# Patient Record
Sex: Male | Born: 1965 | Race: White | Hispanic: No | Smoking: Current every day smoker
Health system: Southern US, Community
[De-identification: ages and names within clinical notes are randomized; demographics above are authoritative.]

## PROBLEM LIST (undated history)

## (undated) DIAGNOSIS — F410 Panic disorder [episodic paroxysmal anxiety] without agoraphobia: Secondary | ICD-10-CM

## (undated) DIAGNOSIS — D751 Secondary polycythemia: Secondary | ICD-10-CM

## (undated) DIAGNOSIS — K219 Gastro-esophageal reflux disease without esophagitis: Secondary | ICD-10-CM

## (undated) DIAGNOSIS — I38 Endocarditis, valve unspecified: Secondary | ICD-10-CM

## (undated) DIAGNOSIS — J189 Pneumonia, unspecified organism: Secondary | ICD-10-CM

## (undated) HISTORY — PX: SKIN SURGERY: SHX2413

## (undated) HISTORY — PX: HERNIA REPAIR: SHX51

## (undated) HISTORY — PX: TONSILLECTOMY AND ADENOIDECTOMY: SUR1326

## (undated) HISTORY — DX: Endocarditis, valve unspecified: I38

## (undated) HISTORY — PX: TUMOR REMOVAL: SHX12

---
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
## 2007-07-15 ENCOUNTER — Emergency Department: Payer: Self-pay | Admitting: Emergency Medicine

## 2008-04-27 ENCOUNTER — Emergency Department: Payer: Self-pay | Admitting: Emergency Medicine

## 2009-09-23 ENCOUNTER — Ambulatory Visit: Payer: Self-pay | Admitting: Internal Medicine

## 2009-09-25 ENCOUNTER — Ambulatory Visit: Payer: Self-pay | Admitting: Internal Medicine

## 2010-11-04 ENCOUNTER — Ambulatory Visit: Payer: Self-pay | Admitting: Family Medicine

## 2014-10-03 ENCOUNTER — Ambulatory Visit: Payer: Self-pay | Admitting: Family Medicine

## 2015-04-12 ENCOUNTER — Encounter: Payer: Self-pay | Admitting: Gynecology

## 2015-04-12 ENCOUNTER — Ambulatory Visit
Admission: EM | Admit: 2015-04-12 | Discharge: 2015-04-12 | Disposition: A | Discharge status: Home / Self Care | Payer: BC Managed Care – PPO | Attending: Family Medicine | Admitting: Family Medicine

## 2015-04-12 DIAGNOSIS — I776 Arteritis, unspecified: Secondary | ICD-10-CM

## 2015-04-12 DIAGNOSIS — M2550 Pain in unspecified joint: Secondary | ICD-10-CM | POA: Diagnosis not present

## 2015-04-12 DIAGNOSIS — R21 Rash and other nonspecific skin eruption: Secondary | ICD-10-CM | POA: Diagnosis not present

## 2015-04-12 HISTORY — DX: Gastro-esophageal reflux disease without esophagitis: K21.9

## 2015-04-12 LAB — CBC WITH DIFFERENTIAL/PLATELET
Basophils Absolute: 0 10*3/uL (ref 0–0.1)
Basophils Relative: 1 %
EOS ABS: 0.5 10*3/uL (ref 0–0.7)
EOS PCT: 6 %
HCT: 51.5 % (ref 40.0–52.0)
Hemoglobin: 17.8 g/dL (ref 13.0–18.0)
LYMPHS ABS: 1.3 10*3/uL (ref 1.0–3.6)
LYMPHS PCT: 17 %
MCH: 30.9 pg (ref 26.0–34.0)
MCHC: 34.6 g/dL (ref 32.0–36.0)
MCV: 89.3 fL (ref 80.0–100.0)
MONO ABS: 0.5 10*3/uL (ref 0.2–1.0)
MONOS PCT: 7 %
Neutro Abs: 5.2 10*3/uL (ref 1.4–6.5)
Neutrophils Relative %: 69 %
PLATELETS: 349 10*3/uL (ref 150–440)
RBC: 5.76 MIL/uL (ref 4.40–5.90)
RDW: 13 % (ref 11.5–14.5)
WBC: 7.5 10*3/uL (ref 3.8–10.6)

## 2015-04-12 LAB — SEDIMENTATION RATE: SED RATE: 1 mm/h (ref 0–15)

## 2015-04-12 MED ORDER — PREDNISONE 10 MG (21) PO TBPK
ORAL_TABLET | ORAL | Status: DC
Start: 2015-04-12 — End: 2015-09-24

## 2015-04-12 MED ORDER — LORATADINE 10 MG PO TABS: 10.0000 mg | ORAL_TABLET | Freq: Every day | ORAL | Status: DC

## 2015-04-12 NOTE — ED Notes (Signed)
Patient c/o bilateral palm of hands itching x yesterday. Pt. Also stated joints pain in thumbs. Pt. C/o x 2 weeks ago had rash on arms which has disappear.

## 2015-04-12 NOTE — Discharge Instructions (Signed)
Hives Hives are itchy, red, puffy (swollen) areas of the skin. Hives can change in size and location on your body. Hives can come and go for hours, days, or weeks. Hives do not spread from person to person (noncontagious). Scratching, exercise, and stress can make your hives worse. HOME CARE  Avoid things that cause your hives (triggers).  Take antihistamine medicines as told by your doctor. Do not drive while taking an antihistamine.  Take any other medicines for itching as told by your doctor.  Wear loose-fitting clothing.  Keep all doctor visits as told. GET HELP RIGHT AWAY IF:   You have a fever.  Your tongue or lips are puffy.  You have trouble breathing or swallowing.  You feel tightness in the throat or chest.  You have belly (abdominal) pain.  You have lasting or severe itching that is not helped by medicine.  You have painful or puffy joints. These problems may be the first sign of a life-threatening allergic reaction. Call your local emergency services (911 in [place] MAKE SURE YOU:   Understand these instructions.  Will watch your condition.  Will get help right away if you are not doing well or get worse. Document Released: 04/27/2008 Document Revised: 01/18/2012 Document Reviewed: 10/12/2011 [facility] Patient Information 2015 [place], [place]. This information is not intended to replace advice given to you by your health care provider. Make sure you discuss any questions you have with your health care provider.  Arthralgia Arthralgia is joint pain. A joint is a place where two bones meet. Joint pain can happen for many reasons. The joint can be bruised, stiff, infected, or weak from aging. Pain usually goes away after resting and taking medicine for soreness.  HOME CARE  Rest the joint as told by your doctor.  Keep the sore joint raised (elevated) for the first 24 hours.  Put ice on the joint area.  Put ice in a plastic bag.  Place a towel between your  skin and the bag.  Leave the ice on for 15-20 minutes, 03-04 times a day.  Wear your splint, casting, elastic bandage, or sling as told by your doctor.  Only take medicine as told by your doctor. Do not take aspirin.  Use crutches as told by your doctor. Do not put weight on the joint until told to by your doctor. GET HELP RIGHT AWAY IF:   You have bruising, puffiness (swelling), or more pain.  Your fingers or toes turn blue or start to lose feeling (numb).  Your medicine does not lessen the pain.  Your pain becomes severe.  You have a temperature by mouth above 102 F (38.9 C), not controlled by medicine.  You cannot move or use the joint. MAKE SURE YOU:   Understand these instructions.  Will watch your condition.  Will get help right away if you are not doing well or get worse. Document Released: 07/07/2009 Document Revised: 10/11/2011 Document Reviewed: 07/07/2009 [facility] Patient Information 2015 [place], [place]. This information is not intended to replace advice given to you by your health care provider. Make sure you discuss any questions you have with your health care provider.  Vasculitis Vasculitis is when your blood vessels are inflamed. There are many different blood vessels in the body, and vasculitis can affect any of them. This includes large (veins and arteries) and small (capillaries) vessels. With vasculitis,   Blood vessel walls can become thick.  Blood vessels can become narrow.  Blood vessels can become weak. Sometimes, it  becomes so weak that the blood vessel bulges out like a balloon. This is called an aneurysm. Aneurysms are rare but can be life-threatening.  Scarring can occur.  Not enough blood can flow through the blood vessels. All of these things can damage many parts of the body, including the muscles, kidneys, lungs and brain. There are many types of vasculitis. Some types are short-term (acute), while others are long-term (chronic). Some  types may go away without treatment, and others may need to be treated for a long time.  CAUSES  Vasculitis occurs when the body's immune system (which fights germs and disease) makes a mistake. It attacks its own blood vessels. This causes inflammation (the body's way of reacting to injury or infection).   Why this happens is usually not known. The condition is then called primary vasculitis.  Sometimes, something triggers the inflammation. This is called secondary vasculitis. Possible causes include:  Infections.  An immune system disease. Examples include lupus, rheumatoid arthritis and scleroderma.  An allergic reaction to a medicine.  Cancer that affects blood cells. This includes leukemia and lymphoma.  Males and females of all ages and races can develop vasculitis. Some risk factors make vasculitis more likely. These include:  Smoking.  Stress.  Physical injury. SYMPTOMS  There are more than 20 types of vasculitis. Symptoms of each type vary, but some symptoms are common.  Many people with vasculitis:  Have a fever.  Do not feel like eating.  Lose weight.  Feel very tired.  Have aches and pains.  Feel weak.  Start to not have feeling (numbness) in an area.  Symptoms for some types of vasculitis also could be:  Sores in the mouth or eyes.  Skin problems. This could be sores, spots or rashes.  Trouble seeing.  Trouble breathing.  Blood in the urine.  Headaches.  Pain in the abdomen.  Stuffy or bloody nose. DIAGNOSIS  Vasculitis symptoms are similar to symptoms of many other conditions. That can make it hard to tell if you have vasculitis. To be sure, your caregiver will ask about your symptoms and do a physical exam. Certain tests may be necessary, such as:   A complete blood count (CBC). This test shows how many red blood cells are in your blood. Not having enough red blood cells (anemic) can result from vasculitis.  Erythrocyte sedimentation  (also called sed rate test). It measures inflammation in the body.  C reactive protein (CRP). This also shows if there is inflammation.  Anti-neutrophil cytoplasmic antibodies (ANCA). This can tell if the immune system is reacting to certain cells in the blood.  A urine test. This checks for blood or protein in the urine. That could be a sign of kidney damage from vasculitis.  Imaging tests. These tests create pictures from inside the body. Options include:  X-rays.  Computed tomography (CT) scan. This uses X-rays guided by a computer.  Ultrasound. It creates an image using sound waves.  Magnetic resonance imaging (MRI). It uses radio waves, magnets and a computer.  Angiography. A dye is put into your blood vessels. Then, an X-ray is taken of them.  A biopsy of a blood vessel. This means your caregiver will take out a small piece of a blood vessel. Then, it is checked under a microscope. This is an important test. It often is the best way to know for sure if you have vasculitis. TREATMENT   Treatment will depend on the type of vasculitis and how severe it  is. Often, you will need to see a specialist in immunologic diseases (rheumatologist).  Some types of vasculitis may go away without treatment.  Some types need only over-the-counter drugs.  Prescription medicines are used to treat many types of vasculitis. For example:  Corticosteroids. These are the drugs used most often. They are very powerful. Usually, a high dose is taken until symptoms improve. Then, the dose is gradually decreased. Using corticosteroids for a long time can cause problems. They can make muscles and bones weak. They can cause blood pressure to go up, and cause diabetes. Also, people often gain weight when they take corticosteroids.  Cytotoxic drugs. These kill cells that cause inflammation. Sometimes, they are used if corticosteroids do not help. Other times, both medications are taken.  Surgery. This may be  needed to repair a blood vessel that has bulged out (aneurysm).  Treatment can sometimes cure your disease. Other times, it can put the disease in remission (no symptoms). Increased treatment and reevaluation might be necessary if your disease comes back or flares. HOME CARE INSTRUCTIONS   Take any medications that your caregiver prescribes. Follow the directions carefully.  Watch for any problems that can be caused by a drug (side effects). Tell your caregiver right away if you notice any changes or problems.  Keep all appointments for checkups. This is important to help your caregiver watch for side effects. Checkups may include:  Periodic blood tests.  Bone density testing. This checks how strong or weak your bones are.  Blood pressure checks. If your blood pressure rises, you may need to take a drug to control it while you are taking corticosteroids.  Blood sugar checks. This is to be sure you are not developing diabetes. If you have diabetes, corticosteroid medications may make it worse and require increased treatment.  Exercise. First, talk with your caregiver about what would be OK for you to do. Aerobic exercise (which increases your heart rate) is often suggested. It includes walking. This type of exercise is good because it helps prevent bone loss. It also helps control your blood pressure.  Follow a healthy diet. Include good sources of protein in your diet. Also include fruits, vegetables and whole grains. Your caregiver can refer you to an expert on healthy eating (dietitian) for more detailed advice.  Learn as much as you can about vasculitis. Understanding your condition can help you cope with it. Coping can be hard because this may be something you will have to live with for years.  Consider joining a support group. It often helps to talk about your worries with others who have the same problems.  Tell your caregiver if you feel stressed, anxious or depressed. Your  caregiver may refer you to a specialist, or recommend medication to relieve your symptoms. SEEK MEDICAL CARE IF:   The symptoms that led to your diagnosis return.  You develop worsening fever, fatigue, headache, weight loss or pain in your jaw.  You develop signs of infection. Infections can be worse if you are on corticosteroid medication.  You develop any new or unexplained symptoms of disease. SEEK IMMEDIATE MEDICAL CARE IF:   Your eyesight changes.  Pain does not go away, even after taking medication.  You feel pain in your chest or abdomen.  You have trouble breathing.  One side of your face or body becomes suddenly weak or numb.  Your nose bleeds.  There is blood in your urine.  You develop a fever of more than 102 F (  38.9 C). Document Released: 05/15/2009 Document Revised: 10/11/2011 Document Reviewed: 09/12/2013 [facility] Patient Information 2015 [place], [place]. This information is not intended to replace advice given to you by your health care provider. Make sure you discuss any questions you have with your health care provider.

## 2015-04-12 NOTE — ED Provider Notes (Signed)
CSN: [id number]     Arrival date & time 04/12/15  0840 History   First MD Initiated Contact with Patient 04/12/15 [phone]     Chief Complaint  Patient presents with  . Pruritis    Patient reports having a rash on the upper arm lower arms upper and lower legs as well which started about Tuesday. He states was a very fine rash nothing was draining but the rash was pruritic. States he was concerned was his possible cyst soap powder detergents or got new detergent washed all his clothes again. He also states the weekend before he helped his nephew moved and was exposed to some of his dogs and other animals in his house but he's never had trouble with being allergic to animals or animal hair. The lesion on his arms and legs and finally cleared but then yesterday he broke out in a rash on the palm of his hand was very pruritic and red. For some swelling of his hand as well and some discomfort now (Consider location/radiation/quality/duration/timing/severity/associated sxs/prior Treatment) Patient is a 49 y.o. male presenting with rash. The history is provided by the patient. No language interpreter was used.  Rash Location:  Shoulder/arm, leg and hand Shoulder/arm rash location:  L shoulder, R shoulder, L arm, R arm, L upper arm, R upper arm, L forearm, R forearm, L hand and R hand Hand rash location:  L palm and R palm Leg rash location:  L upper leg, L knee, L lower leg and R lower leg Quality: itchiness and redness   Severity:  Moderate Progression:  Partially resolved Chronicity:  New Context: animal contact and new detergent/soap   Context: not chemical exposure, not exposure to similar rash, not food, not insect bite/sting, not medications and not plant contact   Ineffective treatments:  Antihistamines Associated symptoms: no abdominal pain, no diarrhea, no headaches, no sore throat, no throat swelling, no tongue swelling, no URI and not wheezing     Past Medical History  Diagnosis Date  .  GERD (gastroesophageal reflux disease)    History reviewed. No pertinent past surgical history. History reviewed. No pertinent family history. Social History  Substance Use Topics  . Smoking status: Current Every Day Smoker -- 1.00 packs/day    Types: Cigarettes  . Smokeless tobacco: None  . Alcohol Use: Yes    Review of Systems  Constitutional: Negative for activity change.  HENT: Negative for sore throat.   Respiratory: Negative for wheezing.   Gastrointestinal: Negative for abdominal pain and diarrhea.  Skin: Positive for rash.  Allergic/Immunologic: Positive for environmental allergies.  Neurological: Negative for headaches.  Psychiatric/Behavioral: The patient is nervous/anxious.     Allergies  Review of patient's allergies indicates no known allergies.  Home Medications   Prior to Admission medications   Medication Sig Start Date End Date Taking? Authorizing Provider  RaNITidine HCl (ZANTAC PO) Take by mouth.   Yes Historical Provider, MD  loratadine (CLARITIN) 10 MG tablet Take 1 tablet (10 mg total) by mouth daily. Take 1 tablet in the morning. As needed for itching. 04/12/15   Frederich Cha, MD  predniSONE (STERAPRED UNI-PAK 21 TAB) 10 MG (21) TBPK tablet Sig 6 tablet day 1, 5 tablets day 2, 4 tablets day 3,,3tablets day 4, 2 tablets day 5, 1 tablet day 6 take all tablets orally 04/12/15   Frederich Cha, MD   Meds Ordered and Administered this Visit  Medications - No data to display  BP 134/91 mmHg  Pulse 83  Temp(Src) 98 F (36.7 C) (Oral)  Resp 16  Ht 6' (1.829 m)  Wt 210 lb (95.255 kg)  BMI 28.47 kg/m2  SpO2 97% No data found.   Physical Exam  Constitutional: He is oriented to person, place, and time. He appears well-developed and well-nourished.  HENT:  Head: Normocephalic.  Eyes: Pupils are equal, round, and reactive to light.  Musculoskeletal: Normal range of motion. He exhibits no edema.  Neurological: He is alert and oriented to person, place, and  time. No cranial nerve deficit.  Skin: Skin is warm. Rash noted. There is erythema.     He has erythematous rash on the hand appears to be of vasculitis in nature. There is a few lesions were hand complete dyshidrosis. The rest of his arm legs no appreciable rash was noted at this time. Which corresponds with his history that the rash on arms and legs have gone. She be at this swelling of the hand as well which may be the cause of the of the discomfort he is feeling.  Psychiatric: His speech is normal and behavior is normal. Thought content normal. His mood appears anxious.  Vitals reviewed.   ED Course  Procedures (including critical care time)  Labs Review Labs Reviewed  CBC WITH DIFFERENTIAL/PLATELET  SEDIMENTATION RATE  ANTINUCLEAR ANTIBODIES, IFA  RPR    Imaging Review No results found.   Visual Acuity Review  Right Eye Distance:   Left Eye Distance:   Bilateral Distance:    Right Eye Near:   Left Eye Near:    Bilateral Near:         MDM   1. Rash   2. Vasculitis   3. Arthralgia     I tried to explain to patient what appears to have a vasculitis of his hands. The question is whether this is some type of allergic reaction is resolving and this last stages is causing the swelling of his hand which is probably causing the joint pain as well or whether there is some type of arthritic or infectious nature going on. Will obtain sedimentation rate RPR and a. We'll place him on 6 day course of prednisone. He reports planning to use C1 the doctors in the building to establish his PCP and yearly physical examination Jomarie Longs they may need a dermatology or rheumatology referral depending on the lab work. Also recommend start taking Benadryl to take Claritin 10 mg for itching.   Frederich Cha, MD 04/12/15 [phone]

## 2015-04-14 LAB — RPR: RPR: NONREACTIVE

## 2015-04-14 LAB — ANTINUCLEAR ANTIBODIES, IFA: ANTINUCLEAR ANTIBODIES, IFA: NEGATIVE

## 2015-09-24 ENCOUNTER — Encounter: Payer: Self-pay | Admitting: Family Medicine

## 2015-09-24 ENCOUNTER — Emergency Department
Admission: EM | Admit: 2015-09-24 | Discharge: 2015-09-24 | Disposition: A | Discharge status: Home / Self Care | Payer: BC Managed Care – PPO | Attending: Emergency Medicine | Admitting: Emergency Medicine

## 2015-09-24 ENCOUNTER — Encounter: Payer: Self-pay | Admitting: Emergency Medicine

## 2015-09-24 ENCOUNTER — Ambulatory Visit (INDEPENDENT_AMBULATORY_CARE_PROVIDER_SITE_OTHER): Payer: BC Managed Care – PPO | Admitting: Family Medicine

## 2015-09-24 ENCOUNTER — Emergency Department: Payer: BC Managed Care – PPO

## 2015-09-24 VITALS — BP 122/86 | HR 62 | Temp 98.3°F | Resp 16 | Ht 72.0 in | Wt 210.0 lb

## 2015-09-24 DIAGNOSIS — Z7952 Long term (current) use of systemic steroids: Secondary | ICD-10-CM | POA: Diagnosis not present

## 2015-09-24 DIAGNOSIS — E663 Overweight: Secondary | ICD-10-CM | POA: Diagnosis not present

## 2015-09-24 DIAGNOSIS — Z720 Tobacco use: Secondary | ICD-10-CM

## 2015-09-24 DIAGNOSIS — Z23 Encounter for immunization: Secondary | ICD-10-CM | POA: Diagnosis not present

## 2015-09-24 DIAGNOSIS — J302 Other seasonal allergic rhinitis: Secondary | ICD-10-CM | POA: Diagnosis not present

## 2015-09-24 DIAGNOSIS — K219 Gastro-esophageal reflux disease without esophagitis: Secondary | ICD-10-CM | POA: Insufficient documentation

## 2015-09-24 DIAGNOSIS — F17210 Nicotine dependence, cigarettes, uncomplicated: Secondary | ICD-10-CM | POA: Insufficient documentation

## 2015-09-24 DIAGNOSIS — R0789 Other chest pain: Secondary | ICD-10-CM

## 2015-09-24 DIAGNOSIS — F410 Panic disorder [episodic paroxysmal anxiety] without agoraphobia: Secondary | ICD-10-CM | POA: Diagnosis not present

## 2015-09-24 DIAGNOSIS — K4090 Unilateral inguinal hernia, without obstruction or gangrene, not specified as recurrent: Secondary | ICD-10-CM | POA: Diagnosis not present

## 2015-09-24 DIAGNOSIS — R079 Chest pain, unspecified: Secondary | ICD-10-CM | POA: Insufficient documentation

## 2015-09-24 DIAGNOSIS — F17200 Nicotine dependence, unspecified, uncomplicated: Secondary | ICD-10-CM | POA: Insufficient documentation

## 2015-09-24 DIAGNOSIS — Z833 Family history of diabetes mellitus: Secondary | ICD-10-CM | POA: Diagnosis not present

## 2015-09-24 DIAGNOSIS — R05 Cough: Secondary | ICD-10-CM | POA: Diagnosis present

## 2015-09-24 DIAGNOSIS — Z8249 Family history of ischemic heart disease and other diseases of the circulatory system: Secondary | ICD-10-CM | POA: Diagnosis not present

## 2015-09-24 DIAGNOSIS — R0600 Dyspnea, unspecified: Secondary | ICD-10-CM

## 2015-09-24 HISTORY — DX: Panic disorder (episodic paroxysmal anxiety): F41.0

## 2015-09-24 LAB — BASIC METABOLIC PANEL
ANION GAP: 7 (ref 5–15)
BUN: 11 mg/dL (ref 6–20)
CALCIUM: 8.7 mg/dL — AB (ref 8.9–10.3)
CO2: 23 mmol/L (ref 22–32)
Chloride: 108 mmol/L (ref 101–111)
Creatinine, Ser: 0.87 mg/dL (ref 0.61–1.24)
Glucose, Bld: 120 mg/dL — ABNORMAL HIGH (ref 65–99)
Potassium: 3.8 mmol/L (ref 3.5–5.1)
Sodium: 138 mmol/L (ref 135–145)

## 2015-09-24 LAB — TROPONIN I: TROPONIN I: 0.05 ng/mL — AB (ref <0.031)

## 2015-09-24 LAB — CBC
HCT: 55.4 % — ABNORMAL HIGH (ref 40.0–52.0)
HEMOGLOBIN: 18.8 g/dL — AB (ref 13.0–18.0)
MCH: 30.2 pg (ref 26.0–34.0)
MCHC: 34 g/dL (ref 32.0–36.0)
MCV: 88.8 fL (ref 80.0–100.0)
Platelets: 306 10*3/uL (ref 150–440)
RBC: 6.23 MIL/uL — AB (ref 4.40–5.90)
RDW: 14.1 % (ref 11.5–14.5)
WBC: 8.9 10*3/uL (ref 3.8–10.6)

## 2015-09-24 LAB — FIBRIN DERIVATIVES D-DIMER (ARMC ONLY): Fibrin derivatives D-dimer (ARMC): 328 (ref 0–499)

## 2015-09-24 MED ORDER — LORATADINE 10 MG PO TABS: 10.0000 mg | ORAL_TABLET | Freq: Every day | ORAL | Status: DC | PRN

## 2015-09-24 MED ORDER — LORAZEPAM 1 MG PO TABS: 1.0000 mg | ORAL_TABLET | Freq: Three times a day (TID) | ORAL | Status: DC | PRN

## 2015-09-24 NOTE — ED Provider Notes (Signed)
[facility] Emergency Department Provider Note  ____________________________________________  Time seen: 7:20 AM  I have reviewed the triage vital signs and the nursing notes.   HISTORY  Chief Complaint Shortness of Breath; Anxiety; and Cough      HPI Harold Carroll is a 50 y.o. male with no past medical history smokes a pack and a half cigars per day presents with awakening at 3:00 AM with difficulty breathing stating that he had a coughing spell followed by "difficulty catching my breath". Patient states that the episode lasted approximately 5-10 minutes. Patient describes it as feeling like a "panic attack". Patient admits to one previous event that was similar to this years ago. Patient denied any chest pain during this time. At present patient has no complaints. In addition patient denies any leg pain or swelling no history of coronary artery disease. Of note the patient's father had a myocardial infarction at the age of 3 and then subsequently at the age of 61.     Past Medical History  Diagnosis Date  . GERD (gastroesophageal reflux disease)   . Panic attacks     There are no active problems to display for this patient.   Past Surgical History  Procedure Laterality Date  . Tumor removal      Current Outpatient Rx  Name  Route  Sig  Dispense  Refill  . loratadine (CLARITIN) 10 MG tablet   Oral   Take 1 tablet (10 mg total) by mouth daily. Take 1 tablet in the morning. As needed for itching.   30 tablet   0   . predniSONE (STERAPRED UNI-PAK 21 TAB) 10 MG (21) TBPK tablet      Sig 6 tablet day 1, 5 tablets day 2, 4 tablets day 3,,3tablets day 4, 2 tablets day 5, 1 tablet day 6 take all tablets orally   21 tablet   0   . RaNITidine HCl (ZANTAC PO)   Oral   Take by mouth.           Allergies No known drug allergies No family history on file.  Social History Social History  Substance Use Topics  . Smoking status:  Current Every Day Smoker -- 1.00 packs/day    Types: Cigarettes  . Smokeless tobacco: None  . Alcohol Use: 4.8 oz/week    8 Standard drinks or equivalent per week    Review of Systems  Constitutional: Negative for fever. Eyes: Negative for visual changes. ENT: Negative for sore throat. Cardiovascular: Negative for chest pain. Respiratory: Negative for shortness of breath. Gastrointestinal: Negative for abdominal pain, vomiting and diarrhea. Genitourinary: Negative for dysuria. Musculoskeletal: Negative for back pain. Skin: Negative for rash. Neurological: Negative for headaches, focal weakness or numbness. Psychiatric: Positive for anxiety  10-point ROS otherwise negative.  ____________________________________________   PHYSICAL EXAM:  VITAL SIGNS: ED Triage Vitals  Enc Vitals Group     BP 09/24/15 0501 150/94 mmHg     Pulse Rate 09/24/15 0501 80     Resp 09/24/15 0501 20     Temp 09/24/15 0501 97.8 F (36.6 C)     Temp Source 09/24/15 0501 Oral     SpO2 09/24/15 0501 98 %     Weight 09/24/15 0501 205 lb (92.987 kg)     Height 09/24/15 0501 6' (1.829 m)     Head Cir --      Peak Flow --      Pain Score --      Pain  Loc --      Pain Edu? --      Excl. in [place]? --      Constitutional: Alert and oriented. Well appearing and in no distress. Eyes: Conjunctivae are normal. PERRL. Normal extraocular movements. ENT   Head: Normocephalic and atraumatic.   Nose: No congestion/rhinnorhea.   Mouth/Throat: Mucous membranes are moist.   Neck: No stridor. Hematological/Lymphatic/Immunilogical: No cervical lymphadenopathy. Cardiovascular: Normal rate, regular rhythm. Normal and symmetric distal pulses are present in all extremities. No murmurs, rubs, or gallops. Respiratory: Normal respiratory effort without tachypnea nor retractions. Breath sounds are clear and equal bilaterally. No wheezes/rales/rhonchi. Gastrointestinal: Soft and nontender. No distention. There  is no CVA tenderness. Genitourinary: deferred Musculoskeletal: Nontender with normal range of motion in all extremities. No joint effusions.  No lower extremity tenderness nor edema. Neurologic:  Normal speech and language. No gross focal neurologic deficits are appreciated. Speech is normal.  Skin:  Skin is warm, dry and intact. No rash noted. Psychiatric: Mood and affect are normal. Speech and behavior are normal. Patient exhibits appropriate insight and judgment.  ____________________________________________    LABS (pertinent positives/negatives)  Labs Reviewed  BASIC METABOLIC PANEL - Abnormal; Notable for the following:    Glucose, Bld 120 (*)    Calcium 8.7 (*)    All other components within normal limits  CBC - Abnormal; Notable for the following:    RBC 6.23 (*)    Hemoglobin 18.8 (*)    HCT 55.4 (*)    All other components within normal limits  TROPONIN I - Abnormal; Notable for the following:    Troponin I 0.05 (*)    All other components within normal limits  TROPONIN I  FIBRIN DERIVATIVES D-DIMER ([facility] ONLY)     ____________________________________________   EKG  ED ECG REPORT I, BROWN, Blum N, the attending physician, personally viewed and interpreted this ECG.   Date: 09/24/2015  EKG Time: 5:04 AM  Rate: 74  Rhythm: Normal sinus rhythm  Axis: Normal  Intervals: Normal  ST&T Change:    ____________________________________________    RADIOLOGY     DG Chest 2 View (Final result) Result time: 09/24/15 05:40:43   Final result by Rad Results In Interface (09/24/15 05:40:43)   Narrative:   CLINICAL DATA: Acute onset of cough and shortness of breath. Initial encounter.  EXAM: CHEST 2 VIEW  COMPARISON: None.  FINDINGS: The lungs are well-aerated. Mild peribronchial thickening is noted. There is no evidence of focal opacification, pleural effusion or pneumothorax.  The heart is normal in size; the mediastinal contour is  within normal limits. No acute osseous abnormalities are seen.  IMPRESSION: Mild peribronchial thickening noted. Lungs otherwise clear.   Electronically Signed By: Garald Balding M.D. On: 09/24/2015 05:40       INITIAL IMPRESSION / ASSESSMENT AND PLAN / ED COURSE  Pertinent labs & imaging results that were available during my care of the patient were reviewed by me and considered in my medical decision making (see chart for details).  History of physical exam consistent with possible panic attack patient stating that he is currently under fair amount of stress secondary to "issues with this child's mother". Initial cardiac enzymes 0.05 however subsequent cardiac enzyme less than 0.03 in a patient with no chest pain or shortness of breath or any other symptoms at this time. However given patient's paternal family history we'll have the patient follow-up with cardiology for possible outpatient stress test.  ____________________________________________   FINAL CLINICAL IMPRESSION(S) / ED DIAGNOSES  Final diagnoses:  Panic attack  Dyspnea      Gregor Hams, MD 09/24/15 [phone]

## 2015-09-24 NOTE — Progress Notes (Signed)
Date:  09/24/2015   Name:  Harold Carroll   DOB:  23-Mar-1966   MRN:  [id number]  PCP:  No PCP Per Patient    Chief Complaint: Establish Care and Hernia   History of Present Illness:  This is a 50 y.o. male seen in ED this AM for CP/SOB, felt to be panic attack (had similar episode in 2003) but EKF showed LAD and cards referral for stress testing recommended. Reports L inguinal hernia since September, getting bigger, hurts to strain. Takes Zantac daily for GERD and Claritin prn for allergies. Father died 8 MI, had first MI at 21, also had DM. Mother with hypothyroidism. Tetanus status unknown, declines flu imm. Has quit smoking several times in past.  Review of Systems:  Review of Systems  Constitutional: Negative for fever.  HENT: Negative for ear pain and sore throat.   Eyes: Negative for pain.  Respiratory: Negative for cough.   Cardiovascular: Negative for leg swelling.  Gastrointestinal: Negative for abdominal pain.  Endocrine: Negative for polyuria.  Genitourinary: Negative for difficulty urinating.  Neurological: Negative for syncope and light-headedness.    Patient Active Problem List   Diagnosis Date Noted  . Overweight (BMI 25.0-29.9) 09/24/2015  . Smoker 09/24/2015  . Left inguinal hernia 09/24/2015  . Chest pain of uncertain etiology [id number]  . Panic disorder 09/24/2015  . FH: heart disease 09/24/2015  . FH: diabetes mellitus 09/24/2015    Prior to Admission medications   Medication Sig Start Date End Date Taking? Authorizing Provider  loratadine (CLARITIN) 10 MG tablet Take 1 tablet (10 mg total) by mouth daily. Take 1 tablet in the morning. As needed for itching. 04/12/15  Yes Frederich Cha, MD  LORazepam (ATIVAN) 1 MG tablet Take 1 tablet (1 mg total) by mouth every 8 (eight) hours as needed for anxiety. 09/24/15 09/23/16 Yes Gregor Hams, MD  RaNITidine HCl (ZANTAC PO) Take by mouth.   Yes Historical Provider, MD    No Known Allergies  Past  Surgical History  Procedure Laterality Date  . Tumor removal      Social History  Substance Use Topics  . Smoking status: Current Every Day Smoker -- 1.00 packs/day    Types: Cigarettes  . Smokeless tobacco: None  . Alcohol Use: 4.8 oz/week    8 Standard drinks or equivalent per week    History reviewed. No pertinent family history.  Medication list has been reviewed and updated.  Physical Examination: BP 122/86 mmHg  Pulse 62  Temp(Src) 98.3 F (36.8 C)  Resp 16  Ht 6' (1.829 m)  Wt 210 lb (95.255 kg)  BMI 28.47 kg/m2  SpO2 97%  Physical Exam  Constitutional: He is oriented to person, place, and time. He appears well-developed and well-nourished.  HENT:  Head: Normocephalic and atraumatic.  Right Ear: External ear normal.  Left Ear: External ear normal.  Nose: Nose normal.  Mouth/Throat: Oropharynx is clear and moist.  TM's clear  Eyes: Conjunctivae and EOM are normal. Pupils are equal, round, and reactive to light. No scleral icterus.  Neck: Neck supple. No thyromegaly present.  Cardiovascular: Normal rate, regular rhythm and normal heart sounds.   Pulmonary/Chest: Effort normal and breath sounds normal.  Abdominal: Soft. He exhibits no distension and no mass. There is no tenderness.  Genitourinary: Penis normal.  Testes normal Reducible direct left inguinal hernia noted  Musculoskeletal: He exhibits no edema.  Lymphadenopathy:    He has no cervical adenopathy.  Neurological: He is alert and oriented  to person, place, and time. Coordination normal.  Skin: Skin is warm and dry.  Psychiatric: He has a normal mood and affect. His behavior is normal.  Nursing note and vitals reviewed.   Assessment and Plan:  1. Left inguinal hernia Progressive sxs x 5 months - Ambulatory referral to General Surgery  2. Chest pain of uncertain etiology With LAD on EKG - Ambulatory referral to Cardiology  3. Panic disorder Rare, intermittent  4. Overweight (BMI  25.0-29.9) Discussed exercise/weight loss - TSH - Vitamin D (25 hydroxy)  5. Smoker Encouraged cessation  6. Gastroesophageal reflux disease, esophagitis presence not specified Cont Zantac for now, consider taper  7. Seasonal allergic rhinitis Cont prn Claritin  8. FH: heart disease - Lipid Profile  9. FH: diabetes mellitus - HgB A1c  10. Need for Tdap vaccination - Tdap vaccine greater than or equal to 7yo IM   Return in about 4 weeks (around 10/22/2015).  Satira Anis. [facility]  09/24/2015

## 2015-09-24 NOTE — Discharge Instructions (Signed)
Shortness of Breath Shortness of breath means you have trouble breathing. It could also mean that you have a medical problem. You should get immediate medical care for shortness of breath. CAUSES   Not enough oxygen in the air such as with high altitudes or a smoke-filled room.  Certain lung diseases, infections, or problems.  Heart disease or conditions, such as angina or heart failure.  Low red blood cells (anemia).  Poor physical fitness, which can cause shortness of breath when you exercise.  Chest or back injuries or stiffness.  Being overweight.  Smoking.  Anxiety, which can make you feel like you are not getting enough air. DIAGNOSIS  Serious medical problems can often be found during your physical exam. Tests may also be done to determine why you are having shortness of breath. Tests may include:  Chest X-rays.  Lung function tests.  Blood tests.  An electrocardiogram (ECG).  An ambulatory electrocardiogram. An ambulatory ECG records your heartbeat patterns over a 24-hour period.  Exercise testing.  A transthoracic echocardiogram (TTE). During echocardiography, sound waves are used to evaluate how blood flows through your heart.  A transesophageal echocardiogram (TEE).  Imaging scans. Your health care provider may not be able to find a cause for your shortness of breath after your exam. In this case, it is important to have a follow-up exam with your health care provider as directed.  TREATMENT  Treatment for shortness of breath depends on the cause of your symptoms and can vary greatly. HOME CARE INSTRUCTIONS   Do not smoke. Smoking is a common cause of shortness of breath. If you smoke, ask for help to quit.  Avoid being around chemicals or things that may bother your breathing, such as paint fumes and dust.  Rest as needed. Slowly resume your usual activities.  If medicines were prescribed, take them as directed for the full length of time directed. This  includes oxygen and any inhaled medicines.  Keep all follow-up appointments as directed by your health care provider. SEEK MEDICAL CARE IF:   Your condition does not improve in the time expected.  You have a hard time doing your normal activities even with rest.  You have any new symptoms. SEEK IMMEDIATE MEDICAL CARE IF:   Your shortness of breath gets worse.  You feel light-headed, faint, or develop a cough not controlled with medicines.  You start coughing up blood.  You have pain with breathing.  You have chest pain or pain in your arms, shoulders, or abdomen.  You have a fever.  You are unable to walk up stairs or exercise the way you normally do. MAKE SURE YOU:  Understand these instructions.  Will watch your condition.  Will get help right away if you are not doing well or get worse.   This information is not intended to replace advice given to you by your health care provider. Make sure you discuss any questions you have with your health care provider.   Document Released: 04/13/2001 Document Revised: 07/24/2013 Document Reviewed: 10/04/2011 Elsevier Interactive Patient Education 2016 [employer].  Panic Attacks Panic attacks are sudden, short-livedsurges of severe anxiety, fear, or discomfort. They may occur for no reason when you are relaxed, when you are anxious, or when you are sleeping. Panic attacks may occur for a number of reasons:   Healthy people occasionally have panic attacks in extreme, life-threatening situations, such as war or natural disasters. Normal anxiety is a protective mechanism of the body that helps [place] react  to danger (fight or flight response).  Panic attacks are often seen with anxiety disorders, such as panic disorder, social anxiety disorder, generalized anxiety disorder, and phobias. Anxiety disorders cause excessive or uncontrollable anxiety. They may interfere with your relationships or other life activities.  Panic attacks are  sometimes seen with other mental illnesses, such as depression and posttraumatic stress disorder.  Certain medical conditions, prescription medicines, and drugs of abuse can cause panic attacks. SYMPTOMS  Panic attacks start suddenly, peak within 20 minutes, and are accompanied by four or more of the following symptoms:  Pounding heart or fast heart rate (palpitations).  Sweating.  Trembling or shaking.  Shortness of breath or feeling smothered.  Feeling choked.  Chest pain or discomfort.  Nausea or strange feeling in your stomach.  Dizziness, light-headedness, or feeling like you will faint.  Chills or hot flushes.  Numbness or tingling in your lips or hands and feet.  Feeling that things are not real or feeling that you are not yourself.  Fear of losing control or going crazy.  Fear of dying. Some of these symptoms can mimic serious medical conditions. For example, you may think you are having a heart attack. Although panic attacks can be very scary, they are not life threatening. DIAGNOSIS  Panic attacks are diagnosed through an assessment by your health care provider. Your health care provider will ask questions about your symptoms, such as where and when they occurred. Your health care provider will also ask about your medical history and use of alcohol and drugs, including prescription medicines. Your health care provider may order blood tests or other studies to rule out a serious medical condition. Your health care provider may refer you to a mental health professional for further evaluation. TREATMENT   Most healthy people who have one or two panic attacks in an extreme, life-threatening situation will not require treatment.  The treatment for panic attacks associated with anxiety disorders or other mental illness typically involves counseling with a mental health professional, medicine, or a combination of both. Your health care provider will help determine what  treatment is best for you.  Panic attacks due to physical illness usually go away with treatment of the illness. If prescription medicine is causing panic attacks, talk with your health care provider about stopping the medicine, decreasing the dose, or substituting another medicine.  Panic attacks due to alcohol or drug abuse go away with abstinence. Some adults need professional help in order to stop drinking or using drugs. HOME CARE INSTRUCTIONS   Take all medicines as directed by your health care provider.   Schedule and attend follow-up visits as directed by your health care provider. It is important to keep all your appointments. SEEK MEDICAL CARE IF:  You are not able to take your medicines as prescribed.  Your symptoms do not improve or get worse. SEEK IMMEDIATE MEDICAL CARE IF:   You experience panic attack symptoms that are different than your usual symptoms.  You have serious thoughts about hurting yourself or others.  You are taking medicine for panic attacks and have a serious side effect. MAKE SURE YOU:  Understand these instructions.  Will watch your condition.  Will get help right away if you are not doing well or get worse.   This information is not intended to replace advice given to you by your health care provider. Make sure you discuss any questions you have with your health care provider.   Document Released: 07/19/2005 Document Revised: 07/24/2013  Document Reviewed: 03/02/2013 Elsevier Interactive Patient Education [employer].

## 2015-09-24 NOTE — ED Notes (Addendum)
Patient states that around 03:00 he started coughing and became short of breath. Patient states that it last for about 5-10 minutes. Patient denies any chest pain.  Patient reports that it felt similar to previous panic attacks but that he has not had one in years.

## 2015-09-26 MED ORDER — ONDANSETRON HCL 4 MG/2ML IJ SOLN
INTRAMUSCULAR | Status: AC
  Filled 2015-09-26: qty 2

## 2015-09-26 MED ORDER — FENTANYL CITRATE (PF) 100 MCG/2ML IJ SOLN
INTRAMUSCULAR | Status: AC
  Filled 2015-09-26: qty 2

## 2015-09-29 ENCOUNTER — Ambulatory Visit: Payer: BC Managed Care – PPO | Admitting: General Surgery

## 2015-09-30 ENCOUNTER — Telehealth: Payer: Self-pay

## 2015-09-30 LAB — LIPID PANEL
Chol/HDL Ratio: 4.8 ratio units (ref 0.0–5.0)
Cholesterol, Total: 192 mg/dL (ref 100–199)
HDL: 40 mg/dL (ref >39)
LDL Calculated: 124 mg/dL — ABNORMAL HIGH (ref 0–99)
Triglycerides: 139 mg/dL (ref 0–149)
VLDL Cholesterol Cal: 28 mg/dL (ref 5–40)

## 2015-09-30 LAB — HEMOGLOBIN A1C
ESTIMATED AVERAGE GLUCOSE: 114 mg/dL
HEMOGLOBIN A1C: 5.6 % (ref 4.8–5.6)

## 2015-09-30 LAB — VITAMIN D 25 HYDROXY (VIT D DEFICIENCY, FRACTURES): VIT D 25 HYDROXY: 30 ng/mL (ref 30.0–100.0)

## 2015-09-30 LAB — TSH: TSH: 1.24 u[IU]/mL (ref 0.450–4.500)

## 2015-09-30 NOTE — Telephone Encounter (Signed)
Spoke with patient. Patient advised of all results and verbalized understanding. Will call back with any future questions or concerns. MAH  

## 2015-09-30 NOTE — Telephone Encounter (Signed)
-----   Message from Adline Potter, MD sent at 09/30/2015  8:42 AM EST ----- Inform all results normal.

## 2015-10-01 ENCOUNTER — Ambulatory Visit (INDEPENDENT_AMBULATORY_CARE_PROVIDER_SITE_OTHER): Payer: BC Managed Care – PPO | Admitting: General Surgery

## 2015-10-01 ENCOUNTER — Encounter: Payer: Self-pay | Admitting: General Surgery

## 2015-10-01 VITALS — BP 133/93 | HR 78 | Temp 98.3°F | Ht 72.0 in | Wt 209.0 lb

## 2015-10-01 DIAGNOSIS — K4090 Unilateral inguinal hernia, without obstruction or gangrene, not specified as recurrent: Secondary | ICD-10-CM

## 2015-10-01 NOTE — Patient Instructions (Addendum)

## 2015-10-02 DIAGNOSIS — E785 Hyperlipidemia, unspecified: Secondary | ICD-10-CM

## 2015-10-02 DIAGNOSIS — R079 Chest pain, unspecified: Secondary | ICD-10-CM | POA: Insufficient documentation

## 2015-10-02 DIAGNOSIS — E782 Mixed hyperlipidemia: Secondary | ICD-10-CM | POA: Insufficient documentation

## 2015-10-02 NOTE — Progress Notes (Signed)
Patient ID: Harold Carroll, male   DOB: Dec 11, 1965, 50 y.o.   MRN: [id number]  CC: Left groin bulge  HPI Harold Carroll is a 50 y.o. male who presents to clinic for evaluation of a left inguinal hernia. Patient states that approximately 9 months ago he started noticeable bulge the area that was easily reducible and does not bother him very much. Over the last month the area started to become larger and more symptomatic. It is a specialist back when he coughs or attempts to lift anything heavy. It is remained soft and he said normal bowel function without any nausea or vomiting. He denies any fevers, chills, chest pain, shortness of breath, chronic abdominal pains. He is here to discuss operative repair.  HPI  Past Medical History  Diagnosis Date  . GERD (gastroesophageal reflux disease)   . Panic attacks     Past Surgical History  Procedure Laterality Date  . Tumor removal    . Tonsillectomy and adenoidectomy    . Skin surgery      Tumor-benign  . Ventral hernia repair  1968    Dr. Mare Ferrari    Family History  Problem Relation Age of Onset  . Thyroid disease Mother   . Heart disease Father     heart attack  . Diabetes Father   . Lung cancer Maternal Aunt   . Cancer Maternal Aunt     lung cancer  . Heart disease Paternal Uncle   . Cancer Maternal Grandfather     lung cancer    Social History Social History  Substance Use Topics  . Smoking status: Current Every Day Smoker -- 1.00 packs/day    Types: Cigarettes  . Smokeless tobacco: None  . Alcohol Use: 4.8 oz/week    8 Standard drinks or equivalent per week    No Known Allergies  Current Outpatient Prescriptions  Medication Sig Dispense Refill  . loratadine (CLARITIN) 10 MG tablet Take 1 tablet (10 mg total) by mouth daily as needed for allergies. Take 1 tablet in the morning. As needed for itching. 30 tablet 0  . LORazepam (ATIVAN) 1 MG tablet Take 1 tablet (1 mg total) by mouth every 8 (eight)  hours as needed for anxiety. 4 tablet 0  . Multiple Vitamin (MULTIVITAMIN) capsule Take 1 capsule by mouth daily.    . ranitidine (ZANTAC) 75 MG tablet Take 75 mg by mouth 2 (two) times daily.     No current facility-administered medications for this visit.     Review of Systems A multi-point review of systems was asked and was negative except for the findings documented in the history of present illness  Physical Exam Blood pressure 133/93, pulse 78, temperature 98.3 F (36.8 C), temperature source Oral, height 6' (1.829 m), weight 94.802 kg (209 lb). CONSTITUTIONAL: No acute distress. EYES: Pupils are equal, round, and reactive to light, Sclera are non-icteric. EARS, NOSE, MOUTH AND THROAT: The oropharynx is clear. The oral mucosa is pink and moist. Hearing is intact to voice. LYMPH NODES:  Lymph nodes in the neck are normal. RESPIRATORY:  Lungs are clear. There is normal respiratory effort, with equal breath sounds bilaterally, and without pathologic use of accessory muscles. CARDIOVASCULAR: Heart is regular without murmurs, gallops, or rubs. GI: The abdomen is soft, nontender, and nondistended. There are is an obvious left inguinal hernia that is easily reducible. No evidence of right inguinal hernia on exam.. There is no hepatosplenomegaly. There are normal bowel sounds in all quadrants. GU: Rectal  deferred.   MUSCULOSKELETAL: Normal muscle strength and tone. No cyanosis or edema.   SKIN: Turgor is good and there are no pathologic skin lesions or ulcers. NEUROLOGIC: Motor and sensation is grossly normal. Cranial nerves are grossly intact. PSYCH:  Oriented to person, place and time. Affect is normal.  Data Reviewed Patient had recent labs that are all within normal limits I have personally reviewed the patient's imaging, laboratory findings and medical records.    Assessment    50 year old male with a symptomatic left inguinal hernia    Plan    Left inguinal hernia:  Discussed with the patient treatment options of open versus laparoscopic anal hernia repair the risks, benefits, alternatives of each were discussed in detail. After this long discussion the patient discussed that he thinks he prefer an open inguinal hernia repair as soon as possible. He then recanted instead actually I need to work for the next 2 weeks but then this was possible after the next 2 weeks. After referring to the schedule noted that the next operative possibility would be March 28 with my partner Dr. Azalee Course. Patient stated that would be acceptable and we will schedule him for surgery on March 28. Discussed with him that should he have any previous occurrences of chest pain or any new medical findings this no immediately. Otherwise he'll follow up with his primary care and is scheduled now for surgery on the 20th.     Time spent with the patient was 60 minutes, with more than 50% of the time spent in face-to-face education, counseling and care coordination.     Clayburn Pert, MD FACS General Surgeon 10/02/2015, 9:45 AM

## 2015-10-03 ENCOUNTER — Telehealth: Payer: Self-pay | Admitting: Surgery

## 2015-10-03 NOTE — Telephone Encounter (Signed)
Pt advised of pre op date/time and sx date. Sx: 10/28/15 With Dr Golden Circle left inguinal hernia repair.  Pre op: 10/14/15 between 9-1--Phone interview.   Patient made aware to call [phone], between 1-3:00pm the day before surgery, to find out what time to arrive.

## 2015-10-06 ENCOUNTER — Telehealth: Payer: Self-pay

## 2015-10-06 ENCOUNTER — Other Ambulatory Visit: Payer: Self-pay

## 2015-10-06 MED ORDER — NICOTINE 10 MG IN INHA: 1.0000 | RESPIRATORY_TRACT | Status: DC | PRN

## 2015-10-06 NOTE — Telephone Encounter (Signed)
Sent to [facility] 

## 2015-10-06 NOTE — Addendum Note (Signed)
Addended by: Adline Potter on: 10/06/2015 11:44 AM   Modules accepted: Orders

## 2015-10-14 ENCOUNTER — Inpatient Hospital Stay
Admission: RE | Admit: 2015-10-14 | Discharge: 2015-10-14 | Disposition: A | Discharge status: Home / Self Care | Payer: BC Managed Care – PPO | Source: Ambulatory Visit

## 2015-10-14 NOTE — Pre-Procedure Instructions (Signed)
REQUESTED STRESS/NOTE FROM [facility]

## 2015-10-14 NOTE — Patient Instructions (Signed)
  Your procedure is scheduled on: 10/28/15 Report to Day Surgery.  [facility] SECOND FLOOR To find out your arrival time please call [phone] between 1PM - 3PM on 10/27/15  Remember: Instructions that are not followed completely may result in serious medical risk, up to and including death, or upon the discretion of your surgeon and anesthesiologist your surgery may need to be rescheduled.    __X__ 1. Do not eat food or drink liquids after midnight. No gum chewing or hard candies.     __X__ 2. No Alcohol for 24 hours before or after surgery.   ____ 3. Bring all medications with you on the day of surgery if instructed.    __X__ 4. Notify your doctor if there is any change in your medical condition     (cold, fever, infections).     Do not wear jewelry, make-up, hairpins, clips or nail polish.  Do not wear lotions, powders, or perfumes. You may wear deodorant.  Do not shave 48 hours prior to surgery. Men may shave face and neck.  Do not bring valuables to the hospital.    [facility] is not responsible for any belongings or valuables.               Contacts, dentures or bridgework may not be worn into surgery.  Leave your suitcase in the car. After surgery it may be brought to your room.  For patients admitted to the hospital, discharge time is determined by your                treatment team.   Patients discharged the day of surgery will not be allowed to drive home.   Please read over the following fact sheets that you were given:   Surgical Site Infection Prevention   ____ Take these medicines the morning of surgery with A SIP OF WATER:    1. RANITIDINE  2.   3.   4.  5.  6.  ____ Fleet Enema (as directed)   __X__ Use CHG Soap as directed  ____ Use inhalers on the day of surgery  ____ Stop metformin 2 days prior to surgery    ____ Take 1/2 of usual insulin dose the night before surgery and none on the morning of surgery.   ____ Stop Coumadin/Plavix/aspirin on    ____ Stop Anti-inflammatories on    ____ Stop supplements until after surgery.    ____ Bring C-Pap to the hospital.

## 2015-10-22 ENCOUNTER — Ambulatory Visit (INDEPENDENT_AMBULATORY_CARE_PROVIDER_SITE_OTHER): Payer: BC Managed Care – PPO | Admitting: Family Medicine

## 2015-10-22 ENCOUNTER — Encounter: Payer: Self-pay | Admitting: Family Medicine

## 2015-10-22 VITALS — BP 120/82 | HR 72 | Temp 97.8°F | Resp 16 | Ht 70.0 in | Wt 215.0 lb

## 2015-10-22 DIAGNOSIS — J302 Other seasonal allergic rhinitis: Secondary | ICD-10-CM | POA: Diagnosis not present

## 2015-10-22 DIAGNOSIS — R0789 Other chest pain: Secondary | ICD-10-CM | POA: Diagnosis not present

## 2015-10-22 DIAGNOSIS — Z720 Tobacco use: Secondary | ICD-10-CM | POA: Diagnosis not present

## 2015-10-22 DIAGNOSIS — F17200 Nicotine dependence, unspecified, uncomplicated: Secondary | ICD-10-CM

## 2015-10-22 DIAGNOSIS — E663 Overweight: Secondary | ICD-10-CM | POA: Diagnosis not present

## 2015-10-22 DIAGNOSIS — F410 Panic disorder [episodic paroxysmal anxiety] without agoraphobia: Secondary | ICD-10-CM

## 2015-10-22 DIAGNOSIS — K219 Gastro-esophageal reflux disease without esophagitis: Secondary | ICD-10-CM

## 2015-10-22 DIAGNOSIS — K4090 Unilateral inguinal hernia, without obstruction or gangrene, not specified as recurrent: Secondary | ICD-10-CM | POA: Diagnosis not present

## 2015-10-22 DIAGNOSIS — R079 Chest pain, unspecified: Secondary | ICD-10-CM

## 2015-10-24 NOTE — Progress Notes (Signed)
Date:  10/22/2015   Name:  Harold Carroll   DOB:  1966-03-02   MRN:  [id number]  PCP:  Adline Potter, MD    Chief Complaint: Panic Attack   History of Present Illness:  This is a 50 y.o. male for one month f/u. Scheduled for [facility] repair next week. Saw cards and had negative stress test. No panic attack since last visit. Trying to taper Zantac use. Has quit smoking now for 3 weeks on Nicotrol inhaler. Weight up 3#. Blood work all ok.  Review of Systems:  Review of Systems  Constitutional: Negative for fever and fatigue.  Respiratory: Negative for cough and shortness of breath.   Cardiovascular: Negative for chest pain and leg swelling.  Endocrine: Negative for polyuria.  Genitourinary: Negative for difficulty urinating.  Neurological: Negative for syncope and light-headedness.    Patient Active Problem List   Diagnosis Date Noted  . Benign essential HTN 10/02/2015  . Chest pain 10/02/2015  . Combined fat and carbohydrate induced hyperlipemia 10/02/2015  . Overweight (BMI 25.0-29.9) 09/24/2015  . Smoker 09/24/2015  . Left inguinal hernia 09/24/2015  . Chest pain of uncertain etiology [id number]  . Panic disorder 09/24/2015  . FH: heart disease 09/24/2015  . FH: diabetes mellitus 09/24/2015  . GERD (gastroesophageal reflux disease) 09/24/2015  . Seasonal allergic rhinitis 09/24/2015    Prior to Admission medications   Medication Sig Start Date End Date Taking? Authorizing Provider  loratadine (CLARITIN) 10 MG tablet Take 1 tablet (10 mg total) by mouth daily as needed for allergies. Take 1 tablet in the morning. As needed for itching. 09/24/15  Yes Adline Potter, MD  LORazepam (ATIVAN) 1 MG tablet Take 1 tablet (1 mg total) by mouth every 8 (eight) hours as needed for anxiety. 09/24/15 09/23/16 Yes Gregor Hams, MD  Multiple Vitamin (MULTIVITAMIN) capsule Take 1 capsule by mouth daily.   Yes Historical Provider, MD  nicotine (NICOTROL) 10 MG inhaler Inhale 1 cartridge (1  continuous puffing total) into the lungs as needed for smoking cessation. 10/06/15  Yes Adline Potter, MD  ranitidine (ZANTAC) 75 MG tablet Take 150 mg by mouth 2 (two) times daily as needed.   Yes Historical Provider, MD    No Known Allergies  Past Surgical History  Procedure Laterality Date  . Tumor removal    . Tonsillectomy and adenoidectomy    . Skin surgery      Tumor-benign  . Ventral hernia repair  1968    Dr. Mare Ferrari    Social History  Substance Use Topics  . Smoking status: Former Smoker -- 1.00 packs/day    Types: Cigarettes    Start date: 10/22/1983    Quit date: 10/08/2015  . Smokeless tobacco: Never Used  . Alcohol Use: 4.8 oz/week    8 Standard drinks or equivalent per week    Family History  Problem Relation Age of Onset  . Thyroid disease Mother   . Heart disease Father     heart attack  . Diabetes Father   . Lung cancer Maternal Aunt   . Cancer Maternal Aunt     lung cancer  . Heart disease Paternal Uncle   . Cancer Maternal Grandfather     lung cancer    Medication list has been reviewed and updated.  Physical Examination: BP 120/82 mmHg  Pulse 72  Temp(Src) 97.8 F (36.6 C) (Oral)  Resp 16  Ht 5\' 10"  (1.778 m)  Wt 215 lb (97.523 kg)  BMI 30.85 kg/m2  SpO2 97%  Physical Exam  Constitutional: He appears well-developed and well-nourished.  Cardiovascular: Normal rate, regular rhythm and normal heart sounds.   Pulmonary/Chest: Effort normal and breath sounds normal.  Musculoskeletal: He exhibits no edema.  Neurological: He is alert.  Skin: Skin is warm and dry.  Psychiatric: He has a normal mood and affect. His behavior is normal.  Nursing note and vitals reviewed.   Assessment and Plan:  1. Left inguinal hernia For repair next week  2. Seasonal allergic rhinitis Well controlled on Claritin  3. Gastroesophageal reflux disease, esophagitis presence not specified Attempting Zantac taper  4. Overweight (BMI 25.0-29.9) Exercise  after hernia repair discussed  5. Smoker Off cigs x 3 weeks, encouraged continued cessation  6. Panic disorder Intermittent, well controlled on prn Ativan  7. Chest pain of uncertain etiology S/p negative stress test  Return in about 3 months (around 01/22/2016).  Satira Anis. [facility]  10/24/2015

## 2015-10-28 ENCOUNTER — Ambulatory Visit: Payer: BC Managed Care – PPO | Admitting: Certified Registered Nurse Anesthetist

## 2015-10-28 ENCOUNTER — Telehealth: Payer: Self-pay

## 2015-10-28 ENCOUNTER — Encounter: Payer: Self-pay | Admitting: *Deleted

## 2015-10-28 ENCOUNTER — Ambulatory Visit
Admission: RE | Admit: 2015-10-28 | Discharge: 2015-10-28 | Disposition: A | Discharge status: Home / Self Care | Payer: BC Managed Care – PPO | Source: Ambulatory Visit | Attending: Surgery | Admitting: Surgery

## 2015-10-28 ENCOUNTER — Encounter
Admission: RE | Disposition: A | Discharge status: Home / Self Care | Payer: Self-pay | Source: Ambulatory Visit | Attending: Surgery

## 2015-10-28 DIAGNOSIS — Z8349 Family history of other endocrine, nutritional and metabolic diseases: Secondary | ICD-10-CM | POA: Insufficient documentation

## 2015-10-28 DIAGNOSIS — Z8489 Family history of other specified conditions: Secondary | ICD-10-CM | POA: Insufficient documentation

## 2015-10-28 DIAGNOSIS — Z79899 Other long term (current) drug therapy: Secondary | ICD-10-CM | POA: Diagnosis not present

## 2015-10-28 DIAGNOSIS — F17210 Nicotine dependence, cigarettes, uncomplicated: Secondary | ICD-10-CM | POA: Insufficient documentation

## 2015-10-28 DIAGNOSIS — K219 Gastro-esophageal reflux disease without esophagitis: Secondary | ICD-10-CM | POA: Insufficient documentation

## 2015-10-28 DIAGNOSIS — Z8249 Family history of ischemic heart disease and other diseases of the circulatory system: Secondary | ICD-10-CM | POA: Insufficient documentation

## 2015-10-28 DIAGNOSIS — K4030 Unilateral inguinal hernia, with obstruction, without gangrene, not specified as recurrent: Secondary | ICD-10-CM | POA: Diagnosis not present

## 2015-10-28 DIAGNOSIS — R079 Chest pain, unspecified: Secondary | ICD-10-CM | POA: Insufficient documentation

## 2015-10-28 DIAGNOSIS — Z833 Family history of diabetes mellitus: Secondary | ICD-10-CM | POA: Insufficient documentation

## 2015-10-28 DIAGNOSIS — K4090 Unilateral inguinal hernia, without obstruction or gangrene, not specified as recurrent: Secondary | ICD-10-CM | POA: Diagnosis present

## 2015-10-28 DIAGNOSIS — E785 Hyperlipidemia, unspecified: Secondary | ICD-10-CM | POA: Insufficient documentation

## 2015-10-28 HISTORY — PX: INGUINAL HERNIA REPAIR: SHX194

## 2015-10-28 SURGERY — REPAIR, HERNIA, INGUINAL, ADULT
Anesthesia: General | Laterality: Left | Wound class: Clean

## 2015-10-28 MED ORDER — GLYCOPYRROLATE 0.2 MG/ML IJ SOLN
INTRAMUSCULAR | Status: DC | PRN
  Administered 2015-10-28: 0.2 mg via INTRAVENOUS

## 2015-10-28 MED ORDER — FENTANYL CITRATE (PF) 100 MCG/2ML IJ SOLN: 25.0000 ug | INTRAMUSCULAR | Status: DC | PRN

## 2015-10-28 MED ORDER — CHLORHEXIDINE GLUCONATE 4 % EX LIQD: 1.0000 "application " | Freq: Once | CUTANEOUS | Status: DC

## 2015-10-28 MED ORDER — ACETAMINOPHEN 10 MG/ML IV SOLN
INTRAVENOUS | Status: AC
  Filled 2015-10-28: qty 100

## 2015-10-28 MED ORDER — ONDANSETRON HCL 4 MG/2ML IJ SOLN
INTRAMUSCULAR | Status: DC | PRN
  Administered 2015-10-28: 4 mg via INTRAVENOUS

## 2015-10-28 MED ORDER — OXYCODONE-ACETAMINOPHEN 5-325 MG PO TABS: 1.0000 | ORAL_TABLET | ORAL | Status: DC | PRN

## 2015-10-28 MED ORDER — LACTATED RINGERS IV SOLN
INTRAVENOUS | Status: DC
  Administered 2015-10-28 (×2): via INTRAVENOUS

## 2015-10-28 MED ORDER — ONDANSETRON HCL 4 MG/2ML IJ SOLN: 4.0000 mg | Freq: Once | INTRAMUSCULAR | Status: DC | PRN

## 2015-10-28 MED ORDER — MIDAZOLAM HCL 2 MG/2ML IJ SOLN
INTRAMUSCULAR | Status: DC | PRN
  Administered 2015-10-28: 2 mg via INTRAVENOUS

## 2015-10-28 MED ORDER — DEXTROSE 5 % IV SOLN
2.0000 g | INTRAVENOUS | Status: AC
  Administered 2015-10-28: 2 g via INTRAVENOUS
  Filled 2015-10-28: qty 20

## 2015-10-28 MED ORDER — SUCCINYLCHOLINE CHLORIDE 20 MG/ML IJ SOLN
INTRAMUSCULAR | Status: DC | PRN
  Administered 2015-10-28: 140 mg via INTRAVENOUS

## 2015-10-28 MED ORDER — BUPIVACAINE HCL (PF) 0.25 % IJ SOLN
INTRAMUSCULAR | Status: DC | PRN
  Administered 2015-10-28: 30 mL

## 2015-10-28 MED ORDER — BUPIVACAINE HCL (PF) 0.25 % IJ SOLN
INTRAMUSCULAR | Status: AC
  Filled 2015-10-28: qty 30

## 2015-10-28 MED ORDER — ROCURONIUM BROMIDE 100 MG/10ML IV SOLN
INTRAVENOUS | Status: DC | PRN
  Administered 2015-10-28: 5 mg via INTRAVENOUS
  Administered 2015-10-28: 15 mg via INTRAVENOUS
  Administered 2015-10-28: 10 mg via INTRAVENOUS

## 2015-10-28 MED ORDER — CEFAZOLIN SODIUM-DEXTROSE 2-4 GM/100ML-% IV SOLN
INTRAVENOUS | Status: AC
  Filled 2015-10-28: qty 100

## 2015-10-28 MED ORDER — LIDOCAINE HCL (CARDIAC) 20 MG/ML IV SOLN
INTRAVENOUS | Status: DC | PRN
  Administered 2015-10-28: 80 mg via INTRAVENOUS

## 2015-10-28 MED ORDER — KETOROLAC TROMETHAMINE 30 MG/ML IJ SOLN
INTRAMUSCULAR | Status: DC | PRN
  Administered 2015-10-28: 30 mg via INTRAVENOUS

## 2015-10-28 MED ORDER — SUGAMMADEX SODIUM 200 MG/2ML IV SOLN
INTRAVENOUS | Status: DC | PRN
  Administered 2015-10-28: 200 mg via INTRAVENOUS

## 2015-10-28 MED ORDER — PROPOFOL 10 MG/ML IV BOLUS
INTRAVENOUS | Status: DC | PRN
  Administered 2015-10-28: 200 mg via INTRAVENOUS

## 2015-10-28 MED ORDER — FENTANYL CITRATE (PF) 100 MCG/2ML IJ SOLN
INTRAMUSCULAR | Status: DC | PRN
  Administered 2015-10-28 (×3): 50 ug via INTRAVENOUS

## 2015-10-28 MED ORDER — PHENYLEPHRINE HCL 10 MG/ML IJ SOLN
INTRAMUSCULAR | Status: DC | PRN
  Administered 2015-10-28 (×3): 100 ug via INTRAVENOUS

## 2015-10-28 MED ORDER — CYCLOBENZAPRINE HCL 10 MG PO TABS: 10.0000 mg | ORAL_TABLET | Freq: Three times a day (TID) | ORAL | Status: DC | PRN

## 2015-10-28 MED ORDER — DEXAMETHASONE SODIUM PHOSPHATE 10 MG/ML IJ SOLN
INTRAMUSCULAR | Status: DC | PRN
  Administered 2015-10-28: 10 mg via INTRAVENOUS

## 2015-10-28 SURGICAL SUPPLY — 34 items
BLADE SURG 15 STRL LF DISP TIS (BLADE) ×1 IMPLANT
BLADE SURG 15 STRL SS (BLADE) ×2
CANISTER SUCT 1200ML W/VALVE (MISCELLANEOUS) ×3 IMPLANT
CHLORAPREP W/TINT 26ML (MISCELLANEOUS) ×3 IMPLANT
DRAIN PENROSE 1/4X12 LTX (DRAIN) ×3 IMPLANT
DRAPE LAPAROTOMY TRNSV 106X77 (MISCELLANEOUS) ×3 IMPLANT
ELECT CAUTERY BLADE 6.4 (BLADE) ×3 IMPLANT
ELECT REM PT RETURN 9FT ADLT (ELECTROSURGICAL) ×3
ELECTRODE REM PT RTRN 9FT ADLT (ELECTROSURGICAL) ×1 IMPLANT
GLOVE BIO SURGEON STRL SZ 6.5 (GLOVE) ×2 IMPLANT
GLOVE BIO SURGEON STRL SZ7 (GLOVE) ×3 IMPLANT
GLOVE BIO SURGEONS STRL SZ 6.5 (GLOVE) ×1
GLOVE EXAM NITRILE PF MED BLUE (GLOVE) ×3 IMPLANT
GLOVE PI ORTHOPRO 6.5 (GLOVE) ×2
GLOVE PI ORTHOPRO STRL 6.5 (GLOVE) ×1 IMPLANT
GOWN STRL REUS W/ TWL LRG LVL3 (GOWN DISPOSABLE) ×2 IMPLANT
GOWN STRL REUS W/TWL LRG LVL3 (GOWN DISPOSABLE) ×4
LABEL OR SOLS (LABEL) ×3 IMPLANT
LIQUID BAND (GAUZE/BANDAGES/DRESSINGS) ×3 IMPLANT
MESH PARIETEX PROGRIP LEFT (Mesh General) ×3 IMPLANT
NEEDLE HYPO 25X1 1.5 SAFETY (NEEDLE) ×3 IMPLANT
NS IRRIG 500ML POUR BTL (IV SOLUTION) ×3 IMPLANT
PACK BASIN MINOR ARMC (MISCELLANEOUS) ×3 IMPLANT
SUT MNCRL 3-0 UNDYED SH (SUTURE) IMPLANT
SUT MNCRL 4-0 (SUTURE) ×2
SUT MNCRL 4-0 27XMFL (SUTURE) ×1
SUT MONOCRYL 3-0 UNDYED (SUTURE)
SUT PROLENE 2 0 SH DA (SUTURE) ×3 IMPLANT
SUT SILK 2 0 SH (SUTURE) IMPLANT
SUT VIC AB 2-0 BRD 54 (SUTURE) ×3 IMPLANT
SUT VIC AB 3-0 SH 27 (SUTURE) ×2
SUT VIC AB 3-0 SH 27X BRD (SUTURE) ×1 IMPLANT
SUTURE MNCRL 4-0 27XMF (SUTURE) ×1 IMPLANT
SYRINGE 10CC LL (SYRINGE) ×3 IMPLANT

## 2015-10-28 NOTE — H&P (View-Only) (Signed)
Patient ID: Harold Carroll, male   DOB: [birth year], 50 y.o.   MRN: [id number]  CC: Left groin bulge  HPI Harold Carroll is a 50 y.o. male who presents to clinic for evaluation of a left inguinal hernia. Patient states that approximately 9 months ago he started noticeable bulge the area that was easily reducible and does not bother him very much. Over the last month the area started to become larger and more symptomatic. It is a specialist back when he coughs or attempts to lift anything heavy. It is remained soft and he said normal bowel function without any nausea or vomiting. He denies any fevers, chills, chest pain, shortness of breath, chronic abdominal pains. He is here to discuss operative repair.  HPI  Past Medical History  Diagnosis Date  . GERD (gastroesophageal reflux disease)   . Panic attacks     Past Surgical History  Procedure Laterality Date  . Tumor removal    . Tonsillectomy and adenoidectomy    . Skin surgery      Tumor-benign  . Ventral hernia repair  1968    Dr. Mare Ferrari    Family History  Problem Relation Age of Onset  . Thyroid disease Mother   . Heart disease Father     heart attack  . Diabetes Father   . Lung cancer Maternal Aunt   . Cancer Maternal Aunt     lung cancer  . Heart disease Paternal Uncle   . Cancer Maternal Grandfather     lung cancer    Social History Social History  Substance Use Topics  . Smoking status: Current Every Day Smoker -- 1.00 packs/day    Types: Cigarettes  . Smokeless tobacco: None  . Alcohol Use: 4.8 oz/week    8 Standard drinks or equivalent per week    No Known Allergies  Current Outpatient Prescriptions  Medication Sig Dispense Refill  . loratadine (CLARITIN) 10 MG tablet Take 1 tablet (10 mg total) by mouth daily as needed for allergies. Take 1 tablet in the morning. As needed for itching. 30 tablet 0  . LORazepam (ATIVAN) 1 MG tablet Take 1 tablet (1 mg total) by mouth every 8 (eight)  hours as needed for anxiety. 4 tablet 0  . Multiple Vitamin (MULTIVITAMIN) capsule Take 1 capsule by mouth daily.    . ranitidine (ZANTAC) 75 MG tablet Take 75 mg by mouth 2 (two) times daily.     No current facility-administered medications for this visit.     Review of Systems A multi-point review of systems was asked and was negative except for the findings documented in the history of present illness  Physical Exam Blood pressure 133/93, pulse 78, temperature 98.3 F (36.8 C), temperature source Oral, height 6' (1.829 m), weight 94.802 kg (209 lb). CONSTITUTIONAL: No acute distress. EYES: Pupils are equal, round, and reactive to light, Sclera are non-icteric. EARS, NOSE, MOUTH AND THROAT: The oropharynx is clear. The oral mucosa is pink and moist. Hearing is intact to voice. LYMPH NODES:  Lymph nodes in the neck are normal. RESPIRATORY:  Lungs are clear. There is normal respiratory effort, with equal breath sounds bilaterally, and without pathologic use of accessory muscles. CARDIOVASCULAR: Heart is regular without murmurs, gallops, or rubs. GI: The abdomen is soft, nontender, and nondistended. There are is an obvious left inguinal hernia that is easily reducible. No evidence of right inguinal hernia on exam.. There is no hepatosplenomegaly. There are normal bowel sounds in all quadrants. GU: Rectal  deferred.   MUSCULOSKELETAL: Normal muscle strength and tone. No cyanosis or edema.   SKIN: Turgor is good and there are no pathologic skin lesions or ulcers. NEUROLOGIC: Motor and sensation is grossly normal. Cranial nerves are grossly intact. PSYCH:  Oriented to person, place and time. Affect is normal.  Data Reviewed Patient had recent labs that are all within normal limits I have personally reviewed the patient's imaging, laboratory findings and medical records.    Assessment    50 year old male with a symptomatic left inguinal hernia    Plan    Left inguinal hernia:  Discussed with the patient treatment options of open versus laparoscopic anal hernia repair the risks, benefits, alternatives of each were discussed in detail. After this long discussion the patient discussed that he thinks he prefer an open inguinal hernia repair as soon as possible. He then recanted instead actually I need to work for the next 2 weeks but then this was possible after the next 2 weeks. After referring to the schedule noted that the next operative possibility would be March 28 with my partner Dr. Azalee Course. Patient stated that would be acceptable and we will schedule him for surgery on March 28. Discussed with him that should he have any previous occurrences of chest pain or any new medical findings this no immediately. Otherwise he'll follow up with his primary care and is scheduled now for surgery on the 20th.     Time spent with the patient was 60 minutes, with more than 50% of the time spent in face-to-face education, counseling and care coordination.     Clayburn Pert, MD FACS General Surgeon 10/02/2015, 9:45 AM

## 2015-10-28 NOTE — Anesthesia Preprocedure Evaluation (Signed)
Anesthesia Evaluation  Patient identified by MRN, date of birth, ID band Patient awake    Reviewed: Allergy & Precautions, NPO status , Patient's Chart, lab work & pertinent test results  History of Anesthesia Complications Negative for: history of anesthetic complications  Airway Mallampati: II       Dental   Pulmonary neg pulmonary ROS, former smoker,           Cardiovascular (-) hypertension     Neuro/Psych Anxiety negative neurological ROS     GI/Hepatic Neg liver ROS, GERD  Medicated and Controlled,  Endo/Other  negative endocrine ROS  Renal/GU negative Renal ROS     Musculoskeletal   Abdominal   Peds  Hematology   Anesthesia Other Findings   Reproductive/Obstetrics                             Anesthesia Physical Anesthesia Plan  ASA: II  Anesthesia Plan: General   Post-op Pain Management:    Induction: Intravenous  Airway Management Planned: Oral ETT  Additional Equipment:   Intra-op Plan:   Post-operative Plan:   Informed Consent: I have reviewed the patients History and Physical, chart, labs and discussed the procedure including the risks, benefits and alternatives for the proposed anesthesia with the patient or authorized representative who has indicated his/her understanding and acceptance.     Plan Discussed with:   Anesthesia Plan Comments:         Anesthesia Quick Evaluation

## 2015-10-28 NOTE — Anesthesia Postprocedure Evaluation (Signed)
Anesthesia Post Note  Patient: Harold Carroll  Procedure(s) Performed: Procedure(s) (LRB): HERNIA REPAIR INGUINAL ADULT (Left)  Patient location during evaluation: PACU Anesthesia Type: General Level of consciousness: awake and alert Pain management: pain level controlled Vital Signs Assessment: post-procedure vital signs reviewed and stable Respiratory status: spontaneous breathing and respiratory function stable Cardiovascular status: stable Anesthetic complications: no    Last Vitals:  Filed Vitals:   10/28/15 0953 10/28/15 1000  BP: 146/96 134/81  Pulse: 66 63  Temp: 35.9 C   Resp: 16     Last Pain: There were no vitals filed for this visit.               Allisha Harter K

## 2015-10-28 NOTE — Op Note (Signed)
Hernia, Open, Procedure Note  Indications: The patient presented with a history of a left, not reducible hernia.    Pre-operative Diagnosis: left not reducible  Post-operative Diagnosis: same  Surgeon: Hubbard Robinson   Assistants: none  Anesthesia: General endotracheal anesthesia  ASA Class: 2  Procedure Details  The patient was seen again in the Holding Room. The risks, benefits, complications, treatment options, and expected outcomes were discussed with the patient. The possibilities of reaction to medication, pulmonary aspiration, perforation of viscus, bleeding, recurrent infection, the need for additional procedures, and development of a complication requiring transfusion or further operation were discussed with the patient and/or family. There was concurrence with the proposed plan, and informed consent was obtained. The site of surgery was properly noted/marked. The patient was taken to the Operating Room, identified as Harold Carroll, and the procedure verified as hernia repair. A Time Out was held and the above information confirmed.  The patient was placed in the supine position and underwent induction of anesthesia, the lower abdomen and groin was prepped and draped in the standard fashion, and 0.5% Marcaine was used to anesthetize the skin over the mid-portion of the inguinal canal. A transverse incision was made. Dissection was carried through the soft tissue to expose the inguinal canal and inguinal ligament along its lower edge. The external oblique fascia was split along the course of its fibers, exposing the inguinal canal. The cord and nerve were looped using a Penrose drain and reflected out of the field. The defect was exposed and a piece of self fixating mesh was placed over the defect. Two 2-0 Prolene sutures were then used to suture the mesh to the pubic tubercle inferiorly and superiorly. The contents were then returned to canal. The external oblique fascia was  then closed in a continuous fashion using 3-0 Vicryl suture taking care not to cause entrapment.    The wound bed was irrigated out with saline and remaining marcaine was used to anesthetize the subcutaneous tissues.  The subcutaneous tissues were closed in layers using a running 3-0 Vicryl suture and deep dermal layer as well.  The subcuticular tissues were closed using a 4-0 Monocryl in a continuous running fashion.  The skin was closed using sterile glue.    Instrument, sponge, and needle counts were correct prior to closure and at the conclusion of the case.  Findings: Hernia as above  Estimated Blood Loss: Minimal         Drains: None         Total IV Fluids: 1075mL         Specimens: none               Complications: None; patient tolerated the procedure well.         Disposition: PACU - hemodynamically stable.         Condition: stable

## 2015-10-28 NOTE — Discharge Instructions (Signed)
AMBULATORY SURGERY  °DISCHARGE INSTRUCTIONS ° ° °1) The drugs that you were given will stay in your system until tomorrow so for the next 24 hours you should not: ° °A) Drive an automobile °B) Make any legal decisions °C) Drink any alcoholic beverage ° ° °2) You may resume regular meals tomorrow.  Today it is better to start with liquids and gradually work up to solid foods. ° °You may eat anything you prefer, but it is better to start with liquids, then soup and crackers, and gradually work up to solid foods. ° ° °3) Please notify your doctor immediately if you have any unusual bleeding, trouble breathing, redness and pain at the surgery site, drainage, fever, or pain not relieved by medication. ° ° ° °4) Additional Instructions: ° ° ° ° ° ° ° °Please contact your physician with any problems or Same Day Surgery at [phone], Monday through Friday 6 am to 4 pm, or [place] [facility] [place] Main number at [phone].AMBULATORY SURGERY  °DISCHARGE INSTRUCTIONS ° ° °5) The drugs that you were given will stay in your system until tomorrow so for the next 24 hours you should not: ° °D) Drive an automobile °E) Make any legal decisions °F) Drink any alcoholic beverage ° ° °6) You may resume regular meals tomorrow.  Today it is better to start with liquids and gradually work up to solid foods. ° °You may eat anything you prefer, but it is better to start with liquids, then soup and crackers, and gradually work up to solid foods. ° ° °7) Please notify your doctor immediately if you have any unusual bleeding, trouble breathing, redness and pain at the surgery site, drainage, fever, or pain not relieved by medication. ° ° ° °8) Additional Instructions: ° ° ° ° ° ° ° °Please contact your physician with any problems or Same Day Surgery at [phone], Monday through Friday 6 am to 4 pm, or [place] [facility] [place] Main number at [phone]. °

## 2015-10-28 NOTE — Telephone Encounter (Signed)
Called patient to let him know that his Disability Forms were filled out and left at the [facility] to be picked up as per patient's request. However, I told him that if he calls his manager for a fax number, to please call me so I could fax it for him instead of him coming in to pick it up. Patient understood.

## 2015-10-28 NOTE — Interval H&P Note (Signed)
History and Physical Interval Note:  10/28/2015 7:38 AM  Harold Carroll  has presented today for surgery, with the diagnosis of LEFT INGUINAL HERNIA  The various methods of treatment have been discussed with the patient and family. After consideration of risks, benefits and other options for treatment, the patient has consented to  Procedure(s): HERNIA REPAIR INGUINAL ADULT (Left) as a surgical intervention .  The patient's history has been reviewed, patient examined, no change in status, stable for surgery.  I have reviewed the patient's chart and labs.  Questions were answered to the patient's satisfaction.     Honey Zakarian L Aira Sallade

## 2015-10-28 NOTE — Progress Notes (Signed)
In SDS. Ambulated to BR. Voided without difficulty. Tolerated well.

## 2015-10-28 NOTE — Progress Notes (Signed)
No pain at discharge

## 2015-10-28 NOTE — Anesthesia Procedure Notes (Signed)
Procedure Name: Intubation Date/Time: 10/28/2015 7:47 AM Performed by: Johnna Acosta Pre-anesthesia Checklist: Patient identified, Emergency Drugs available, Suction available, Patient being monitored and Timeout performed Patient Re-evaluated:Patient Re-evaluated prior to inductionOxygen Delivery Method: Circle system utilized Preoxygenation: Pre-oxygenation with 100% oxygen Intubation Type: IV induction Ventilation: Mask ventilation without difficulty Laryngoscope Size: Miller and 2 Grade View: Grade I Tube type: Oral Tube size: 7.5 mm Number of attempts: 1 Airway Equipment and Method: Stylet Placement Confirmation: ETT inserted through vocal cords under direct vision,  positive ETCO2 and breath sounds checked- equal and bilateral Secured at: 22 cm Tube secured with: Tape Dental Injury: Teeth and Oropharynx as per pre-operative assessment

## 2015-10-28 NOTE — Transfer of Care (Signed)
Immediate Anesthesia Transfer of Care Note  Patient: Harold Carroll  Procedure(s) Performed: Procedure(s): HERNIA REPAIR INGUINAL ADULT (Left)  Patient Location: PACU  Anesthesia Type:General  Level of Consciousness: sedated  Airway & Oxygen Therapy: Patient Spontanous Breathing and Patient connected to face mask oxygen  Post-op Assessment: Report given to RN and Post -op Vital signs reviewed and stable  Post vital signs: Reviewed and stable  Last Vitals:  Filed Vitals:   10/28/15 0612 10/28/15 0626  BP:  123/84  Pulse: 97   Temp: 35.3 C   Resp: 16     Complications: No apparent anesthesia complications

## 2015-10-28 NOTE — Progress Notes (Signed)
Awake. Denies pain. Sprite given to drink. Tolerated well.

## 2015-10-30 ENCOUNTER — Encounter: Payer: Self-pay | Admitting: Surgery

## 2015-11-04 ENCOUNTER — Encounter: Payer: Self-pay | Admitting: Surgery

## 2015-11-04 ENCOUNTER — Ambulatory Visit (INDEPENDENT_AMBULATORY_CARE_PROVIDER_SITE_OTHER): Payer: BC Managed Care – PPO | Admitting: Surgery

## 2015-11-04 VITALS — BP 135/92 | HR 85 | Temp 98.3°F | Wt 208.0 lb

## 2015-11-04 DIAGNOSIS — K4090 Unilateral inguinal hernia, without obstruction or gangrene, not specified as recurrent: Secondary | ICD-10-CM

## 2015-11-04 NOTE — Progress Notes (Signed)
50 year old male status post open left inguinal hernia repair on 3/28.  Patient states that he has been doing well. Patient states that he's had a good appetite. Patient denies having to use any pain medicine at this time. Patient did state that he had some constipation after using the pain medicine but started taking a stool softener as well and has had normal bowel movements. Patient also states that he is urinating well without any difficulty.  Filed Vitals:   11/04/15 1421  BP: 135/92  Pulse: 85  Temp: 98.3 F (36.8 C)   PE:  Gen: NAD Left groin: incision site, c/d/i with sterile glue in place and some bruising surrounding incision and to scrotum  A/P:  Healing well from open left inguinal hernia repair. Patient can return to work with restrictions of not lifting anything over 15-20 pounds on Monday, April 10. He is to remain on these restrictions for 4 weeks. Work excuse given. Patient can follow-up when necessary

## 2016-04-15 ENCOUNTER — Ambulatory Visit (INDEPENDENT_AMBULATORY_CARE_PROVIDER_SITE_OTHER): Payer: BC Managed Care – PPO | Admitting: Internal Medicine

## 2016-04-15 ENCOUNTER — Encounter: Payer: Self-pay | Admitting: Internal Medicine

## 2016-04-15 VITALS — BP 121/80 | HR 74 | Resp 16 | Ht 72.0 in | Wt 231.0 lb

## 2016-04-15 DIAGNOSIS — B029 Zoster without complications: Secondary | ICD-10-CM | POA: Diagnosis not present

## 2016-04-15 MED ORDER — VALACYCLOVIR HCL 1 G PO TABS: 1000.0000 mg | ORAL_TABLET | Freq: Three times a day (TID) | ORAL | 0 refills | Status: DC

## 2016-04-15 NOTE — Progress Notes (Signed)
Date:  04/15/2016   Name:  Harold Carroll   DOB:  04/06/1966   MRN:  [id number]   Chief Complaint: Rash (1 week) Rash  This is a new problem. The current episode started in the past 7 days. The affected locations include the neck and chest. The rash is characterized by blistering, burning, itchiness and redness. He was exposed to nothing. Pertinent negatives include no cough, fatigue, fever, shortness of breath or sore throat. Past treatments include antihistamine and anti-itch cream. The treatment provided no relief. His past medical history is significant for varicella.      Review of Systems  Constitutional: Negative for fatigue and fever.  HENT: Negative for sore throat.   Eyes: Negative for visual disturbance.  Respiratory: Negative for cough, chest tightness and shortness of breath.   Cardiovascular: Negative for chest pain.  Musculoskeletal: Negative for arthralgias and back pain.  Skin: Positive for rash.    Patient Active Problem List   Diagnosis Date Noted  . Benign essential HTN 10/02/2015  . Chest pain 10/02/2015  . Combined fat and carbohydrate induced hyperlipemia 10/02/2015  . Overweight (BMI 25.0-29.9) 09/24/2015  . Smoker 09/24/2015  . Chest pain of uncertain etiology [id number]  . Panic disorder 09/24/2015  . FH: heart disease 09/24/2015  . FH: diabetes mellitus 09/24/2015  . GERD (gastroesophageal reflux disease) 09/24/2015  . Seasonal allergic rhinitis 09/24/2015    Prior to Admission medications   Medication Sig Start Date End Date Taking? Authorizing Provider  loratadine (CLARITIN) 10 MG tablet Take 1 tablet (10 mg total) by mouth daily as needed for allergies. Take 1 tablet in the morning. As needed for itching. 09/24/15  Yes Adline Potter, MD  Multiple Vitamin (MULTIVITAMIN) capsule Take 1 capsule by mouth daily.   Yes Historical Provider, MD  ranitidine (ZANTAC) 75 MG tablet Take 150 mg by mouth 2 (two) times daily as needed.   Yes Historical  Provider, MD    No Known Allergies  Past Surgical History:  Procedure Laterality Date  . INGUINAL HERNIA REPAIR Left 10/28/2015   Procedure: HERNIA REPAIR INGUINAL ADULT;  Surgeon: Hubbard Robinson, MD;  Location: [facility] ORS;  Service: General;  Laterality: Left;  . SKIN SURGERY     Tumor-benign  . TONSILLECTOMY AND ADENOIDECTOMY    . TUMOR REMOVAL    . VENTRAL HERNIA REPAIR  1968   Dr. Mare Ferrari    Social History  Substance Use Topics  . Smoking status: Former Smoker    Packs/day: 1.00    Types: Cigarettes    Start date: 10/22/1983    Quit date: 10/08/2015  . Smokeless tobacco: Never Used  . Alcohol use 4.8 oz/week    8 Standard drinks or equivalent per week     Medication list has been reviewed and updated.   Physical Exam  Constitutional: He is oriented to person, place, and time. He appears well-developed. No distress.  HENT:  Head: Normocephalic and atraumatic.  Cardiovascular: Normal rate, regular rhythm and normal heart sounds.   Pulmonary/Chest: Effort normal and breath sounds normal. No respiratory distress.  Musculoskeletal: Normal range of motion.  Neurological: He is alert and oriented to person, place, and time.  Skin: Skin is warm and dry. No rash noted.     Psychiatric: He has a normal mood and affect. His behavior is normal. Thought content normal.  Nursing note and vitals reviewed.   BP 121/80   Pulse 74   Resp 16   Ht 6' (1.829 m)  Wt 231 lb (104.8 kg)   SpO2 98%   BMI 31.33 kg/m   Assessment and Plan: 1. [place] Local care instructions given Consider Zostavax if covered in 6 months - valACYclovir (VALTREX) 1000 MG tablet; Take 1 tablet (1,000 mg total) by mouth 3 (three) times daily.  Dispense: 30 tablet; Refill: 0   Halina Maidens, MD [facility]  04/15/2016

## 2016-04-15 NOTE — Patient Instructions (Signed)

## 2016-04-23 ENCOUNTER — Ambulatory Visit (INDEPENDENT_AMBULATORY_CARE_PROVIDER_SITE_OTHER): Payer: BC Managed Care – PPO | Admitting: Internal Medicine

## 2016-04-23 ENCOUNTER — Telehealth: Payer: Self-pay | Admitting: Internal Medicine

## 2016-04-23 VITALS — BP 124/82 | HR 84 | Temp 97.5°F | Resp 16 | Ht 72.0 in | Wt 230.0 lb

## 2016-04-23 DIAGNOSIS — L739 Follicular disorder, unspecified: Secondary | ICD-10-CM | POA: Diagnosis not present

## 2016-04-23 MED ORDER — SULFAMETHOXAZOLE-TRIMETHOPRIM 800-160 MG PO TABS: 1.0000 | ORAL_TABLET | Freq: Two times a day (BID) | ORAL | 0 refills | Status: DC

## 2016-04-23 NOTE — Progress Notes (Signed)
Date:  04/23/2016   Name:  Harold Carroll   DOB:  09/23/1965   MRN:  [id number]   Chief Complaint: Herpes Zoster (started on neck as a few bumps like razor rash and covered in it now. no fever but severe burning. ) Seen 9 days ago with what appeared to be zoster on his neck and face.  He has taken valtrex and now has lesions spread over both sides of his face, chest and groin.  They are painful and itchy.  There is some slight crusting.   Review of Systems  Constitutional: Negative for chills, fatigue and fever.  Respiratory: Negative for chest tightness and shortness of breath.   Cardiovascular: Negative for chest pain.  Skin: Positive for color change and rash.    Patient Active Problem List   Diagnosis Date Noted  . Benign essential HTN 10/02/2015  . Chest pain 10/02/2015  . Combined fat and carbohydrate induced hyperlipemia 10/02/2015  . Overweight (BMI 25.0-29.9) 09/24/2015  . Smoker 09/24/2015  . Chest pain of uncertain etiology [id number]  . Panic disorder 09/24/2015  . FH: heart disease 09/24/2015  . FH: diabetes mellitus 09/24/2015  . GERD (gastroesophageal reflux disease) 09/24/2015  . Seasonal allergic rhinitis 09/24/2015    Prior to Admission medications   Medication Sig Start Date End Date Taking? Authorizing Provider  loratadine (CLARITIN) 10 MG tablet Take 1 tablet (10 mg total) by mouth daily as needed for allergies. Take 1 tablet in the morning. As needed for itching. 09/24/15  Yes Adline Potter, MD  Multiple Vitamin (MULTIVITAMIN) capsule Take 1 capsule by mouth daily.   Yes Historical Provider, MD  ranitidine (ZANTAC) 75 MG tablet Take 150 mg by mouth 2 (two) times daily as needed.   Yes Historical Provider, MD  valACYclovir (VALTREX) 1000 MG tablet Take 1 tablet (1,000 mg total) by mouth 3 (three) times daily. 04/15/16  Yes Glean Hess, MD    No Known Allergies  Past Surgical History:  Procedure Laterality Date  . INGUINAL HERNIA REPAIR Left  10/28/2015   Procedure: HERNIA REPAIR INGUINAL ADULT;  Surgeon: Hubbard Robinson, MD;  Location: [facility] ORS;  Service: General;  Laterality: Left;  . SKIN SURGERY     Tumor-benign  . TONSILLECTOMY AND ADENOIDECTOMY    . TUMOR REMOVAL    . VENTRAL HERNIA REPAIR  1968   Dr. Mare Ferrari    Social History  Substance Use Topics  . Smoking status: Former Smoker    Packs/day: 1.00    Types: Cigarettes    Start date: 10/22/1983    Quit date: 10/08/2015  . Smokeless tobacco: Never Used  . Alcohol use 4.8 oz/week    8 Standard drinks or equivalent per week     Medication list has been reviewed and updated.   Physical Exam  Constitutional: He is oriented to person, place, and time. He appears well-developed. No distress.  HENT:  Head: Normocephalic and atraumatic.  Pulmonary/Chest: Effort normal. No respiratory distress.  Musculoskeletal: Normal range of motion.  Neurological: He is alert and oriented to person, place, and time.  Skin: Skin is warm and dry. No rash noted.  Scattered areas of excoriation and blistering over neck, cheeks, chest, arms and abdomen.   Psychiatric: He has a normal mood and affect. His behavior is normal. Thought content normal.  Nursing note and vitals reviewed.   BP 124/82   Pulse 84   Temp 97.5 F (36.4 C) (Oral)   Resp 16   Ht  6' (1.829 m)   Wt 230 lb (104.3 kg)   BMI 31.19 kg/m   Assessment and Plan: 1. Folliculitis Will cover for MRSA Use pre-surgical body wash once a day Call in three days if not improving - sulfamethoxazole-trimethoprim (BACTRIM DS,SEPTRA DS) 800-160 MG tablet; Take 1 tablet by mouth 2 (two) times daily.  Dispense: 28 tablet; Refill: 0   Halina Maidens, MD [facility]  04/23/2016

## 2016-04-23 NOTE — Telephone Encounter (Signed)
I think I will need to see him today to re-evaluate.

## 2016-04-23 NOTE — Patient Instructions (Signed)
Folliculitis °Folliculitis is redness, soreness, and swelling (inflammation) of the hair follicles. This condition can occur anywhere on the body. People with weakened immune systems, diabetes, or obesity have a greater risk of getting folliculitis. °CAUSES °· Bacterial infection. This is the most common cause. °· Fungal infection. °· Viral infection. °· Contact with certain chemicals, especially oils and tars. °Long-term folliculitis can result from bacteria that live in the nostrils. The bacteria may trigger multiple outbreaks of folliculitis over time. °SYMPTOMS °Folliculitis most commonly occurs on the scalp, thighs, legs, back, buttocks, and areas where hair is shaved frequently. An early sign of folliculitis is a small, white or yellow, pus-filled, itchy lesion (pustule). These lesions appear on a red, inflamed follicle. They are usually less than 0.2 inches (5 mm) wide. When there is an infection of the follicle that goes deeper, it becomes a boil or furuncle. A group of closely packed boils creates a larger lesion (carbuncle). Carbuncles tend to occur in hairy, sweaty areas of the body. °DIAGNOSIS  °Your caregiver can usually tell what is wrong by doing a physical exam. A sample may be taken from one of the lesions and tested in a lab. This can help determine what is causing your folliculitis. °TREATMENT  °Treatment may include: °· Applying warm compresses to the affected areas. °· Taking antibiotic medicines orally or applying them to the skin. °· Draining the lesions if they contain a large amount of pus or fluid. °· Laser hair removal for cases of long-lasting folliculitis. This helps to prevent regrowth of the hair. °HOME CARE INSTRUCTIONS °· Apply warm compresses to the affected areas as directed by your caregiver. °· If antibiotics are prescribed, take them as directed. Finish them even if you start to feel better. °· You may take over-the-counter medicines to relieve itching. °· Do not shave irritated  skin. °· Follow up with your caregiver as directed. °SEEK IMMEDIATE MEDICAL CARE IF:  °· You have increasing redness, swelling, or pain in the affected area. °· You have a fever. °MAKE SURE YOU: °· Understand these instructions. °· Will watch your condition. °· Will get help right away if you are not doing well or get worse. °  °This information is not intended to replace advice given to you by your health care provider. Make sure you discuss any questions you have with your health care provider. °  °Document Released: 09/27/2001 Document Revised: 08/09/2014 Document Reviewed: 10/19/2011 °Elsevier Interactive Patient Education ©2016 [employer] ° °

## 2016-04-23 NOTE — Telephone Encounter (Signed)
Pt called said he was here last week on 04/15/16 for a rash on his neck said it was shingles, pt called and said he now has it all over his face and on the back of his head, asked if we could call some more medication in or If he needed to come in for another appt.

## 2016-04-26 ENCOUNTER — Telehealth: Payer: Self-pay

## 2016-04-26 NOTE — Telephone Encounter (Signed)
Patient wants to use lotion on rash. Advised can use neosporin. No VM x 2

## 2016-05-04 ENCOUNTER — Telehealth: Payer: Self-pay

## 2016-05-04 NOTE — Telephone Encounter (Signed)
Faxed FMLA 

## 2016-05-24 ENCOUNTER — Other Ambulatory Visit: Payer: Self-pay | Admitting: Internal Medicine

## 2016-05-24 DIAGNOSIS — L739 Follicular disorder, unspecified: Secondary | ICD-10-CM

## 2016-05-24 MED ORDER — SULFAMETHOXAZOLE-TRIMETHOPRIM 800-160 MG PO TABS: 1.0000 | ORAL_TABLET | Freq: Two times a day (BID) | ORAL | 0 refills | Status: DC

## 2016-07-01 ENCOUNTER — Encounter: Payer: Self-pay | Admitting: Internal Medicine

## 2016-07-01 ENCOUNTER — Ambulatory Visit (INDEPENDENT_AMBULATORY_CARE_PROVIDER_SITE_OTHER): Payer: BC Managed Care – PPO | Admitting: Internal Medicine

## 2016-07-01 VITALS — BP 142/78 | HR 78 | Temp 97.7°F | Ht 72.0 in | Wt 230.0 lb

## 2016-07-01 DIAGNOSIS — J0100 Acute maxillary sinusitis, unspecified: Secondary | ICD-10-CM

## 2016-07-01 DIAGNOSIS — L739 Follicular disorder, unspecified: Secondary | ICD-10-CM | POA: Diagnosis not present

## 2016-07-01 MED ORDER — AMOXICILLIN-POT CLAVULANATE 875-125 MG PO TABS: 1.0000 | ORAL_TABLET | Freq: Two times a day (BID) | ORAL | 0 refills | Status: DC

## 2016-07-01 MED ORDER — FLUTICASONE PROPIONATE 50 MCG/ACT NA SUSP: 2.0000 | Freq: Every day | NASAL | 6 refills | Status: DC

## 2016-07-01 NOTE — Progress Notes (Signed)
Date:  07/01/2016   Name:  Harold Carroll   DOB:  11/19/1965   MRN:  [id number]   Chief Complaint: Rash (Was diagnosed with folliculitis, but after taking 2 rounds of antibiotics it cleared it up but comes back every 2-3 weeks. ) and Sinusitis (green production, and face pressure)  Sinusitis  This is a new problem. The current episode started in the past 7 days. The problem has been gradually worsening since onset. There has been no fever. Associated symptoms include coughing, sinus pressure and swollen glands. Pertinent negatives include no chills, ear pain or headaches. Past treatments include oral decongestants.   Folliculitis - he was seen for a severe rash that appeared to be zoster with bacterial superinfection.  He responded well to Bactrim.  After several weeks the rash recurred and he was given another round of bactrim.  He has done well since then but now has recurrence of rash on the posterior right neck and scalp.  He frustrated at the recurrence.  Review of Systems  Constitutional: Negative for chills.  HENT: Positive for sinus pressure and tinnitus. Negative for ear pain.   Eyes: Negative for visual disturbance.  Respiratory: Positive for cough. Negative for chest tightness and wheezing.   Cardiovascular: Negative for chest pain, palpitations and leg swelling.  Skin: Positive for rash.  Neurological: Negative for dizziness and headaches.    Patient Active Problem List   Diagnosis Date Noted  . Benign essential HTN 10/02/2015  . Chest pain 10/02/2015  . Combined fat and carbohydrate induced hyperlipemia 10/02/2015  . Overweight (BMI 25.0-29.9) 09/24/2015  . Smoker 09/24/2015  . Chest pain of uncertain etiology [id number]  . Panic disorder 09/24/2015  . FH: heart disease 09/24/2015  . FH: diabetes mellitus 09/24/2015  . GERD (gastroesophageal reflux disease) 09/24/2015  . Seasonal allergic rhinitis 09/24/2015    Prior to Admission medications   Medication  Sig Start Date End Date Taking? Authorizing Provider  loratadine (CLARITIN) 10 MG tablet Take 1 tablet (10 mg total) by mouth daily as needed for allergies. Take 1 tablet in the morning. As needed for itching. 09/24/15  Yes Adline Potter, MD  Multiple Vitamin (MULTIVITAMIN) capsule Take 1 capsule by mouth daily.   Yes Historical Provider, MD  ranitidine (ZANTAC) 75 MG tablet Take 150 mg by mouth 2 (two) times daily as needed.   Yes Historical Provider, MD    No Known Allergies  Past Surgical History:  Procedure Laterality Date  . INGUINAL HERNIA REPAIR Left 10/28/2015   Procedure: HERNIA REPAIR INGUINAL ADULT;  Surgeon: Hubbard Robinson, MD;  Location: [facility] ORS;  Service: General;  Laterality: Left;  . SKIN SURGERY     Tumor-benign  . TONSILLECTOMY AND ADENOIDECTOMY    . TUMOR REMOVAL    . VENTRAL HERNIA REPAIR  1968   Dr. Mare Ferrari    Social History  Substance Use Topics  . Smoking status: Former Smoker    Packs/day: 1.00    Types: Cigarettes    Start date: 10/22/1983    Quit date: 10/08/2015  . Smokeless tobacco: Never Used  . Alcohol use 4.8 oz/week    8 Standard drinks or equivalent per week     Medication list has been reviewed and updated.   Physical Exam  Constitutional: He is oriented to person, place, and time. He appears well-developed and well-nourished. No distress.  HENT:  Head: Normocephalic and atraumatic.  Right Ear: External ear and ear canal normal. Tympanic membrane is not erythematous  and not retracted.  Left Ear: External ear and ear canal normal. Tympanic membrane is not erythematous and not retracted.  Nose: Right sinus exhibits maxillary sinus tenderness and frontal sinus tenderness. Left sinus exhibits maxillary sinus tenderness and frontal sinus tenderness.  Mouth/Throat: Uvula is midline and mucous membranes are normal. No oral lesions. Posterior oropharyngeal erythema present. No oropharyngeal exudate.  Cardiovascular: Normal rate, regular rhythm  and normal heart sounds.   Pulmonary/Chest: Effort normal and breath sounds normal. No respiratory distress. He has no wheezes. He has no rales.  Musculoskeletal: Normal range of motion.  Lymphadenopathy:    He has no cervical adenopathy.  Neurological: He is alert and oriented to person, place, and time.  Skin: Skin is warm and dry. Rash noted.  Cluster of papules with surrounding redness on right posterior neck.  Psychiatric: He has a normal mood and affect. His behavior is normal. Thought content normal.  Nursing note and vitals reviewed.   BP (!) 142/78   Pulse 78   Temp 97.7 F (36.5 C)   Ht 6' (1.829 m)   Wt 230 lb (104.3 kg)   SpO2 95%   BMI 31.19 kg/m   Assessment and Plan: 1. Acute non-recurrent maxillary sinusitis - amoxicillin-clavulanate (AUGMENTIN) 875-125 MG tablet; Take 1 tablet by mouth 2 (two) times daily.  Dispense: 20 tablet; Refill: 0 - fluticasone (FLONASE) 50 MCG/ACT nasal spray; Place 2 sprays into both nostrils daily.  Dispense: 16 g; Refill: 6  2. Folliculitis - Ambulatory referral to Dermatology   Halina Maidens, MD [facility]  07/01/2016

## 2017-04-22 ENCOUNTER — Encounter: Payer: Self-pay | Admitting: Internal Medicine

## 2017-04-22 ENCOUNTER — Ambulatory Visit (INDEPENDENT_AMBULATORY_CARE_PROVIDER_SITE_OTHER): Payer: BC Managed Care – PPO | Admitting: Internal Medicine

## 2017-04-22 ENCOUNTER — Other Ambulatory Visit: Payer: Self-pay | Admitting: Internal Medicine

## 2017-04-22 VITALS — BP 118/64 | HR 76 | Temp 98.1°F | Ht 72.0 in | Wt 222.2 lb

## 2017-04-22 DIAGNOSIS — H6691 Otitis media, unspecified, right ear: Secondary | ICD-10-CM | POA: Diagnosis not present

## 2017-04-22 DIAGNOSIS — J0100 Acute maxillary sinusitis, unspecified: Secondary | ICD-10-CM

## 2017-04-22 DIAGNOSIS — F410 Panic disorder [episodic paroxysmal anxiety] without agoraphobia: Secondary | ICD-10-CM

## 2017-04-22 DIAGNOSIS — F17200 Nicotine dependence, unspecified, uncomplicated: Secondary | ICD-10-CM | POA: Diagnosis not present

## 2017-04-22 MED ORDER — FLUTICASONE PROPIONATE 50 MCG/ACT NA SUSP: 2.0000 | Freq: Every day | NASAL | 6 refills | Status: AC

## 2017-04-22 MED ORDER — AMOXICILLIN-POT CLAVULANATE 875-125 MG PO TABS: 1.0000 | ORAL_TABLET | Freq: Two times a day (BID) | ORAL | 0 refills | Status: DC

## 2017-04-22 MED ORDER — LORAZEPAM 0.5 MG PO TABS: 0.5000 mg | ORAL_TABLET | Freq: Every day | ORAL | 0 refills | Status: DC | PRN

## 2017-04-22 NOTE — Progress Notes (Signed)
Date:  04/22/2017   Name:  Harold Carroll   DOB:  1965-08-04   MRN:  [id number]   Chief Complaint: Sinusitis (Started 3 days ago. Taking claritin and not helping. No fever. A lot of facial pressure. Having green production when blowing nose. )  Sinusitis  This is a new problem. The current episode started in the past 7 days. The problem has been gradually worsening since onset. There has been no fever. Associated symptoms include congestion, coughing, ear pain, sinus pressure and a sore throat. Pertinent negatives include no chills, headaches or shortness of breath. Treatments tried: claritin. The treatment provided no relief.   Panic disorder - he has episodes of chest pain associated panic attack occurring intermittently. It usually occurs when he 7 argument or stressed over finances. He is use low-dose lorazepam in the past with benefit.  He had 4 tablets 18 months ago and needs a refill.  Tobacco use - patient had quit smoking but started again about 6 months ago he lost weight that he had gained and is reluctant to quit but knows that he needs to do so. He doesn't want any pharmaceutical assistance at this time.  Review of Systems  Constitutional: Negative for chills, fatigue and fever.  HENT: Positive for congestion, ear pain, postnasal drip, sinus pain, sinus pressure and sore throat.   Eyes: Negative for visual disturbance.  Respiratory: Positive for cough. Negative for chest tightness, shortness of breath and wheezing.   Cardiovascular: Negative for chest pain.  Gastrointestinal: Negative for abdominal pain.  Skin: Negative for rash and wound.  Neurological: Negative for dizziness and headaches.  Hematological: Negative for adenopathy.  Psychiatric/Behavioral: Negative for dysphoric mood and sleep disturbance. The patient is nervous/anxious.     Patient Active Problem List   Diagnosis Date Noted  . Benign essential HTN 10/02/2015  . Chest pain 10/02/2015  . Combined  fat and carbohydrate induced hyperlipemia 10/02/2015  . Overweight (BMI 25.0-29.9) 09/24/2015  . Smoker 09/24/2015  . Chest pain of uncertain etiology 82/80/0349  . Panic disorder 09/24/2015  . FH: heart disease 09/24/2015  . FH: diabetes mellitus 09/24/2015  . GERD (gastroesophageal reflux disease) 09/24/2015  . Seasonal allergic rhinitis 09/24/2015    Prior to Admission medications   Medication Sig Start Date End Date Taking? Authorizing Provider  fluticasone (FLONASE) 50 MCG/ACT nasal spray Place 2 sprays into both nostrils daily. 07/01/16  Yes Glean Hess, MD  loratadine (CLARITIN) 10 MG tablet Take 1 tablet (10 mg total) by mouth daily as needed for allergies. Take 1 tablet in the morning. As needed for itching. 09/24/15  Yes Plonk, Gwyndolyn Saxon, MD  Multiple Vitamin (MULTIVITAMIN) capsule Take 1 capsule by mouth daily.   Yes [provider]  ranitidine (ZANTAC) 75 MG tablet Take 150 mg by mouth 3 (three) times daily.    Yes [provider]    No Known Allergies  Past Surgical History:  Procedure Laterality Date  . INGUINAL HERNIA REPAIR Left 10/28/2015   Procedure: HERNIA REPAIR INGUINAL ADULT;  Surgeon: Hubbard Robinson, MD;  Location: [facility] ORS;  Service: General;  Laterality: Left;  . SKIN SURGERY     Tumor-benign  . TONSILLECTOMY AND ADENOIDECTOMY    . TUMOR REMOVAL    . VENTRAL HERNIA REPAIR  1968   Dr. Mare Ferrari    Social History  Substance Use Topics  . Smoking status: Former Smoker    Packs/day: 1.00    Types: Cigarettes    Start date:  10/22/1983    Quit date: 10/08/2015  . Smokeless tobacco: Never Used  . Alcohol use 4.8 oz/week    8 Standard drinks or equivalent per week     Medication list has been reviewed and updated.  PHQ 2/9 Scores 04/22/2017 09/24/2015  PHQ - 2 Score 0 0    Physical Exam  Constitutional: He is oriented to person, place, and time. He appears well-developed and well-nourished.  HENT:  Right Ear: External ear and  ear canal normal. Tympanic membrane is erythematous and retracted.  Left Ear: External ear and ear canal normal. Tympanic membrane is retracted. Tympanic membrane is not erythematous.  Nose: Right sinus exhibits maxillary sinus tenderness. Right sinus exhibits no frontal sinus tenderness. Left sinus exhibits maxillary sinus tenderness. Left sinus exhibits no frontal sinus tenderness.  Mouth/Throat: Uvula is midline and mucous membranes are normal. No oral lesions. Posterior oropharyngeal erythema present. No oropharyngeal exudate.  Neck: Normal range of motion. Neck supple.  Cardiovascular: Normal rate, regular rhythm and normal heart sounds.   Pulmonary/Chest: He has decreased breath sounds. He has no wheezes. He has no rales.  Lymphadenopathy:    He has no cervical adenopathy.  Neurological: He is alert and oriented to person, place, and time.    BP 118/64   Pulse 76   Temp 98.1 F (36.7 C) (Oral)   Ht 6' (1.829 m)   Wt 222 lb 3.2 oz (100.8 kg)   SpO2 97%   BMI 30.14 kg/m   Assessment and Plan: 1. Otitis of right ear - amoxicillin-clavulanate (AUGMENTIN) 875-125 MG tablet; Take 1 tablet by mouth 2 (two) times daily.  Dispense: 20 tablet; Refill: 0  2. Acute non-recurrent maxillary sinusitis - amoxicillin-clavulanate (AUGMENTIN) 875-125 MG tablet; Take 1 tablet by mouth 2 (two) times daily.  Dispense: 20 tablet; Refill: 0 - fluticasone (FLONASE) 50 MCG/ACT nasal spray; Place 2 sprays into both nostrils daily.  Dispense: 16 g; Refill: 6  3. Panic disorder Lorazepam low to use PRN - LORazepam (ATIVAN) 0.5 MG tablet; Take 1 tablet (0.5 mg total) by mouth daily as needed for anxiety.  Dispense: 10 tablet; Refill: 0  4. Smoker Discussed options and need to quit again   Meds ordered this encounter  Medications  . amoxicillin-clavulanate (AUGMENTIN) 875-125 MG tablet    Sig: Take 1 tablet by mouth 2 (two) times daily.    Dispense:  20 tablet    Refill:  0  . fluticasone  (FLONASE) 50 MCG/ACT nasal spray    Sig: Place 2 sprays into both nostrils daily.    Dispense:  16 g    Refill:  6  . LORazepam (ATIVAN) 0.5 MG tablet    Sig: Take 1 tablet (0.5 mg total) by mouth daily as needed for anxiety.    Dispense:  10 tablet    Refill:  0    Partially dictated using Editor, commissioning. Any errors are unintentional.  Halina Maidens, MD [facility]  04/22/2017

## 2017-05-16 ENCOUNTER — Ambulatory Visit (INDEPENDENT_AMBULATORY_CARE_PROVIDER_SITE_OTHER): Payer: BC Managed Care – PPO | Admitting: Internal Medicine

## 2017-05-16 ENCOUNTER — Encounter: Payer: Self-pay | Admitting: Internal Medicine

## 2017-05-16 VITALS — BP 120/80 | HR 102 | Temp 98.0°F | Ht 72.0 in | Wt 218.0 lb

## 2017-05-16 DIAGNOSIS — J40 Bronchitis, not specified as acute or chronic: Secondary | ICD-10-CM

## 2017-05-16 MED ORDER — GUAIFENESIN-CODEINE 100-10 MG/5ML PO SYRP: 5.0000 mL | ORAL_SOLUTION | Freq: Three times a day (TID) | ORAL | 0 refills | Status: DC | PRN

## 2017-05-16 MED ORDER — DOXYCYCLINE HYCLATE 100 MG PO TABS: 100.0000 mg | ORAL_TABLET | Freq: Two times a day (BID) | ORAL | 0 refills | Status: AC

## 2017-05-16 NOTE — Progress Notes (Signed)
Date:  05/16/2017   Name:  Harold Carroll   DOB:  [birth year]   MRN:  [id number]   Chief Complaint: chest congestion (2 X weeks felt like chest congestion. Girlfriend has URI- Coughing with little production. Feels like wheezing chest. No fever. ) URI   This is a new problem. The current episode started in the past 7 days. The problem has been unchanged. There has been no fever. Associated symptoms include congestion, coughing and wheezing. Pertinent negatives include no abdominal pain, chest pain or diarrhea.      Review of Systems  Constitutional: Negative for chills, fatigue, fever and unexpected weight change.  HENT: Positive for congestion and postnasal drip. Negative for trouble swallowing.   Respiratory: Positive for cough, chest tightness, shortness of breath and wheezing.   Cardiovascular: Negative for chest pain and palpitations.  Gastrointestinal: Negative for abdominal pain and diarrhea.    Patient Active Problem List   Diagnosis Date Noted  . Benign essential HTN 10/02/2015  . Chest pain 10/02/2015  . Combined fat and carbohydrate induced hyperlipemia 10/02/2015  . Overweight (BMI 25.0-29.9) 09/24/2015  . Smoker 09/24/2015  . Chest pain of uncertain etiology 69/48/5462  . Panic disorder 09/24/2015  . FH: heart disease 09/24/2015  . FH: diabetes mellitus 09/24/2015  . GERD (gastroesophageal reflux disease) 09/24/2015  . Seasonal allergic rhinitis 09/24/2015    Prior to Admission medications   Medication Sig Start Date End Date Taking? Authorizing Provider  fluticasone (FLONASE) 50 MCG/ACT nasal spray Place 2 sprays into both nostrils daily. 04/22/17  Yes Glean Hess, MD  loratadine (CLARITIN) 10 MG tablet Take 1 tablet (10 mg total) by mouth daily as needed for allergies. Take 1 tablet in the morning. As needed for itching. 09/24/15  Yes Plonk, Gwyndolyn Saxon, MD  LORazepam (ATIVAN) 0.5 MG tablet Take 1 tablet (0.5 mg total) by mouth daily as needed for  anxiety. 04/22/17  Yes Glean Hess, MD  Multiple Vitamin (MULTIVITAMIN) capsule Take 1 capsule by mouth daily.   Yes [provider]  ranitidine (ZANTAC) 75 MG tablet Take 150 mg by mouth 3 (three) times daily.    Yes [provider]    No Known Allergies  Past Surgical History:  Procedure Laterality Date  . INGUINAL HERNIA REPAIR Left 10/28/2015   Procedure: HERNIA REPAIR INGUINAL ADULT;  Surgeon: Hubbard Robinson, MD;  Location: [facility] ORS;  Service: General;  Laterality: Left;  . SKIN SURGERY     Tumor-benign  . TONSILLECTOMY AND ADENOIDECTOMY    . TUMOR REMOVAL    . VENTRAL HERNIA REPAIR  1968   Dr. Mare Ferrari    Social History  Substance Use Topics  . Smoking status: Current Every Day Smoker    Packs/day: 1.00    Types: Cigarettes    Start date: 10/22/1983    Last attempt to quit: 10/08/2015  . Smokeless tobacco: Never Used  . Alcohol use 4.8 oz/week    8 Standard drinks or equivalent per week     Medication list has been reviewed and updated.  PHQ 2/9 Scores 04/22/2017 09/24/2015  PHQ - 2 Score 0 0    Physical Exam  Constitutional: He is oriented to person, place, and time. He appears well-developed. No distress.  HENT:  Head: Normocephalic and atraumatic.  Nose: Right sinus exhibits no maxillary sinus tenderness. Left sinus exhibits no maxillary sinus tenderness.  Mouth/Throat: No posterior oropharyngeal edema or posterior oropharyngeal erythema.  Cardiovascular: Normal rate, regular rhythm and normal heart  sounds.   Pulmonary/Chest: Effort normal. No respiratory distress. He has decreased breath sounds. He has rhonchi.  Musculoskeletal: Normal range of motion.  Neurological: He is alert and oriented to person, place, and time.  Skin: Skin is warm and dry. No rash noted.  Psychiatric: He has a normal mood and affect. His behavior is normal. Thought content normal.  Nursing note and vitals reviewed.   BP 120/80   Pulse (!) 102   Temp 98 F  (36.7 C) (Oral)   Ht 6' (1.829 m)   Wt 218 lb (98.9 kg)   SpO2 97%   BMI 29.57 kg/m   Assessment and Plan: 1. Bronchitis Breo one inhalation daily - doxycycline (VIBRA-TABS) 100 MG tablet; Take 1 tablet (100 mg total) by mouth 2 (two) times daily.  Dispense: 20 tablet; Refill: 0 - guaiFENesin-codeine (ROBITUSSIN AC) 100-10 MG/5ML syrup; Take 5 mLs by mouth 3 (three) times daily as needed for cough.  Dispense: 150 mL; Refill: 0   Meds ordered this encounter  Medications  . doxycycline (VIBRA-TABS) 100 MG tablet    Sig: Take 1 tablet (100 mg total) by mouth 2 (two) times daily.    Dispense:  20 tablet    Refill:  0  . guaiFENesin-codeine (ROBITUSSIN AC) 100-10 MG/5ML syrup    Sig: Take 5 mLs by mouth 3 (three) times daily as needed for cough.    Dispense:  150 mL    Refill:  0    Partially dictated using Editor, commissioning. Any errors are unintentional.  Halina Maidens, MD [facility]  05/16/2017

## 2017-05-16 NOTE — Patient Instructions (Signed)
Use robitussin with codeine at night to aid in sleep  Use Breo - one inhalation daily for chest tightness

## 2017-10-26 ENCOUNTER — Ambulatory Visit (INDEPENDENT_AMBULATORY_CARE_PROVIDER_SITE_OTHER): Payer: BC Managed Care – PPO | Admitting: Internal Medicine

## 2017-10-26 ENCOUNTER — Encounter: Payer: Self-pay | Admitting: Internal Medicine

## 2017-10-26 VITALS — BP 140/92 | HR 86 | Temp 98.0°F | Ht 72.0 in | Wt 216.0 lb

## 2017-10-26 DIAGNOSIS — J0100 Acute maxillary sinusitis, unspecified: Secondary | ICD-10-CM | POA: Diagnosis not present

## 2017-10-26 MED ORDER — AMOXICILLIN-POT CLAVULANATE 875-125 MG PO TABS
1.0000 | ORAL_TABLET | Freq: Two times a day (BID) | ORAL | 0 refills | Status: DC
Start: 2017-10-26 — End: 2017-11-21

## 2017-10-26 NOTE — Progress Notes (Signed)
Date:  10/26/2017   Name:  Harold Carroll   DOB:  09-20-1965   MRN:  [id number]   Chief Complaint: Sinusitis (Started Sunday. Tried OTC meds and no relief. Sinus pressure in nose and eyes. No fever. ) Sinusitis  This is a new problem. The current episode started in the past 7 days. The problem has been rapidly worsening since onset. There has been no fever. The pain is mild. Associated symptoms include congestion, headaches and sinus pressure. Pertinent negatives include no chills or shortness of breath. Past treatments include oral decongestants.    Review of Systems  Constitutional: Negative for chills, fatigue and fever.  HENT: Positive for congestion and sinus pressure. Negative for trouble swallowing.   Eyes: Negative for visual disturbance.  Respiratory: Negative for choking, shortness of breath and wheezing.   Cardiovascular: Negative for chest pain and palpitations.  Neurological: Positive for headaches. Negative for dizziness, syncope and light-headedness.    Patient Active Problem List   Diagnosis Date Noted  . Benign essential HTN 10/02/2015  . Chest pain 10/02/2015  . Combined fat and carbohydrate induced hyperlipemia 10/02/2015  . Overweight (BMI 25.0-29.9) 09/24/2015  . Smoker 09/24/2015  . Chest pain of uncertain etiology 56/31/4970  . Panic disorder 09/24/2015  . FH: heart disease 09/24/2015  . FH: diabetes mellitus 09/24/2015  . GERD (gastroesophageal reflux disease) 09/24/2015  . Seasonal allergic rhinitis 09/24/2015    Prior to Admission medications   Medication Sig Start Date End Date Taking? Authorizing Provider  fluticasone (FLONASE) 50 MCG/ACT nasal spray Place 2 sprays into both nostrils daily. 04/22/17   Glean Hess, MD  loratadine (CLARITIN) 10 MG tablet Take 1 tablet (10 mg total) by mouth daily as needed for allergies. Take 1 tablet in the morning. As needed for itching. 09/24/15   Plonk, Gwyndolyn Saxon, MD  LORazepam (ATIVAN) 0.5 MG tablet Take  1 tablet (0.5 mg total) by mouth daily as needed for anxiety. 04/22/17   Glean Hess, MD  Multiple Vitamin (MULTIVITAMIN) capsule Take 1 capsule by mouth daily.    [provider]  ranitidine (ZANTAC) 75 MG tablet Take 150 mg by mouth 3 (three) times daily.     [provider]    No Known Allergies  Past Surgical History:  Procedure Laterality Date  . INGUINAL HERNIA REPAIR Left 10/28/2015   Procedure: HERNIA REPAIR INGUINAL ADULT;  Surgeon: Hubbard Robinson, MD;  Location: [facility] ORS;  Service: General;  Laterality: Left;  . SKIN SURGERY     Tumor-benign  . TONSILLECTOMY AND ADENOIDECTOMY    . TUMOR REMOVAL    . VENTRAL HERNIA REPAIR  1968   Dr. Mare Ferrari    Social History   Tobacco Use  . Smoking status: Current Every Day Smoker    Packs/day: 1.00    Types: Cigarettes    Start date: 10/22/1983    Last attempt to quit: 10/08/2015    Years since quitting: 2.0  . Smokeless tobacco: Never Used  Substance Use Topics  . Alcohol use: Yes    Alcohol/week: 4.8 oz    Types: 8 Standard drinks or equivalent per week  . Drug use: No     Medication list has been reviewed and updated.  PHQ 2/9 Scores 04/22/2017 09/24/2015  PHQ - 2 Score 0 0    Physical Exam  Constitutional: He is oriented to person, place, and time. He appears well-developed and well-nourished.  HENT:  Right Ear: External ear and ear canal normal. Tympanic  membrane is erythematous and retracted.  Left Ear: External ear and ear canal normal. Tympanic membrane is not erythematous and not retracted.  Nose: Right sinus exhibits frontal sinus tenderness. Right sinus exhibits no maxillary sinus tenderness. Left sinus exhibits frontal sinus tenderness. Left sinus exhibits no maxillary sinus tenderness.  Mouth/Throat: Uvula is midline and mucous membranes are normal. No oral lesions. Posterior oropharyngeal erythema present. No oropharyngeal exudate.  Cardiovascular: Normal rate, regular rhythm and normal  heart sounds.  Pulmonary/Chest: Breath sounds normal. He has no wheezes. He has no rales.  Lymphadenopathy:    He has no cervical adenopathy.  Neurological: He is alert and oriented to person, place, and time.    BP (!) 140/92   Pulse 86   Temp 98 F (36.7 C) (Oral)   Ht 6' (1.829 m)   Wt 216 lb (98 kg)   SpO2 96%   BMI 29.29 kg/m   Assessment and Plan: 1. Acute non-recurrent maxillary sinusitis Continue claritin and flonase - amoxicillin-clavulanate (AUGMENTIN) 875-125 MG tablet; Take 1 tablet by mouth 2 (two) times daily.  Dispense: 20 tablet; Refill: 0   Meds ordered this encounter  Medications  . amoxicillin-clavulanate (AUGMENTIN) 875-125 MG tablet    Sig: Take 1 tablet by mouth 2 (two) times daily.    Dispense:  20 tablet    Refill:  0    Partially dictated using Editor, commissioning. Any errors are unintentional.  Halina Maidens, MD [facility]  10/26/2017

## 2017-11-21 ENCOUNTER — Ambulatory Visit (INDEPENDENT_AMBULATORY_CARE_PROVIDER_SITE_OTHER): Payer: BC Managed Care – PPO | Admitting: Internal Medicine

## 2017-11-21 ENCOUNTER — Encounter: Payer: Self-pay | Admitting: Internal Medicine

## 2017-11-21 VITALS — BP 142/92 | HR 89 | Ht 72.0 in | Wt 214.0 lb

## 2017-11-21 DIAGNOSIS — Z1211 Encounter for screening for malignant neoplasm of colon: Secondary | ICD-10-CM | POA: Diagnosis not present

## 2017-11-21 DIAGNOSIS — Z125 Encounter for screening for malignant neoplasm of prostate: Secondary | ICD-10-CM | POA: Diagnosis not present

## 2017-11-21 DIAGNOSIS — E782 Mixed hyperlipidemia: Secondary | ICD-10-CM | POA: Diagnosis not present

## 2017-11-21 DIAGNOSIS — Z0000 Encounter for general adult medical examination without abnormal findings: Secondary | ICD-10-CM | POA: Diagnosis not present

## 2017-11-21 DIAGNOSIS — K219 Gastro-esophageal reflux disease without esophagitis: Secondary | ICD-10-CM | POA: Diagnosis not present

## 2017-11-21 DIAGNOSIS — E663 Overweight: Secondary | ICD-10-CM | POA: Diagnosis not present

## 2017-11-21 DIAGNOSIS — Z114 Encounter for screening for human immunodeficiency virus [HIV]: Secondary | ICD-10-CM | POA: Diagnosis not present

## 2017-11-21 DIAGNOSIS — F410 Panic disorder [episodic paroxysmal anxiety] without agoraphobia: Secondary | ICD-10-CM | POA: Diagnosis not present

## 2017-11-21 DIAGNOSIS — F17200 Nicotine dependence, unspecified, uncomplicated: Secondary | ICD-10-CM

## 2017-11-21 DIAGNOSIS — F17210 Nicotine dependence, cigarettes, uncomplicated: Secondary | ICD-10-CM | POA: Diagnosis not present

## 2017-11-21 LAB — POCT URINALYSIS DIPSTICK
Bilirubin, UA: NEGATIVE
Glucose, UA: NEGATIVE
Ketones, UA: NEGATIVE
LEUKOCYTES UA: NEGATIVE
NITRITE UA: NEGATIVE
PROTEIN UA: NEGATIVE
RBC UA: NEGATIVE
SPEC GRAV UA: 1.015 (ref 1.010–1.025)
Urobilinogen, UA: 0.2 E.U./dL
pH, UA: 6.5 (ref 5.0–8.0)

## 2017-11-21 MED ORDER — LORAZEPAM 0.5 MG PO TABS: 0.5000 mg | ORAL_TABLET | Freq: Every day | ORAL | 0 refills | Status: DC | PRN

## 2017-11-21 NOTE — Patient Instructions (Addendum)
Normal blood pressure goal 135/85 or less.  If it is consistently higher, follow up with me.  Steps to Quit Smoking Smoking tobacco can be bad for your health. It can also affect almost every organ in your body. Smoking puts you and people around you at risk for many serious long-lasting (chronic) diseases. Quitting smoking is hard, but it is one of the best things that you can do for your health. It is never too late to quit. What are the benefits of quitting smoking? When you quit smoking, you lower your risk for getting serious diseases and conditions. They can include:  Lung cancer or lung disease.  Heart disease.  Stroke.  Heart attack.  Not being able to have children (infertility).  Weak bones (osteoporosis) and broken bones (fractures).  If you have coughing, wheezing, and shortness of breath, those symptoms may get better when you quit. You may also get sick less often. If you are pregnant, quitting smoking can help to lower your chances of having a baby of low birth weight. What can I do to help me quit smoking? Talk with your doctor about what can help you quit smoking. Some things you can do (strategies) include:  Quitting smoking totally, instead of slowly cutting back how much you smoke over a period of time.  Going to in-person counseling. You are more likely to quit if you go to many counseling sessions.  Using resources and support systems, such as: ? Database administrator with a Social worker. ? Phone quitlines. ? Careers information officer. ? Support groups or group counseling. ? Text messaging programs. ? Mobile phone apps or applications.  Taking medicines. Some of these medicines may have nicotine in them. If you are pregnant or breastfeeding, do not take any medicines to quit smoking unless your doctor says it is okay. Talk with your doctor about counseling or other things that can help you.  Talk with your doctor about using more than one strategy at the same time,  such as taking medicines while you are also going to in-person counseling. This can help make quitting easier. What things can I do to make it easier to quit? Quitting smoking might feel very hard at first, but there is a lot that you can do to make it easier. Take these steps:  Talk to your family and friends. Ask them to support and encourage you.  Call phone quitlines, reach out to support groups, or work with a Social worker.  Ask people who smoke to not smoke around you.  Avoid places that make you want (trigger) to smoke, such as: ? Bars. ? Parties. ? Smoke-break areas at work.  Spend time with people who do not smoke.  Lower the stress in your life. Stress can make you want to smoke. Try these things to help your stress: ? Getting regular exercise. ? Deep-breathing exercises. ? Yoga. ? Meditating. ? Doing a body scan. To do this, close your eyes, focus on one area of your body at a time from head to toe, and notice which parts of your body are tense. Try to relax the muscles in those areas.  Download or buy apps on your mobile phone or tablet that can help you stick to your quit plan. There are many free apps, such as QuitGuide from the [employer] Office manager for Disease Control and Prevention). You can find more support from smokefree.gov and other websites.  This information is not intended to replace advice given to you by your health care  provider. Make sure you discuss any questions you have with your health care provider. Document Released: 05/15/2009 Document Revised: 03/16/2016 Document Reviewed: 12/03/2014 Elsevier Interactive Patient Education  2018 [employer].

## 2017-11-21 NOTE — Progress Notes (Signed)
Date:  11/21/2017   Name:  Harold Carroll   DOB:  12/21/1965   MRN:  [id number]   Chief Complaint: Annual Exam Harold Carroll is a 52 y.o. male who presents today for his Complete Annual Exam. He feels well. He reports exercising regularly. He reports he is sleeping fairly well. He is due for a colonoscopy.  He has never had PSA done - his brother has prostate cancer. He continues to smoke cigarettes and is not ready to attempt to quit at this time.  Gastroesophageal Reflux  He complains of heartburn. He reports no abdominal pain, no chest pain, no choking, no coughing or no wheezing. This is a recurrent problem. The problem occurs rarely. Pertinent negatives include no fatigue. He has tried a histamine-2 antagonist for the symptoms. The treatment provided significant relief.  Anxiety  Presents for follow-up visit. Patient reports no chest pain, dizziness or palpitations.     Review of Systems  Constitutional: Negative for chills, fatigue and fever.  HENT: Negative for hearing loss, tinnitus, trouble swallowing and voice change.   Eyes: Negative for visual disturbance.  Respiratory: Negative for cough, choking and wheezing.   Cardiovascular: Negative for chest pain and palpitations.  Gastrointestinal: Positive for heartburn. Negative for abdominal pain, constipation and diarrhea.  Genitourinary: Negative for difficulty urinating, frequency and urgency.  Musculoskeletal: Negative for arthralgias and gait problem.  Skin: Negative for rash.  Allergic/Immunologic: Positive for environmental allergies.  Neurological: Negative for dizziness and headaches.  Hematological: Negative for adenopathy. Does not bruise/bleed easily.  Psychiatric/Behavioral: Negative for sleep disturbance.    Patient Active Problem List   Diagnosis Date Noted  . Hyperlipidemia 10/02/2015  . Overweight (BMI 25.0-29.9) 09/24/2015  . Smoker 09/24/2015  . Panic disorder 09/24/2015  . FH: heart  disease 09/24/2015  . FH: diabetes mellitus 09/24/2015  . GERD (gastroesophageal reflux disease) 09/24/2015  . Seasonal allergic rhinitis 09/24/2015    Prior to Admission medications   Medication Sig Start Date End Date Taking? Authorizing Provider  fluticasone (FLONASE) 50 MCG/ACT nasal spray Place 2 sprays into both nostrils daily. 04/22/17   Glean Hess, MD  loratadine (CLARITIN) 10 MG tablet Take 1 tablet (10 mg total) by mouth daily as needed for allergies. Take 1 tablet in the morning. As needed for itching. 09/24/15   Plonk, Gwyndolyn Saxon, MD  LORazepam (ATIVAN) 0.5 MG tablet Take 1 tablet (0.5 mg total) by mouth daily as needed for anxiety. 04/22/17   Glean Hess, MD  Multiple Vitamin (MULTIVITAMIN) capsule Take 1 capsule by mouth daily.    [provider]  ranitidine (ZANTAC) 75 MG tablet Take 150 mg by mouth 3 (three) times daily.     [provider]    No Known Allergies  Past Surgical History:  Procedure Laterality Date  . INGUINAL HERNIA REPAIR Left 10/28/2015   Procedure: HERNIA REPAIR INGUINAL ADULT;  Surgeon: Hubbard Robinson, MD;  Location: [facility] ORS;  Service: General;  Laterality: Left;  . SKIN SURGERY     Tumor-benign  . TONSILLECTOMY AND ADENOIDECTOMY    . TUMOR REMOVAL    . VENTRAL HERNIA REPAIR  1968   Dr. Mare Ferrari    Social History   Tobacco Use  . Smoking status: Current Every Day Smoker    Packs/day: 1.00    Types: Cigarettes    Start date: 10/22/1983    Last attempt to quit: 10/08/2015    Years since quitting: 2.1  . Smokeless tobacco: Never Used  Substance Use Topics  . Alcohol use: Yes    Alcohol/week: 4.8 oz    Types: 8 Standard drinks or equivalent per week  . Drug use: No     Medication list has been reviewed and updated.  PHQ 2/9 Scores 04/22/2017 09/24/2015  PHQ - 2 Score 0 0    Physical Exam  Constitutional: He is oriented to person, place, and time. He appears well-developed and well-nourished.  HENT:  Head:  Normocephalic.  Right Ear: Tympanic membrane, external ear and ear canal normal.  Left Ear: Tympanic membrane, external ear and ear canal normal.  Nose: Nose normal.  Mouth/Throat: Uvula is midline and oropharynx is clear and moist.  Eyes: Pupils are equal, round, and reactive to light. Conjunctivae and EOM are normal.  Neck: Normal range of motion. Neck supple. Carotid bruit is not present. No thyromegaly present.  Cardiovascular: Normal rate, regular rhythm, normal heart sounds and intact distal pulses.  Pulmonary/Chest: Effort normal and breath sounds normal. He has no wheezes. Right breast exhibits no mass. Left breast exhibits no mass.  Abdominal: Soft. Normal appearance and bowel sounds are normal. There is no hepatosplenomegaly. There is no tenderness.  Musculoskeletal: Normal range of motion.       Feet:  Lymphadenopathy:    He has no cervical adenopathy.  Neurological: He is alert and oriented to person, place, and time. He has normal reflexes.  Skin: Skin is warm, dry and intact.  Psychiatric: He has a normal mood and affect. His speech is normal and behavior is normal. Judgment and thought content normal.  Nursing note and vitals reviewed.   BP (!) 142/92   Pulse 89   Ht 6' (1.829 m)   Wt 214 lb (97.1 kg)   SpO2 96%   BMI 29.02 kg/m   Assessment and Plan: 1. Annual physical exam Encourage regular exercise and smoking cessation - Comprehensive metabolic panel - POCT urinalysis dipstick  2. Gastroesophageal reflux disease, esophagitis presence not specified Controlled on H2 blocker - CBC with Differential/Platelet  3. Panic disorder Using ativan intermittently only - LORazepam (ATIVAN) 0.5 MG tablet; Take 1 tablet (0.5 mg total) by mouth daily as needed for anxiety.  Dispense: 10 tablet; Refill: 0  4. Smoker Discussed options - pt is not ready to quit at this time  5. Overweight (BMI 25.0-29.9) Work on diet and exercise  6. Mixed hyperlipidemia Check labs  and advise - Lipid panel  7. Prostate cancer screening DRE deferred - PSA  8. Colon cancer screening - Ambulatory referral to Gastroenterology  9. Encounter for screening for HIV - HIV antibody   Meds ordered this encounter  Medications  . LORazepam (ATIVAN) 0.5 MG tablet    Sig: Take 1 tablet (0.5 mg total) by mouth daily as needed for anxiety.    Dispense:  10 tablet    Refill:  0    Partially dictated using Editor, commissioning. Any errors are unintentional.  Halina Maidens, MD [facility]  11/21/2017

## 2017-11-22 LAB — CBC WITH DIFFERENTIAL/PLATELET
BASOS: 1 %
Basophils Absolute: 0.1 10*3/uL (ref 0.0–0.2)
EOS (ABSOLUTE): 0.4 10*3/uL (ref 0.0–0.4)
EOS: 4 %
HEMOGLOBIN: 19.3 g/dL — AB (ref 13.0–17.7)
Hematocrit: 54 % — ABNORMAL HIGH (ref 37.5–51.0)
Immature Grans (Abs): 0 10*3/uL (ref 0.0–0.1)
Immature Granulocytes: 0 %
LYMPHS ABS: 1.9 10*3/uL (ref 0.7–3.1)
Lymphs: 20 %
MCH: 31.8 pg (ref 26.6–33.0)
MCHC: 35.7 g/dL (ref 31.5–35.7)
MCV: 89 fL (ref 79–97)
MONOCYTES: 8 %
MONOS ABS: 0.7 10*3/uL (ref 0.1–0.9)
NEUTROS ABS: 6.5 10*3/uL (ref 1.4–7.0)
Neutrophils: 67 %
Platelets: 296 10*3/uL (ref 150–379)
RBC: 6.06 x10E6/uL — AB (ref 4.14–5.80)
RDW: 14.4 % (ref 12.3–15.4)
WBC: 9.5 10*3/uL (ref 3.4–10.8)

## 2017-11-22 LAB — LIPID PANEL
CHOL/HDL RATIO: 5 ratio (ref 0.0–5.0)
CHOLESTEROL TOTAL: 231 mg/dL — AB (ref 100–199)
HDL: 46 mg/dL (ref >39)
LDL CALC: 161 mg/dL — AB (ref 0–99)
Triglycerides: 119 mg/dL (ref 0–149)
VLDL Cholesterol Cal: 24 mg/dL (ref 5–40)

## 2017-11-22 LAB — COMPREHENSIVE METABOLIC PANEL
ALBUMIN: 4.5 g/dL (ref 3.5–5.5)
ALK PHOS: 130 IU/L — AB (ref 39–117)
ALT: 19 IU/L (ref 0–44)
AST: 31 IU/L (ref 0–40)
Albumin/Globulin Ratio: 1.7 (ref 1.2–2.2)
BUN / CREAT RATIO: 11 (ref 9–20)
BUN: 12 mg/dL (ref 6–24)
Bilirubin Total: 0.4 mg/dL (ref 0.0–1.2)
CO2: 21 mmol/L (ref 20–29)
CREATININE: 1.1 mg/dL (ref 0.76–1.27)
Calcium: 9.5 mg/dL (ref 8.7–10.2)
Chloride: 97 mmol/L (ref 96–106)
GFR calc Af Amer: 89 mL/min/{1.73_m2} (ref >59)
GFR, EST NON AFRICAN AMERICAN: 77 mL/min/{1.73_m2} (ref >59)
GLOBULIN, TOTAL: 2.7 g/dL (ref 1.5–4.5)
Glucose: 84 mg/dL (ref 65–99)
POTASSIUM: 4.7 mmol/L (ref 3.5–5.2)
SODIUM: 138 mmol/L (ref 134–144)
Total Protein: 7.2 g/dL (ref 6.0–8.5)

## 2017-11-22 LAB — PSA: Prostate Specific Ag, Serum: 1.1 ng/mL (ref 0.0–4.0)

## 2017-11-22 LAB — HIV ANTIBODY (ROUTINE TESTING W REFLEX): HIV SCREEN 4TH GENERATION: NONREACTIVE

## 2017-11-24 ENCOUNTER — Other Ambulatory Visit: Payer: Self-pay | Admitting: Internal Medicine

## 2017-11-24 MED ORDER — ATORVASTATIN CALCIUM 10 MG PO TABS: 10.0000 mg | ORAL_TABLET | Freq: Every day | ORAL | 1 refills | Status: DC

## 2017-11-24 NOTE — Progress Notes (Signed)
Patient informed of labs. Agrees to start new cholesterol medication. Wants sent to [facility] in [place]. Also, agrees to see a hematologist for RBC being high.

## 2017-12-07 ENCOUNTER — Telehealth: Payer: Self-pay

## 2017-12-07 NOTE — Telephone Encounter (Signed)
Cholesterol meds are causing joint pain

## 2017-12-07 NOTE — Telephone Encounter (Signed)
Okay but he could try adding CoEnzyme Q10 100-200 mg per day to counteract the side effects.  Otherwise, work very hard on low fat diet and weight loss.

## 2017-12-22 ENCOUNTER — Encounter: Payer: Self-pay | Admitting: *Deleted

## 2018-11-27 ENCOUNTER — Encounter: Payer: BC Managed Care – PPO | Admitting: Internal Medicine

## 2019-01-24 ENCOUNTER — Ambulatory Visit: Payer: BC Managed Care – PPO | Admitting: Internal Medicine

## 2019-01-24 ENCOUNTER — Other Ambulatory Visit: Payer: Self-pay

## 2019-01-24 ENCOUNTER — Encounter: Payer: Self-pay | Admitting: Internal Medicine

## 2019-01-24 VITALS — BP 136/86 | HR 71 | Ht 72.0 in | Wt 220.0 lb

## 2019-01-24 DIAGNOSIS — K219 Gastro-esophageal reflux disease without esophagitis: Secondary | ICD-10-CM

## 2019-01-24 DIAGNOSIS — Z1211 Encounter for screening for malignant neoplasm of colon: Secondary | ICD-10-CM

## 2019-01-24 DIAGNOSIS — Z0000 Encounter for general adult medical examination without abnormal findings: Secondary | ICD-10-CM | POA: Diagnosis not present

## 2019-01-24 DIAGNOSIS — R21 Rash and other nonspecific skin eruption: Secondary | ICD-10-CM

## 2019-01-24 DIAGNOSIS — F17200 Nicotine dependence, unspecified, uncomplicated: Secondary | ICD-10-CM

## 2019-01-24 DIAGNOSIS — Z125 Encounter for screening for malignant neoplasm of prostate: Secondary | ICD-10-CM

## 2019-01-24 DIAGNOSIS — E782 Mixed hyperlipidemia: Secondary | ICD-10-CM | POA: Diagnosis not present

## 2019-01-24 LAB — POCT URINALYSIS DIPSTICK
Bilirubin, UA: NEGATIVE
Blood, UA: NEGATIVE
Glucose, UA: NEGATIVE
Ketones, UA: NEGATIVE
Leukocytes, UA: NEGATIVE
Nitrite, UA: NEGATIVE
Protein, UA: NEGATIVE
Spec Grav, UA: 1.01 (ref 1.010–1.025)
Urobilinogen, UA: 0.2 E.U./dL
pH, UA: 6 (ref 5.0–8.0)

## 2019-01-24 MED ORDER — FLUCONAZOLE 100 MG PO TABS: 100.0000 mg | ORAL_TABLET | Freq: Every day | ORAL | 0 refills | Status: AC

## 2019-01-24 NOTE — Progress Notes (Signed)
Date:  01/24/2019   Name:  Harold Carroll   DOB:  1966-07-06   MRN:  [id number]   Chief Complaint: Annual Exam Harold Carroll is a 53 y.o. male who presents today for his Complete Annual Exam. He feels fairly well. He reports exercising none recently due to covid and gym closing.  He walks at work. He reports he is sleeping fairly well.   Colonoscopy - none (referred last year but never done)  Gastroesophageal Reflux He complains of heartburn. He reports no abdominal pain, no chest pain, no choking or no wheezing. This is a recurrent problem. The problem occurs occasionally. Pertinent negatives include no fatigue. He has tried a PPI for the symptoms. The treatment provided significant relief.  Hyperlipidemia This is a chronic problem. The problem is uncontrolled. Pertinent negatives include no chest pain, myalgias or shortness of breath. Current antihyperlipidemic treatment includes statins (atorvastatin added last year). Compliance problems include medication side effects (stopped meds due to joint pain).   Elevated RBC count - noted last year but never referred to Hematology as planned. Tobacco use - he has quit in the past with nicotine replacement therapy.  He is not ready to quit at this time but is considering it.  He will call if he needs assistance.  Lab Results  Component Value Date   CHOL 231 (H) 11/21/2017   HDL 46 11/21/2017   LDLCALC 161 (H) 11/21/2017   TRIG 119 11/21/2017   CHOLHDL 5.0 11/21/2017   Lab Results  Component Value Date   WBC 9.5 11/21/2017   HGB 19.3 (H) 11/21/2017   HCT 54.0 (H) 11/21/2017   MCV 89 11/21/2017   PLT 296 11/21/2017   Lab Results  Component Value Date   CREATININE 1.10 11/21/2017   BUN 12 11/21/2017   NA 138 11/21/2017   K 4.7 11/21/2017   CL 97 11/21/2017   CO2 21 11/21/2017     Review of Systems  Constitutional: Negative for appetite change, chills, diaphoresis, fatigue and unexpected weight change.  HENT:  Positive for postnasal drip. Negative for hearing loss, tinnitus, trouble swallowing and voice change.   Eyes: Negative for visual disturbance.  Respiratory: Negative for choking, shortness of breath and wheezing.   Cardiovascular: Negative for chest pain, palpitations and leg swelling.  Gastrointestinal: Positive for heartburn. Negative for abdominal pain, blood in stool, constipation and diarrhea.  Genitourinary: Negative for difficulty urinating, dysuria, frequency and hematuria.  Musculoskeletal: Negative for arthralgias, back pain and myalgias.  Skin: Negative for color change and rash.  Allergic/Immunologic: Positive for environmental allergies.  Neurological: Negative for dizziness, syncope and headaches.  Hematological: Negative for adenopathy.  Psychiatric/Behavioral: Negative for dysphoric mood and sleep disturbance.    Patient Active Problem List   Diagnosis Date Noted  . Mixed hyperlipidemia 10/02/2015  . Overweight (BMI 25.0-29.9) 09/24/2015  . Tobacco use disorder 09/24/2015  . Panic disorder 09/24/2015  . FH: heart disease 09/24/2015  . FH: diabetes mellitus 09/24/2015  . GERD (gastroesophageal reflux disease) 09/24/2015  . Seasonal allergic rhinitis 09/24/2015    No Known Allergies  Past Surgical History:  Procedure Laterality Date  . INGUINAL HERNIA REPAIR Left 10/28/2015   Procedure: HERNIA REPAIR INGUINAL ADULT;  Surgeon: Hubbard Robinson, MD;  Location: [facility] ORS;  Service: General;  Laterality: Left;  . SKIN SURGERY     Tumor-benign  . TONSILLECTOMY AND ADENOIDECTOMY    . TUMOR REMOVAL    . VENTRAL HERNIA REPAIR  1968   Dr.  Jognston    Social History   Tobacco Use  . Smoking status: Current Every Day Smoker    Packs/day: 1.00    Years: 30.00    Pack years: 30.00    Types: Cigarettes    Start date: 10/22/1983    Last attempt to quit: 10/08/2015    Years since quitting: 3.2  . Smokeless tobacco: Never Used  Substance Use Topics  . Alcohol use: Yes     Alcohol/week: 8.0 standard drinks    Types: 8 Standard drinks or equivalent per week  . Drug use: No     Medication list has been reviewed and updated.  Current Meds  Medication Sig  . cetirizine (ZYRTEC) 10 MG tablet Take 10 mg by mouth daily.  . fluticasone (FLONASE) 50 MCG/ACT nasal spray Place 2 sprays into both nostrils daily.  . Garlic 4580 MG CAPS Take by mouth.  Marland Kitchen LORazepam (ATIVAN) 0.5 MG tablet Take 1 tablet (0.5 mg total) by mouth daily as needed for anxiety.  . Multiple Vitamin (MULTIVITAMIN) capsule Take 1 capsule by mouth daily.  Marland Kitchen omeprazole (PRILOSEC) 10 MG capsule Take 10 mg by mouth daily.    PHQ 2/9 Scores 01/24/2019 04/22/2017 09/24/2015  PHQ - 2 Score 0 0 0    BP Readings from Last 3 Encounters:  01/24/19 136/86  11/21/17 (!) 142/92  10/26/17 (!) 140/92    Physical Exam Vitals signs and nursing note reviewed.  Constitutional:      Appearance: Normal appearance. He is well-developed.  HENT:     Head: Normocephalic.     Right Ear: Tympanic membrane, ear canal and external ear normal.     Left Ear: Tympanic membrane, ear canal and external ear normal.     Nose: Nose normal.     Mouth/Throat:     Pharynx: Uvula midline.  Eyes:     Conjunctiva/sclera: Conjunctivae normal.     Pupils: Pupils are equal, round, and reactive to light.  Neck:     Musculoskeletal: Normal range of motion and neck supple.     Thyroid: No thyromegaly.     Vascular: No carotid bruit.  Cardiovascular:     Rate and Rhythm: Normal rate and regular rhythm.     Heart sounds: Normal heart sounds. No murmur.  Pulmonary:     Effort: Pulmonary effort is normal.     Breath sounds: Normal breath sounds. No wheezing.  Chest:     Breasts:        Right: No mass.        Left: No mass.  Abdominal:     General: Bowel sounds are normal.     Palpations: Abdomen is soft.     Tenderness: There is no abdominal tenderness.  Musculoskeletal: Normal range of motion.     Right lower leg: No  edema.     Left lower leg: No edema.  Lymphadenopathy:     Cervical: No cervical adenopathy.  Skin:    General: Skin is warm and dry.     Capillary Refill: Capillary refill takes less than 2 seconds.     Findings: Rash (possible folliculitis) present.     Comments: Scattered macular 2 mm lesion on both LEs  Neurological:     Mental Status: He is alert and oriented to person, place, and time.     Deep Tendon Reflexes: Reflexes are normal and symmetric.  Psychiatric:        Speech: Speech normal.        Behavior: Behavior  normal.        Thought Content: Thought content normal.        Judgment: Judgment normal.     Wt Readings from Last 3 Encounters:  01/24/19 220 lb (99.8 kg)  11/21/17 214 lb (97.1 kg)  10/26/17 216 lb (98 kg)    BP 136/86   Pulse 71   Ht 6' (1.829 m)   Wt 220 lb (99.8 kg)   SpO2 96%   BMI 29.84 kg/m   Assessment and Plan: 1. Annual physical exam Normal exam BP borderline - pt to monitor regularly at home - POCT urinalysis dipstick  2. Colon cancer screening - Ambulatory referral to Gastroenterology  3. Prostate cancer screening DRE deferred to lack of sx - PSA  4. Mixed hyperlipidemia He agrees to try another medication if needed - Comprehensive metabolic panel - Lipid panel  5. Gastroesophageal reflux disease, esophagitis presence not specified Continue PPI - CBC with Differential/Platelet  6. Tobacco use disorder Discussed options for quitting He is not ready yet to attemp  7. Rash Possible fungal folliculitis - fluconazole (DIFLUCAN) 100 MG tablet; Take 1 tablet (100 mg total) by mouth daily for 7 days.  Dispense: 7 tablet; Refill: 0   Partially dictated using Editor, commissioning. Any errors are unintentional.  Halina Maidens, MD [facility]  01/24/2019

## 2019-01-25 ENCOUNTER — Other Ambulatory Visit: Payer: Self-pay | Admitting: Internal Medicine

## 2019-01-25 DIAGNOSIS — D751 Secondary polycythemia: Secondary | ICD-10-CM

## 2019-01-25 DIAGNOSIS — E782 Mixed hyperlipidemia: Secondary | ICD-10-CM

## 2019-01-25 LAB — CBC WITH DIFFERENTIAL/PLATELET
Basophils Absolute: 0.1 10*3/uL (ref 0.0–0.2)
Basos: 1 %
EOS (ABSOLUTE): 0.3 10*3/uL (ref 0.0–0.4)
Eos: 3 %
Hematocrit: 60.7 % — ABNORMAL HIGH (ref 37.5–51.0)
Hemoglobin: 20.1 g/dL (ref 13.0–17.7)
Immature Grans (Abs): 0 10*3/uL (ref 0.0–0.1)
Immature Granulocytes: 0 %
Lymphocytes Absolute: 2 10*3/uL (ref 0.7–3.1)
Lymphs: 21 %
MCH: 31.2 pg (ref 26.6–33.0)
MCHC: 33.1 g/dL (ref 31.5–35.7)
MCV: 94 fL (ref 79–97)
Monocytes Absolute: 0.7 10*3/uL (ref 0.1–0.9)
Monocytes: 7 %
Neutrophils Absolute: 6.3 10*3/uL (ref 1.4–7.0)
Neutrophils: 68 %
Platelets: 318 10*3/uL (ref 150–450)
RBC: 6.44 x10E6/uL — ABNORMAL HIGH (ref 4.14–5.80)
RDW: 12.7 % (ref 11.6–15.4)
WBC: 9.4 10*3/uL (ref 3.4–10.8)

## 2019-01-25 LAB — PSA: Prostate Specific Ag, Serum: 0.7 ng/mL (ref 0.0–4.0)

## 2019-01-25 LAB — COMPREHENSIVE METABOLIC PANEL
ALT: 30 IU/L (ref 0–44)
AST: 25 IU/L (ref 0–40)
Albumin/Globulin Ratio: 1.8 (ref 1.2–2.2)
Albumin: 4.4 g/dL (ref 3.8–4.9)
Alkaline Phosphatase: 112 IU/L (ref 39–117)
BUN/Creatinine Ratio: 9 (ref 9–20)
BUN: 10 mg/dL (ref 6–24)
Bilirubin Total: 0.9 mg/dL (ref 0.0–1.2)
CO2: 21 mmol/L (ref 20–29)
Calcium: 9.8 mg/dL (ref 8.7–10.2)
Chloride: 100 mmol/L (ref 96–106)
Creatinine, Ser: 1.07 mg/dL (ref 0.76–1.27)
GFR calc Af Amer: 92 mL/min/{1.73_m2} (ref >59)
GFR calc non Af Amer: 79 mL/min/{1.73_m2} (ref >59)
Globulin, Total: 2.4 g/dL (ref 1.5–4.5)
Glucose: 104 mg/dL — ABNORMAL HIGH (ref 65–99)
Potassium: 5 mmol/L (ref 3.5–5.2)
Sodium: 137 mmol/L (ref 134–144)
Total Protein: 6.8 g/dL (ref 6.0–8.5)

## 2019-01-25 LAB — LIPID PANEL
Chol/HDL Ratio: 5.9 ratio — ABNORMAL HIGH (ref 0.0–5.0)
Cholesterol, Total: 236 mg/dL — ABNORMAL HIGH (ref 100–199)
HDL: 40 mg/dL (ref >39)
LDL Calculated: 159 mg/dL — ABNORMAL HIGH (ref 0–99)
Triglycerides: 186 mg/dL — ABNORMAL HIGH (ref 0–149)
VLDL Cholesterol Cal: 37 mg/dL (ref 5–40)

## 2019-01-25 MED ORDER — SIMVASTATIN 10 MG PO TABS: 10.0000 mg | ORAL_TABLET | Freq: Every day | ORAL | 3 refills | Status: DC

## 2019-02-14 ENCOUNTER — Inpatient Hospital Stay: Payer: BC Managed Care – PPO | Admitting: Oncology

## 2019-02-19 ENCOUNTER — Encounter: Payer: BC Managed Care – PPO | Admitting: Oncology

## 2019-02-19 ENCOUNTER — Other Ambulatory Visit: Payer: Self-pay

## 2019-02-19 ENCOUNTER — Telehealth: Payer: Self-pay

## 2019-02-19 ENCOUNTER — Inpatient Hospital Stay: Payer: BC Managed Care – PPO

## 2019-02-19 ENCOUNTER — Inpatient Hospital Stay: Payer: BC Managed Care – PPO | Attending: Oncology | Admitting: Oncology

## 2019-02-19 VITALS — BP 138/83 | HR 71 | Temp 98.0°F | Resp 20

## 2019-02-19 DIAGNOSIS — Z1211 Encounter for screening for malignant neoplasm of colon: Secondary | ICD-10-CM

## 2019-02-19 DIAGNOSIS — D751 Secondary polycythemia: Secondary | ICD-10-CM | POA: Insufficient documentation

## 2019-02-19 DIAGNOSIS — Z79899 Other long term (current) drug therapy: Secondary | ICD-10-CM | POA: Diagnosis not present

## 2019-02-19 DIAGNOSIS — E785 Hyperlipidemia, unspecified: Secondary | ICD-10-CM | POA: Insufficient documentation

## 2019-02-19 DIAGNOSIS — F17210 Nicotine dependence, cigarettes, uncomplicated: Secondary | ICD-10-CM

## 2019-02-19 DIAGNOSIS — E669 Obesity, unspecified: Secondary | ICD-10-CM | POA: Insufficient documentation

## 2019-02-19 LAB — CBC WITH DIFFERENTIAL/PLATELET
Abs Immature Granulocytes: 0.03 10*3/uL (ref 0.00–0.07)
Basophils Absolute: 0.1 10*3/uL (ref 0.0–0.1)
Basophils Relative: 1 %
Eosinophils Absolute: 0.4 10*3/uL (ref 0.0–0.5)
Eosinophils Relative: 4 %
HCT: 55.2 % — ABNORMAL HIGH (ref 39.0–52.0)
Hemoglobin: 19.1 g/dL — ABNORMAL HIGH (ref 13.0–17.0)
Immature Granulocytes: 0 %
Lymphocytes Relative: 26 %
Lymphs Abs: 2.8 10*3/uL (ref 0.7–4.0)
MCH: 31.4 pg (ref 26.0–34.0)
MCHC: 34.6 g/dL (ref 30.0–36.0)
MCV: 90.6 fL (ref 80.0–100.0)
Monocytes Absolute: 0.8 10*3/uL (ref 0.1–1.0)
Monocytes Relative: 7 %
Neutro Abs: 6.9 10*3/uL (ref 1.7–7.7)
Neutrophils Relative %: 62 %
Platelets: 302 10*3/uL (ref 150–400)
RBC: 6.09 MIL/uL — ABNORMAL HIGH (ref 4.22–5.81)
RDW: 12.7 % (ref 11.5–15.5)
WBC: 11 10*3/uL — ABNORMAL HIGH (ref 4.0–10.5)
nRBC: 0 % (ref 0.0–0.2)

## 2019-02-19 LAB — URINALYSIS, COMPLETE (UACMP) WITH MICROSCOPIC
Bacteria, UA: NONE SEEN
Bilirubin Urine: NEGATIVE
Glucose, UA: NEGATIVE mg/dL
Hgb urine dipstick: NEGATIVE
Ketones, ur: NEGATIVE mg/dL
Nitrite: NEGATIVE
Protein, ur: NEGATIVE mg/dL
Specific Gravity, Urine: 1.018 (ref 1.005–1.030)
pH: 5 (ref 5.0–8.0)

## 2019-02-19 NOTE — Telephone Encounter (Signed)
Gastroenterology Pre-Procedure Review  Request Date: 04/16/19 Requesting Physician: Dr. Allen Norris  PATIENT REVIEW QUESTIONS: The patient responded to the following health history questions as indicated:    1. Are you having any GI issues? no 2. Do you have a personal history of Polyps? no 3. Do you have a family history of Colon Cancer or Polyps? no 4. Diabetes Mellitus? no 5. Joint replacements in the past 12 months?no 6. Major health problems in the past 3 months?no 7. Any artificial heart valves, MVP, or defibrillator?no    MEDICATIONS & ALLERGIES:    Patient reports the following regarding taking any anticoagulation/antiplatelet therapy:   Plavix, Coumadin, Eliquis, Xarelto, Lovenox, Pradaxa, Brilinta, or Effient? no Aspirin? no  Patient confirms/reports the following medications:  Current Outpatient Medications  Medication Sig Dispense Refill  . cetirizine (ZYRTEC) 10 MG tablet Take 10 mg by mouth daily.    . fluticasone (FLONASE) 50 MCG/ACT nasal spray Place 2 sprays into both nostrils daily. 16 g 6  . Garlic 5537 MG CAPS Take by mouth.    Marland Kitchen LORazepam (ATIVAN) 0.5 MG tablet Take 1 tablet (0.5 mg total) by mouth daily as needed for anxiety. 10 tablet 0  . Multiple Vitamin (MULTIVITAMIN) capsule Take 1 capsule by mouth daily.    Marland Kitchen omeprazole (PRILOSEC) 10 MG capsule Take 10 mg by mouth daily.    . simvastatin (ZOCOR) 10 MG tablet Take 1 tablet (10 mg total) by mouth at bedtime. 90 tablet 3   No current facility-administered medications for this visit.     Patient confirms/reports the following allergies:  No Known Allergies  No orders of the defined types were placed in this encounter.   AUTHORIZATION INFORMATION Primary Insurance: 1D#: Group #:  Secondary Insurance: 1D#: Group #:  SCHEDULE INFORMATION: Date: 04/16/19 Time: Location:[facility]

## 2019-02-19 NOTE — Progress Notes (Signed)
New Patient evaluation, no complaints or concerns

## 2019-02-20 ENCOUNTER — Encounter: Payer: Self-pay | Admitting: Oncology

## 2019-02-20 ENCOUNTER — Other Ambulatory Visit: Payer: Self-pay | Admitting: *Deleted

## 2019-02-20 DIAGNOSIS — D751 Secondary polycythemia: Secondary | ICD-10-CM

## 2019-02-20 LAB — ERYTHROPOIETIN: Erythropoietin: 7.3 m[IU]/mL (ref 2.6–18.5)

## 2019-02-20 NOTE — Progress Notes (Signed)
Hematology/Oncology Consult note [facility] Telephone:([phone] [phone]  Patient Care Team: Glean Hess, MD as PCP - General (Internal Medicine)   Name of the patient: Harold Carroll  [id number]  Mar 30, 1966    Reason for referral-polycythemia   Referring physician-Dr. Army Melia  Date of visit: 02/20/19   History of presenting illness-  Patient is a 53 year old male with a past medical history significant for obesity, hyperlipidemia who has been referred to [place] for polycythemia.  Of note patient has had an elevated hemoglobin dating back to 2016 and his yearly hemoglobin trends have been 17.8, 18.8, 19.3 and then most recently 20.1 on 01/24/2019.  Hemoglobin and platelet counts have been normal.  He does have a chronic smoking history and currently smokes 1 pack/day and has been doing so for the last 30 years.  He denies any exogenous testosterone use.  He does report getting a good night sleep and wakes up in the morning feeling refreshed but does note that his partner has noticed that he snores at night.  He does not have any known chronic lung disease.  Overall he feels well and denies any symptoms of recurrent infections, unintentional weight loss.  Denies any blood in his urine  ECOG PS- 0  Pain scale- 0   Review of systems- Review of Systems  Constitutional: Negative for chills, fever, malaise/fatigue and weight loss.  HENT: Negative for congestion, ear discharge and nosebleeds.   Eyes: Negative for blurred vision.  Respiratory: Negative for cough, hemoptysis, sputum production, shortness of breath and wheezing.   Cardiovascular: Negative for chest pain, palpitations, orthopnea and claudication.  Gastrointestinal: Negative for abdominal pain, blood in stool, constipation, diarrhea, heartburn, melena, nausea and vomiting.  Genitourinary: Negative for dysuria, flank pain, frequency, hematuria and urgency.  Musculoskeletal: Negative  for back pain, joint pain and myalgias.  Skin: Negative for rash.  Neurological: Negative for dizziness, tingling, focal weakness, seizures, weakness and headaches.  Endo/Heme/Allergies: Does not bruise/bleed easily.  Psychiatric/Behavioral: Negative for depression and suicidal ideas. The patient does not have insomnia.     No Known Allergies  Patient Active Problem List   Diagnosis Date Noted  . Polycythemia 02/19/2019  . Erythrocytosis 01/25/2019  . Mixed hyperlipidemia 10/02/2015  . Overweight (BMI 25.0-29.9) 09/24/2015  . Tobacco use disorder 09/24/2015  . Panic disorder 09/24/2015  . FH: heart disease 09/24/2015  . FH: diabetes mellitus 09/24/2015  . GERD (gastroesophageal reflux disease) 09/24/2015  . Seasonal allergic rhinitis 09/24/2015     Past Medical History:  Diagnosis Date  . GERD (gastroesophageal reflux disease)   . Panic attacks      Past Surgical History:  Procedure Laterality Date  . INGUINAL HERNIA REPAIR Left 10/28/2015   Procedure: HERNIA REPAIR INGUINAL ADULT;  Surgeon: Hubbard Robinson, MD;  Location: [facility] ORS;  Service: General;  Laterality: Left;  . SKIN SURGERY     Tumor-benign  . TONSILLECTOMY AND ADENOIDECTOMY    . TUMOR REMOVAL    . VENTRAL HERNIA REPAIR  1968   Dr. Mare Ferrari    Social History   Socioeconomic History  . Marital status: Divorced    Spouse name: Not on file  . Number of children: Not on file  . Years of education: Not on file  . Highest education level: Not on file  Occupational History  . Not on file  Social Needs  . Financial resource strain: Not on file  . Food insecurity    Worry: Not on file  Inability: Not on file  . Transportation needs    Medical: Not on file    Non-medical: Not on file  Tobacco Use  . Smoking status: Current Every Day Smoker    Packs/day: 1.00    Years: 30.00    Pack years: 30.00    Types: Cigarettes    Start date: 10/22/1983    Last attempt to quit: 10/08/2015    Years since  quitting: 3.3  . Smokeless tobacco: Never Used  Substance and Sexual Activity  . Alcohol use: Yes    Alcohol/week: 8.0 standard drinks    Types: 8 Standard drinks or equivalent per week  . Drug use: No  . Sexual activity: Not on file  Lifestyle  . Physical activity    Days per week: Not on file    Minutes per session: Not on file  . Stress: Not on file  Relationships  . Social Herbalist on phone: Not on file    Gets together: Not on file    Attends religious service: Not on file    Active member of club or organization: Not on file    Attends meetings of clubs or organizations: Not on file    Relationship status: Not on file  . Intimate partner violence    Fear of current or ex partner: Not on file    Emotionally abused: Not on file    Physically abused: Not on file    Forced sexual activity: Not on file  Other Topics Concern  . Not on file  Social History Narrative  . Not on file     Family History  Problem Relation Age of Onset  . Thyroid disease Mother   . Heart disease Father        heart attack  . Diabetes Father   . Lung cancer Maternal Aunt   . Cancer Maternal Aunt        lung cancer  . Heart disease Paternal Uncle   . Cancer Maternal Grandfather        lung cancer  . Prostate cancer Brother 87  . Lupus Brother      Current Outpatient Medications:  .  cetirizine (ZYRTEC) 10 MG tablet, Take 10 mg by mouth daily., Disp: , Rfl:  .  fluticasone (FLONASE) 50 MCG/ACT nasal spray, Place 2 sprays into both nostrils daily., Disp: 16 g, Rfl: 6 .  LORazepam (ATIVAN) 0.5 MG tablet, Take 1 tablet (0.5 mg total) by mouth daily as needed for anxiety., Disp: 10 tablet, Rfl: 0 .  Multiple Vitamin (MULTIVITAMIN) capsule, Take 1 capsule by mouth daily., Disp: , Rfl:  .  omeprazole (PRILOSEC) 10 MG capsule, Take 10 mg by mouth daily., Disp: , Rfl:  .  simvastatin (ZOCOR) 10 MG tablet, Take 1 tablet (10 mg total) by mouth at bedtime., Disp: 90 tablet, Rfl: 3 .   Garlic 4315 MG CAPS, Take by mouth., Disp: , Rfl:    Physical exam:  Vitals:   02/19/19 1341  BP: 138/83  Pulse: 71  Resp: 20  Temp: 98 F (36.7 C)  TempSrc: Tympanic   Physical Exam Constitutional:      Appearance: He is obese.     Comments: Face appears plethoric  HENT:     Head: Normocephalic and atraumatic.  Eyes:     Pupils: Pupils are equal, round, and reactive to light.  Neck:     Musculoskeletal: Normal range of motion.  Cardiovascular:     Rate and Rhythm:  Normal rate and regular rhythm.     Heart sounds: Normal heart sounds.  Pulmonary:     Effort: Pulmonary effort is normal.     Breath sounds: Normal breath sounds.  Abdominal:     General: Bowel sounds are normal.     Palpations: Abdomen is soft.     Comments: No palpable splenomegaly  Skin:    General: Skin is warm and dry.  Neurological:     Mental Status: He is alert and oriented to person, place, and time.        CMP Latest Ref Rng & Units 01/24/2019  Glucose 65 - 99 mg/dL 104(H)  BUN 6 - 24 mg/dL 10  Creatinine 0.76 - 1.27 mg/dL 1.07  Sodium 134 - 144 mmol/L 137  Potassium 3.5 - 5.2 mmol/L 5.0  Chloride 96 - 106 mmol/L 100  CO2 20 - 29 mmol/L 21  Calcium 8.7 - 10.2 mg/dL 9.8  Total Protein 6.0 - 8.5 g/dL 6.8  Total Bilirubin 0.0 - 1.2 mg/dL 0.9  Alkaline Phos 39 - 117 IU/L 112  AST 0 - 40 IU/L 25  ALT 0 - 44 IU/L 30   CBC Latest Ref Rng & Units 02/19/2019  WBC 4.0 - 10.5 K/uL 11.0(H)  Hemoglobin 13.0 - 17.0 g/dL 19.1(H)  Hematocrit 39.0 - 52.0 % 55.2(H)  Platelets 150 - 400 K/uL 302    Assessment and plan- Patient is a 53 y.o. male referred for polycythemia  I suspect his polycythemia is secondary to smoking.  Discussed differences between primary and secondary polycythemia.  I will also like to rule out primary polycythemia vera and check EPO level and Jak 2 mutation testing today.  Check urine analysis to rule out RCC as a cause of polycythemia.  Given that his recent hemoglobin was 20  with a hematocrit of 16 we will proceed with phlebotomy later this week and continue phlebotomy every 2 weeks until hematocrit is less than 50.  I have also counseled him about risks of ongoing smoking and encouraged him to quit.  Patient would like to think about it  I will see him back in 2 months time with a repeat CBC with differential.  He will get CBC checked every 2 weeks while undergoing phlebotomy   Thank you for this kind referral and the opportunity to participate in the care of this patient   Visit Diagnosis 1. Polycythemia     Dr. Randa Evens, MD, MPH [facility] [phone] 02/20/2019 10:23 AM

## 2019-02-21 ENCOUNTER — Other Ambulatory Visit: Payer: Self-pay

## 2019-02-21 ENCOUNTER — Inpatient Hospital Stay: Payer: BC Managed Care – PPO

## 2019-02-21 DIAGNOSIS — D751 Secondary polycythemia: Secondary | ICD-10-CM | POA: Diagnosis not present

## 2019-02-23 LAB — JAK2 GENOTYPR

## 2019-02-26 ENCOUNTER — Inpatient Hospital Stay: Payer: BC Managed Care – PPO

## 2019-02-27 ENCOUNTER — Telehealth: Payer: Self-pay | Admitting: *Deleted

## 2019-02-27 NOTE — Telephone Encounter (Signed)
Called pt to let him know that I have changed his appts and he can see them on my chart. He was concerned aboutt the appt on 9/14 because he has colonoscopy. I had change the appt to wed. And it will not interfere with colonoscopy. He will look them over and call if he has any questions

## 2019-03-05 ENCOUNTER — Inpatient Hospital Stay: Payer: BC Managed Care – PPO

## 2019-03-06 ENCOUNTER — Other Ambulatory Visit: Payer: Self-pay

## 2019-03-07 ENCOUNTER — Inpatient Hospital Stay: Payer: BC Managed Care – PPO | Attending: Oncology

## 2019-03-07 ENCOUNTER — Other Ambulatory Visit: Payer: Self-pay

## 2019-03-07 ENCOUNTER — Inpatient Hospital Stay: Payer: BC Managed Care – PPO

## 2019-03-07 VITALS — BP 128/86 | HR 66 | Resp 18

## 2019-03-07 DIAGNOSIS — D751 Secondary polycythemia: Secondary | ICD-10-CM

## 2019-03-07 LAB — CBC
HCT: 52 % (ref 39.0–52.0)
Hemoglobin: 18.2 g/dL — ABNORMAL HIGH (ref 13.0–17.0)
MCH: 31.3 pg (ref 26.0–34.0)
MCHC: 35 g/dL (ref 30.0–36.0)
MCV: 89.5 fL (ref 80.0–100.0)
Platelets: 293 10*3/uL (ref 150–400)
RBC: 5.81 MIL/uL (ref 4.22–5.81)
RDW: 12.8 % (ref 11.5–15.5)
WBC: 11 10*3/uL — ABNORMAL HIGH (ref 4.0–10.5)
nRBC: 0 % (ref 0.0–0.2)

## 2019-03-12 ENCOUNTER — Inpatient Hospital Stay: Payer: BC Managed Care – PPO

## 2019-03-19 ENCOUNTER — Inpatient Hospital Stay: Payer: BC Managed Care – PPO

## 2019-03-20 ENCOUNTER — Other Ambulatory Visit: Payer: Self-pay

## 2019-03-21 ENCOUNTER — Other Ambulatory Visit: Payer: Self-pay

## 2019-03-21 ENCOUNTER — Inpatient Hospital Stay: Payer: BC Managed Care – PPO

## 2019-03-21 DIAGNOSIS — D751 Secondary polycythemia: Secondary | ICD-10-CM

## 2019-03-21 LAB — CBC
HCT: 50.3 % (ref 39.0–52.0)
Hemoglobin: 17.4 g/dL — ABNORMAL HIGH (ref 13.0–17.0)
MCH: 30.8 pg (ref 26.0–34.0)
MCHC: 34.6 g/dL (ref 30.0–36.0)
MCV: 89 fL (ref 80.0–100.0)
Platelets: 317 10*3/uL (ref 150–400)
RBC: 5.65 MIL/uL (ref 4.22–5.81)
RDW: 13.2 % (ref 11.5–15.5)
WBC: 11.2 10*3/uL — ABNORMAL HIGH (ref 4.0–10.5)
nRBC: 0 % (ref 0.0–0.2)

## 2019-04-02 ENCOUNTER — Inpatient Hospital Stay: Payer: BC Managed Care – PPO

## 2019-04-03 ENCOUNTER — Other Ambulatory Visit: Payer: Self-pay

## 2019-04-04 ENCOUNTER — Other Ambulatory Visit: Payer: Self-pay

## 2019-04-04 ENCOUNTER — Inpatient Hospital Stay: Payer: BC Managed Care – PPO | Attending: Oncology

## 2019-04-04 ENCOUNTER — Inpatient Hospital Stay: Payer: BC Managed Care – PPO

## 2019-04-04 DIAGNOSIS — D751 Secondary polycythemia: Secondary | ICD-10-CM | POA: Diagnosis not present

## 2019-04-04 LAB — CBC
HCT: 46.7 % (ref 39.0–52.0)
Hemoglobin: 16 g/dL (ref 13.0–17.0)
MCH: 31 pg (ref 26.0–34.0)
MCHC: 34.3 g/dL (ref 30.0–36.0)
MCV: 90.5 fL (ref 80.0–100.0)
Platelets: 333 10*3/uL (ref 150–400)
RBC: 5.16 MIL/uL (ref 4.22–5.81)
RDW: 13.2 % (ref 11.5–15.5)
WBC: 10.7 10*3/uL — ABNORMAL HIGH (ref 4.0–10.5)
nRBC: 0 % (ref 0.0–0.2)

## 2019-04-12 ENCOUNTER — Other Ambulatory Visit: Admission: RE | Admit: 2019-04-12 | Payer: BC Managed Care – PPO | Source: Ambulatory Visit

## 2019-04-16 ENCOUNTER — Other Ambulatory Visit: Payer: BC Managed Care – PPO

## 2019-04-18 ENCOUNTER — Inpatient Hospital Stay: Payer: BC Managed Care – PPO

## 2019-04-18 ENCOUNTER — Other Ambulatory Visit: Payer: Self-pay

## 2019-04-18 DIAGNOSIS — D751 Secondary polycythemia: Secondary | ICD-10-CM

## 2019-04-18 LAB — CBC
HCT: 51.5 % (ref 39.0–52.0)
Hemoglobin: 17.2 g/dL — ABNORMAL HIGH (ref 13.0–17.0)
MCH: 31 pg (ref 26.0–34.0)
MCHC: 33.4 g/dL (ref 30.0–36.0)
MCV: 93 fL (ref 80.0–100.0)
Platelets: 342 10*3/uL (ref 150–400)
RBC: 5.54 MIL/uL (ref 4.22–5.81)
RDW: 13.7 % (ref 11.5–15.5)
WBC: 10 10*3/uL (ref 4.0–10.5)
nRBC: 0 % (ref 0.0–0.2)

## 2019-04-23 ENCOUNTER — Inpatient Hospital Stay: Payer: BC Managed Care – PPO

## 2019-04-23 ENCOUNTER — Inpatient Hospital Stay: Payer: BC Managed Care – PPO | Admitting: Oncology

## 2019-04-30 ENCOUNTER — Inpatient Hospital Stay: Payer: BC Managed Care – PPO

## 2019-04-30 ENCOUNTER — Inpatient Hospital Stay: Payer: BC Managed Care – PPO | Admitting: Oncology

## 2019-05-03 ENCOUNTER — Inpatient Hospital Stay: Payer: BC Managed Care – PPO

## 2019-05-03 ENCOUNTER — Inpatient Hospital Stay: Payer: BC Managed Care – PPO | Admitting: Oncology

## 2019-05-03 NOTE — Progress Notes (Signed)
Patient is coming in for follow up he is doing well no complaints  

## 2019-05-04 ENCOUNTER — Telehealth: Payer: Self-pay | Admitting: *Deleted

## 2019-05-04 ENCOUNTER — Inpatient Hospital Stay (HOSPITAL_BASED_OUTPATIENT_CLINIC_OR_DEPARTMENT_OTHER): Payer: BC Managed Care – PPO | Admitting: Oncology

## 2019-05-04 ENCOUNTER — Inpatient Hospital Stay: Payer: BC Managed Care – PPO

## 2019-05-04 ENCOUNTER — Other Ambulatory Visit: Payer: Self-pay

## 2019-05-04 ENCOUNTER — Inpatient Hospital Stay: Payer: BC Managed Care – PPO | Attending: Oncology

## 2019-05-04 VITALS — BP 133/88 | HR 71 | Temp 97.8°F

## 2019-05-04 VITALS — BP 137/88 | HR 71 | Temp 95.8°F | Wt 224.0 lb

## 2019-05-04 DIAGNOSIS — D751 Secondary polycythemia: Secondary | ICD-10-CM

## 2019-05-04 LAB — CBC
HCT: 49.6 % (ref 39.0–52.0)
Hemoglobin: 17 g/dL (ref 13.0–17.0)
MCH: 31.5 pg (ref 26.0–34.0)
MCHC: 34.3 g/dL (ref 30.0–36.0)
MCV: 91.9 fL (ref 80.0–100.0)
Platelets: 318 10*3/uL (ref 150–400)
RBC: 5.4 MIL/uL (ref 4.22–5.81)
RDW: 13.4 % (ref 11.5–15.5)
WBC: 11 10*3/uL — ABNORMAL HIGH (ref 4.0–10.5)
nRBC: 0 % (ref 0.0–0.2)

## 2019-05-04 NOTE — Progress Notes (Signed)
HCT 49.6, per MD treatment conditions hold phlebotomy if HCT less than 50. Clarified orders with Dr. Janese Banks, Per Dr. Janese Banks proceed with 250cc therapeutic phlebotomy as scheduled.

## 2019-05-04 NOTE — Telephone Encounter (Signed)
Called pt. I was unable to get over to infusion in time to give him a schedule. He uses my chart but prefers a paper copy. I told him his next appt 10/16 and he sees that in my chart and I have printed his appts and will put in mail to him.

## 2019-05-06 NOTE — Progress Notes (Signed)
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  Of note patient has had an elevated hemoglobin dating back to 2016 and his yearly hemoglobin trends have been 17.8, 18.8, 19.3 and then most recently 20.1 on 01/24/2019.  Hemoglobin and platelet counts have been normal.  He does have a chronic smoking history and currently smokes 1 pack/day and has been doing so for the last 30 years.  He denies any exogenous testosterone use.  He does report getting a good night sleep and wakes up in the morning feeling refreshed but does note that his partner has noticed that he snores at night.  He does not have any known chronic lung disease.   EPO level was normal. JAK2 mutation testing was negative. UA showed no hematuria   Interval history- feels well and has chronic mid fatigue. He does wake up mutltiple times at night and also reports snoring at night  ECOG PS- 1 Pain scale- 0   Review of systems- Review of Systems  Constitutional: Positive for malaise/fatigue. Negative for chills, fever and weight loss.  HENT: Negative for congestion, ear discharge and nosebleeds.   Eyes: Negative for blurred vision.  Respiratory: Negative for cough, hemoptysis, sputum production, shortness of breath and wheezing.   Cardiovascular: Negative for chest pain, palpitations, orthopnea and claudication.  Gastrointestinal: Negative for abdominal pain, blood in stool,  constipation, diarrhea, heartburn, melena, nausea and vomiting.  Genitourinary: Negative for dysuria, flank pain, frequency, hematuria and urgency.  Musculoskeletal: Negative for back pain, joint pain and myalgias.  Skin: Negative for rash.  Neurological: Negative for dizziness, tingling, focal weakness, seizures, weakness and headaches.  Endo/Heme/Allergies: Does not bruise/bleed easily.  Psychiatric/Behavioral: Negative for depression and suicidal ideas. The patient does not have insomnia.       No Known Allergies   Past Medical History:  Diagnosis Date  . GERD (gastroesophageal reflux disease)   . Panic attacks      Past Surgical History:  Procedure Laterality Date  . INGUINAL HERNIA REPAIR Left 10/28/2015   Procedure: HERNIA REPAIR INGUINAL ADULT;  Surgeon: Hubbard Robinson, MD;  Location: [facility] ORS;  Service: General;  Laterality: Left;  . SKIN SURGERY     Tumor-benign  . TONSILLECTOMY AND ADENOIDECTOMY    . TUMOR REMOVAL    . VENTRAL HERNIA REPAIR  1968   Dr. Mare Ferrari    Social History   Socioeconomic History  . Marital status: Divorced    Spouse name: Not on file  . Number of children: Not on file  . Years of education: Not on file  . Highest education level: Not on file  Occupational History  . Not on file  Social Needs  . Financial resource strain: Not on file  . Food insecurity    Worry: Not on file    Inability: Not on file  . Transportation needs    Medical: Not on file    Non-medical: Not on file  Tobacco Use  . Smoking status: Current Every Day Smoker  Packs/day: 1.00    Years: 30.00    Pack years: 30.00    Types: Cigarettes    Start date: 10/22/1983    Last attempt to quit: 10/08/2015    Years since quitting: 3.5  . Smokeless tobacco: Never Used  Substance and Sexual Activity  . Alcohol use: Yes    Alcohol/week: 8.0 standard drinks    Types: 8 Standard drinks or equivalent per week  . Drug use: No  . Sexual activity: Not on file   Lifestyle  . Physical activity    Days per week: Not on file    Minutes per session: Not on file  . Stress: Not on file  Relationships  . Social Herbalist on phone: Not on file    Gets together: Not on file    Attends religious service: Not on file    Active member of club or organization: Not on file    Attends meetings of clubs or organizations: Not on file    Relationship status: Not on file  . Intimate partner violence    Fear of current or ex partner: Not on file    Emotionally abused: Not on file    Physically abused: Not on file    Forced sexual activity: Not on file  Other Topics Concern  . Not on file  Social History Narrative  . Not on file    Family History  Problem Relation Age of Onset  . Thyroid disease Mother   . Heart disease Father        heart attack  . Diabetes Father   . Lung cancer Maternal Aunt   . Cancer Maternal Aunt        lung cancer  . Heart disease Paternal Uncle   . Cancer Maternal Grandfather        lung cancer  . Prostate cancer Brother 33  . Lupus Brother      Current Outpatient Medications:  .  cetirizine (ZYRTEC) 10 MG tablet, Take 10 mg by mouth daily., Disp: , Rfl:  .  fluticasone (FLONASE) 50 MCG/ACT nasal spray, Place 2 sprays into both nostrils daily., Disp: 16 g, Rfl: 6 .  Garlic [id number] MG CAPS, Take by mouth., Disp: , Rfl:  .  LORazepam (ATIVAN) 0.5 MG tablet, Take 1 tablet (0.5 mg total) by mouth daily as needed for anxiety., Disp: 10 tablet, Rfl: 0 .  Multiple Vitamin (MULTIVITAMIN) capsule, Take 1 capsule by mouth daily., Disp: , Rfl:  .  omeprazole (PRILOSEC) 10 MG capsule, Take 10 mg by mouth daily., Disp: , Rfl:  .  simvastatin (ZOCOR) 10 MG tablet, Take 1 tablet (10 mg total) by mouth at bedtime., Disp: 90 tablet, Rfl: 3  Physical exam:  Vitals:   05/04/19 1313  BP: 137/88  Pulse: 71  Temp: (!) 95.8 F (35.4 C)  TempSrc: Tympanic  Weight: 224 lb (101.6 kg)   Physical Exam Constitutional:       General: He is not in acute distress. HENT:     Head: Normocephalic and atraumatic.  Eyes:     Pupils: Pupils are equal, round, and reactive to light.  Neck:     Musculoskeletal: Normal range of motion.  Cardiovascular:     Rate and Rhythm: Normal rate and regular rhythm.     Heart sounds: Normal heart sounds.  Pulmonary:     Effort: Pulmonary effort is normal.     Breath sounds: Normal breath sounds.  Abdominal:  General: Bowel sounds are normal.     Palpations: Abdomen is soft.  Skin:    General: Skin is warm and dry.  Neurological:     Mental Status: He is alert and oriented to person, place, and time.      CMP Latest Ref Rng & Units 01/24/2019  Glucose 65 - 99 mg/dL 104(H)  BUN 6 - 24 mg/dL 10  Creatinine 0.76 - 1.27 mg/dL 1.07  Sodium 134 - 144 mmol/L 137  Potassium 3.5 - 5.2 mmol/L 5.0  Chloride 96 - 106 mmol/L 100  CO2 20 - 29 mmol/L 21  Calcium 8.7 - 10.2 mg/dL 9.8  Total Protein 6.0 - 8.5 g/dL 6.8  Total Bilirubin 0.0 - 1.2 mg/dL 0.9  Alkaline Phos 39 - 117 IU/L 112  AST 0 - 40 IU/L 25  ALT 0 - 44 IU/L 30   CBC Latest Ref Rng & Units 05/04/2019  WBC 4.0 - 10.5 K/uL 11.0(H)  Hemoglobin 13.0 - 17.0 g/dL 17.0  Hematocrit 39.0 - 52.0 % 49.6  Platelets 150 - 400 K/uL 318     Assessment and plan- Patient is a 53 y.o. 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 Goal hct is <50. Since he is 49.6 today which is very close to 63- he will proceed with phlebotoym today. Continue cbc checks Q2 weeks and hold phlebotoym if hct <50.   Labs in 3 and 6 months. See me in 6 months.   I will also touch base with DR. Berglund to evaluate him for possible OSA given his symptoms    Visit Diagnosis 1. Polycythemia, secondary      Dr. Randa Evens, MD, MPH [facility] at [facility] [id number] 05/06/2019 1:50 PM

## 2019-05-17 ENCOUNTER — Other Ambulatory Visit: Payer: Self-pay

## 2019-05-18 ENCOUNTER — Other Ambulatory Visit: Payer: Self-pay

## 2019-05-18 ENCOUNTER — Inpatient Hospital Stay: Payer: BC Managed Care – PPO

## 2019-05-18 VITALS — BP 134/87 | HR 74

## 2019-05-18 DIAGNOSIS — D751 Secondary polycythemia: Secondary | ICD-10-CM

## 2019-05-18 LAB — CBC WITH DIFFERENTIAL/PLATELET
Abs Immature Granulocytes: 0.03 10*3/uL (ref 0.00–0.07)
Basophils Absolute: 0.1 10*3/uL (ref 0.0–0.1)
Basophils Relative: 1 %
Eosinophils Absolute: 0.2 10*3/uL (ref 0.0–0.5)
Eosinophils Relative: 2 %
HCT: 51.3 % (ref 39.0–52.0)
Hemoglobin: 17.2 g/dL — ABNORMAL HIGH (ref 13.0–17.0)
Immature Granulocytes: 0 %
Lymphocytes Relative: 26 %
Lymphs Abs: 2.6 10*3/uL (ref 0.7–4.0)
MCH: 31.2 pg (ref 26.0–34.0)
MCHC: 33.5 g/dL (ref 30.0–36.0)
MCV: 92.9 fL (ref 80.0–100.0)
Monocytes Absolute: 0.6 10*3/uL (ref 0.1–1.0)
Monocytes Relative: 6 %
Neutro Abs: 6.7 10*3/uL (ref 1.7–7.7)
Neutrophils Relative %: 65 %
Platelets: 361 10*3/uL (ref 150–400)
RBC: 5.52 MIL/uL (ref 4.22–5.81)
RDW: 13.1 % (ref 11.5–15.5)
WBC: 10.2 10*3/uL (ref 4.0–10.5)
nRBC: 0 % (ref 0.0–0.2)

## 2019-06-01 ENCOUNTER — Inpatient Hospital Stay: Payer: BC Managed Care – PPO

## 2019-06-01 ENCOUNTER — Other Ambulatory Visit: Payer: Self-pay

## 2019-06-01 DIAGNOSIS — D751 Secondary polycythemia: Secondary | ICD-10-CM | POA: Diagnosis not present

## 2019-06-01 LAB — CBC
HCT: 48.3 % (ref 39.0–52.0)
Hemoglobin: 16.4 g/dL (ref 13.0–17.0)
MCH: 31 pg (ref 26.0–34.0)
MCHC: 34 g/dL (ref 30.0–36.0)
MCV: 91.3 fL (ref 80.0–100.0)
Platelets: 355 10*3/uL (ref 150–400)
RBC: 5.29 MIL/uL (ref 4.22–5.81)
RDW: 12.6 % (ref 11.5–15.5)
WBC: 9.6 10*3/uL (ref 4.0–10.5)
nRBC: 0 % (ref 0.0–0.2)

## 2019-06-07 ENCOUNTER — Encounter: Payer: Self-pay | Admitting: *Deleted

## 2019-06-07 ENCOUNTER — Other Ambulatory Visit: Payer: Self-pay

## 2019-06-14 ENCOUNTER — Other Ambulatory Visit: Payer: Self-pay

## 2019-06-14 ENCOUNTER — Other Ambulatory Visit
Admission: RE | Admit: 2019-06-14 | Discharge: 2019-06-14 | Disposition: A | Discharge status: Home / Self Care | Payer: BC Managed Care – PPO | Source: Ambulatory Visit | Attending: Gastroenterology | Admitting: Gastroenterology

## 2019-06-14 DIAGNOSIS — Z20828 Contact with and (suspected) exposure to other viral communicable diseases: Secondary | ICD-10-CM | POA: Insufficient documentation

## 2019-06-14 DIAGNOSIS — Z01812 Encounter for preprocedural laboratory examination: Secondary | ICD-10-CM | POA: Insufficient documentation

## 2019-06-14 LAB — SARS CORONAVIRUS 2 (TAT 6-24 HRS): SARS Coronavirus 2: NEGATIVE

## 2019-06-14 NOTE — Anesthesia Preprocedure Evaluation (Addendum)
Anesthesia Evaluation  Patient identified by MRN, date of birth, ID band Patient awake    Reviewed: Allergy & Precautions, NPO status , Patient's Chart, lab work & pertinent test results  History of Anesthesia Complications Negative for: history of anesthetic complications  Airway Mallampati: III   Neck ROM: Full    Dental  (+)    Pulmonary Current Smoker (1.5 ppd)Patient did not abstain from smoking.,    Pulmonary exam normal breath sounds clear to auscultation       Cardiovascular Exercise Tolerance: Good negative cardio ROS Normal cardiovascular exam Rhythm:Regular Rate:Normal     Neuro/Psych PSYCHIATRIC DISORDERS (panic disorder) Anxiety negative neurological ROS     GI/Hepatic GERD  ,  Endo/Other  negative endocrine ROS  Renal/GU negative Renal ROS     Musculoskeletal   Abdominal   Peds  Hematology  (+) Blood dyscrasia (polycythemia), ,   Anesthesia Other Findings   Reproductive/Obstetrics                            Anesthesia Physical Anesthesia Plan  ASA: II  Anesthesia Plan: General   Post-op Pain Management:    Induction: Intravenous  PONV Risk Score and Plan: 1 and Propofol infusion, TIVA and Treatment may vary due to age or medical condition  Airway Management Planned: Natural Airway  Additional Equipment:   Intra-op Plan:   Post-operative Plan:   Informed Consent: I have reviewed the patients History and Physical, chart, labs and discussed the procedure including the risks, benefits and alternatives for the proposed anesthesia with the patient or authorized representative who has indicated his/her understanding and acceptance.       Plan Discussed with: CRNA  Anesthesia Plan Comments:        Anesthesia Quick Evaluation

## 2019-06-15 ENCOUNTER — Inpatient Hospital Stay: Payer: BC Managed Care – PPO | Attending: Oncology

## 2019-06-15 ENCOUNTER — Other Ambulatory Visit: Payer: Self-pay

## 2019-06-15 ENCOUNTER — Inpatient Hospital Stay: Payer: BC Managed Care – PPO

## 2019-06-15 VITALS — BP 133/90 | HR 73

## 2019-06-15 DIAGNOSIS — D751 Secondary polycythemia: Secondary | ICD-10-CM | POA: Diagnosis present

## 2019-06-15 LAB — CBC WITH DIFFERENTIAL/PLATELET
Abs Immature Granulocytes: 0.02 10*3/uL (ref 0.00–0.07)
Basophils Absolute: 0.1 10*3/uL (ref 0.0–0.1)
Basophils Relative: 1 %
Eosinophils Absolute: 0.4 10*3/uL (ref 0.0–0.5)
Eosinophils Relative: 4 %
HCT: 51.6 % (ref 39.0–52.0)
Hemoglobin: 17.4 g/dL — ABNORMAL HIGH (ref 13.0–17.0)
Immature Granulocytes: 0 %
Lymphocytes Relative: 31 %
Lymphs Abs: 3.2 10*3/uL (ref 0.7–4.0)
MCH: 30.7 pg (ref 26.0–34.0)
MCHC: 33.7 g/dL (ref 30.0–36.0)
MCV: 91.2 fL (ref 80.0–100.0)
Monocytes Absolute: 0.8 10*3/uL (ref 0.1–1.0)
Monocytes Relative: 8 %
Neutro Abs: 5.9 10*3/uL (ref 1.7–7.7)
Neutrophils Relative %: 56 %
Platelets: 328 10*3/uL (ref 150–400)
RBC: 5.66 MIL/uL (ref 4.22–5.81)
RDW: 12.6 % (ref 11.5–15.5)
WBC: 10.3 10*3/uL (ref 4.0–10.5)
nRBC: 0 % (ref 0.0–0.2)

## 2019-06-15 NOTE — Discharge Instructions (Signed)

## 2019-06-18 ENCOUNTER — Ambulatory Visit: Payer: BC Managed Care – PPO | Admitting: Anesthesiology

## 2019-06-18 ENCOUNTER — Ambulatory Visit
Admission: RE | Admit: 2019-06-18 | Discharge: 2019-06-18 | Disposition: A | Discharge status: Home / Self Care | Payer: BC Managed Care – PPO | Attending: Gastroenterology | Admitting: Gastroenterology

## 2019-06-18 ENCOUNTER — Encounter
Admission: RE | Disposition: A | Discharge status: Home / Self Care | Payer: Self-pay | Source: Home / Self Care | Attending: Gastroenterology

## 2019-06-18 ENCOUNTER — Other Ambulatory Visit: Payer: Self-pay

## 2019-06-18 DIAGNOSIS — F17210 Nicotine dependence, cigarettes, uncomplicated: Secondary | ICD-10-CM | POA: Insufficient documentation

## 2019-06-18 DIAGNOSIS — Z79899 Other long term (current) drug therapy: Secondary | ICD-10-CM | POA: Diagnosis not present

## 2019-06-18 DIAGNOSIS — K635 Polyp of colon: Secondary | ICD-10-CM

## 2019-06-18 DIAGNOSIS — K5730 Diverticulosis of large intestine without perforation or abscess without bleeding: Secondary | ICD-10-CM | POA: Insufficient documentation

## 2019-06-18 DIAGNOSIS — Z7982 Long term (current) use of aspirin: Secondary | ICD-10-CM | POA: Insufficient documentation

## 2019-06-18 DIAGNOSIS — D125 Benign neoplasm of sigmoid colon: Secondary | ICD-10-CM | POA: Insufficient documentation

## 2019-06-18 DIAGNOSIS — Z1211 Encounter for screening for malignant neoplasm of colon: Secondary | ICD-10-CM | POA: Diagnosis present

## 2019-06-18 DIAGNOSIS — K219 Gastro-esophageal reflux disease without esophagitis: Secondary | ICD-10-CM | POA: Diagnosis not present

## 2019-06-18 HISTORY — PX: COLONOSCOPY WITH PROPOFOL: SHX5780

## 2019-06-18 HISTORY — DX: Secondary polycythemia: D75.1

## 2019-06-18 HISTORY — PX: POLYPECTOMY: SHX5525

## 2019-06-18 SURGERY — COLONOSCOPY WITH PROPOFOL
Anesthesia: General | Site: Rectum

## 2019-06-18 MED ORDER — STERILE WATER FOR IRRIGATION IR SOLN
Status: DC | PRN
  Administered 2019-06-18: 50 mL

## 2019-06-18 MED ORDER — PROPOFOL 10 MG/ML IV BOLUS
INTRAVENOUS | Status: DC | PRN
  Administered 2019-06-18 (×3): 50 mg via INTRAVENOUS
  Administered 2019-06-18: 70 mg via INTRAVENOUS

## 2019-06-18 MED ORDER — LIDOCAINE HCL (CARDIAC) PF 100 MG/5ML IV SOSY
PREFILLED_SYRINGE | INTRAVENOUS | Status: DC | PRN
  Administered 2019-06-18: 60 mg via INTRAVENOUS

## 2019-06-18 MED ORDER — LACTATED RINGERS IV SOLN
10.0000 mL/h | INTRAVENOUS | Status: DC
  Administered 2019-06-18: 08:00:00 10 mL/h via INTRAVENOUS

## 2019-06-18 SURGICAL SUPPLY — 9 items
CANISTER SUCT 1200ML W/VALVE (MISCELLANEOUS) ×4 IMPLANT
ELECT REM PT RETURN 9FT ADLT (ELECTROSURGICAL) ×4
ELECTRODE REM PT RTRN 9FT ADLT (ELECTROSURGICAL) ×2 IMPLANT
GOWN CVR UNV OPN BCK APRN NK (MISCELLANEOUS) ×4 IMPLANT
GOWN ISOL THUMB LOOP REG UNIV (MISCELLANEOUS) ×4
KIT ENDO PROCEDURE OLY (KITS) ×4 IMPLANT
SNARE SHORT THROW 13M SML OVAL (MISCELLANEOUS) ×4 IMPLANT
TRAP ETRAP POLY (MISCELLANEOUS) ×4 IMPLANT
WATER STERILE IRR 250ML POUR (IV SOLUTION) ×4 IMPLANT

## 2019-06-18 NOTE — Op Note (Signed)
[facility] Gastroenterology Patient Name: Harold Carroll Procedure Date: 06/18/2019 8:47 AM MRN: [id number] Account #: [id number] Date of Birth: September 16, 1965 Admit Type: Outpatient Age: 53 Room: [facility] OR ROOM 01 Gender: Male Note Status: Finalized Procedure:             Colonoscopy Indications:           Screening for colorectal malignant neoplasm Providers:             Lucilla Lame MD, MD Referring MD:          Halina Maidens, MD (Referring MD) Medicines:             Propofol per Anesthesia Complications:         No immediate complications. Procedure:             Pre-Anesthesia Assessment:                        - Prior to the procedure, a History and Physical was                         performed, and patient medications and allergies were                         reviewed. The patient's tolerance of previous                         anesthesia was also reviewed. The risks and benefits                         of the procedure and the sedation options and risks                         were discussed with the patient. All questions were                         answered, and informed consent was obtained. Prior                         Anticoagulants: The patient has taken no previous                         anticoagulant or antiplatelet agents. ASA Grade                         Assessment: II - A patient with mild systemic disease.                         After reviewing the risks and benefits, the patient                         was deemed in satisfactory condition to undergo the                         procedure.                        After obtaining informed consent, the colonoscope was  passed under direct vision. Throughout the procedure,                         the patient's blood pressure, pulse, and oxygen                         saturations were monitored continuously. The was                         introduced through the anus and  advanced to the the                         cecum, identified by appendiceal orifice and ileocecal                         valve. The colonoscopy was performed without                         difficulty. The patient tolerated the procedure well.                         The quality of the bowel preparation was excellent. Findings:      The perianal and digital rectal examinations were normal.      Three sessile polyps were found in the sigmoid colon. The polyps were 3       to 5 mm in size. These polyps were removed with a cold snare. Resection       and retrieval were complete.      A 6 mm polyp was found in the sigmoid colon. The polyp was sessile. The       polyp was removed with a hot snare. Resection and retrieval were       complete.      Multiple small-mouthed diverticula were found in the sigmoid colon and       descending colon. Impression:            - Three 3 to 5 mm polyps in the sigmoid colon, removed                         with a cold snare. Resected and retrieved.                        - One 6 mm polyp in the sigmoid colon, removed with a                         hot snare. Resected and retrieved.                        - Diverticulosis in the sigmoid colon and in the                         descending colon. Recommendation:        - Discharge patient to home.                        - Resume previous diet.                        - Continue present medications.                        -  Await pathology results.                        - Repeat colonoscopy in 5 years if polyp adenoma and                         10 years if hyperplastic Procedure Code(s):     --- Professional ---                        [phone], Colonoscopy, flexible; with removal of                         tumor(s), polyp(s), or other lesion(s) by snare                         technique Diagnosis Code(s):     --- Professional ---                        Z12.11, Encounter for screening for malignant neoplasm                          of colon                        K63.5, Polyp of colon CPT copyright 2019 American Medical Association. All rights reserved. The codes documented in this report are preliminary and upon coder review may  be revised to meet current compliance requirements. Lucilla Lame MD, MD 06/18/2019 9:21:53 AM This report has been signed electronically. Number of Addenda: 0 Note Initiated On: 06/18/2019 8:47 AM Scope Withdrawal Time: 0 hours 6 minutes 46 seconds  Total Procedure Duration: 0 hours 13 minutes 12 seconds  Estimated Blood Loss:  Estimated blood loss: none.      [facility]

## 2019-06-18 NOTE — Anesthesia Postprocedure Evaluation (Signed)
Anesthesia Post Note  Patient: Issak TREYGAN VIEW  Procedure(s) Performed: COLONOSCOPY WITH BIOPSY (N/A Rectum) POLYPECTOMY (Rectum)     Patient location during evaluation: PACU Anesthesia Type: General Level of consciousness: awake and alert, oriented and patient cooperative Pain management: pain level controlled Vital Signs Assessment: post-procedure vital signs reviewed and stable Respiratory status: spontaneous breathing, nonlabored ventilation and respiratory function stable Cardiovascular status: blood pressure returned to baseline and stable Postop Assessment: adequate PO intake Anesthetic complications: no    Darrin Nipper

## 2019-06-18 NOTE — H&P (Signed)
Harold Lame, MD [facility] [address] [place], [place] [address] Phone: [phone] Fax : [phone]  Primary Care Physician:  Glean Hess, MD Primary Gastroenterologist:  Dr. Allen Norris  Pre-Procedure History & Physical: HPI:  Harold Carroll is a 53 y.o. male is here for a screening colonoscopy.   Past Medical History:  Diagnosis Date  . GERD (gastroesophageal reflux disease)   . Panic attacks   . Polycythemia     Past Surgical History:  Procedure Laterality Date  . INGUINAL HERNIA REPAIR Left 10/28/2015   Procedure: HERNIA REPAIR INGUINAL ADULT;  Surgeon: Hubbard Robinson, MD;  Location: [facility] ORS;  Service: General;  Laterality: Left;  . SKIN SURGERY     Tumor-benign  . TONSILLECTOMY AND ADENOIDECTOMY    . TUMOR REMOVAL    . VENTRAL HERNIA REPAIR  1968   Dr. Mare Ferrari    Prior to Admission medications   Medication Sig Start Date End Date Taking? Authorizing Provider  ASPIRIN 81 PO Take by mouth daily.   Yes [provider]  cetirizine (ZYRTEC) 10 MG tablet Take 10 mg by mouth daily.   Yes [provider]  fluticasone (FLONASE) 50 MCG/ACT nasal spray Place 2 sprays into both nostrils daily. 04/22/17  Yes Glean Hess, MD  Multiple Vitamin (MULTIVITAMIN) capsule Take 1 capsule by mouth daily.   Yes [provider]  omeprazole (PRILOSEC) 10 MG capsule Take 10 mg by mouth daily.   Yes [provider]  simvastatin (ZOCOR) 10 MG tablet Take 1 tablet (10 mg total) by mouth at bedtime. 01/25/19  Yes Glean Hess, MD  LORazepam (ATIVAN) 0.5 MG tablet Take 1 tablet (0.5 mg total) by mouth daily as needed for anxiety. Patient not taking: Reported on 06/07/2019 11/21/17   Glean Hess, MD    Allergies as of 02/19/2019  . (No Known Allergies)    Family History  Problem Relation Age of Onset  . Thyroid disease Mother   . Heart disease Father        heart attack  . Diabetes Father   . Lung cancer Maternal Aunt    . Cancer Maternal Aunt        lung cancer  . Heart disease Paternal Uncle   . Cancer Maternal Grandfather        lung cancer  . Prostate cancer Brother 57  . Lupus Brother     Social History   Socioeconomic History  . Marital status: Divorced    Spouse name: Not on file  . Number of children: Not on file  . Years of education: Not on file  . Highest education level: Not on file  Occupational History  . Not on file  Social Needs  . Financial resource strain: Not on file  . Food insecurity    Worry: Not on file    Inability: Not on file  . Transportation needs    Medical: Not on file    Non-medical: Not on file  Tobacco Use  . Smoking status: Current Every Day Smoker    Packs/day: 1.50    Years: 30.00    Pack years: 45.00    Types: Cigarettes    Start date: 10/22/1983    Last attempt to quit: 10/08/2015    Years since quitting: 3.6  . Smokeless tobacco: Never Used  . Tobacco comment: off and on since 1985  Substance and Sexual Activity  . Alcohol use: Yes    Alcohol/week: 8.0 standard drinks  Types: 8 Standard drinks or equivalent per week  . Drug use: No  . Sexual activity: Not on file  Lifestyle  . Physical activity    Days per week: Not on file    Minutes per session: Not on file  . Stress: Not on file  Relationships  . Social Herbalist on phone: Not on file    Gets together: Not on file    Attends religious service: Not on file    Active member of club or organization: Not on file    Attends meetings of clubs or organizations: Not on file    Relationship status: Not on file  . Intimate partner violence    Fear of current or ex partner: Not on file    Emotionally abused: Not on file    Physically abused: Not on file    Forced sexual activity: Not on file  Other Topics Concern  . Not on file  Social History Narrative  . Not on file    Review of Systems: See HPI, otherwise negative ROS  Physical Exam: BP (!) 146/94   Pulse 78   Temp  97.9 F (36.6 C) (Temporal)   Ht 6' (1.829 m)   Wt 101.2 kg   SpO2 99%   BMI 30.24 kg/m  General:   Alert,  pleasant and cooperative in NAD Head:  Normocephalic and atraumatic. Neck:  Supple; no masses or thyromegaly. Lungs:  Clear throughout to auscultation.    Heart:  Regular rate and rhythm. Abdomen:  Soft, nontender and nondistended. Normal bowel sounds, without guarding, and without rebound.   Neurologic:  Alert and  oriented x4;  grossly normal neurologically.  Impression/Plan: Harold Carroll is now here to undergo a screening colonoscopy.  Risks, benefits, and alternatives regarding colonoscopy have been reviewed with the patient.  Questions have been answered.  All parties agreeable.

## 2019-06-18 NOTE — Transfer of Care (Signed)
Immediate Anesthesia Transfer of Care Note  Patient: Harold Carroll  Procedure(s) Performed: COLONOSCOPY WITH BIOPSY (N/A Rectum) POLYPECTOMY (Rectum)  Patient Location: PACU  Anesthesia Type: General  Level of Consciousness: awake, alert  and patient cooperative  Airway and Oxygen Therapy: Patient Spontanous Breathing and Patient connected to supplemental oxygen  Post-op Assessment: Post-op Vital signs reviewed, Patient's Cardiovascular Status Stable, Respiratory Function Stable, Patent Airway and No signs of Nausea or vomiting  Post-op Vital Signs: Reviewed and stable  Complications: No apparent anesthesia complications

## 2019-06-19 ENCOUNTER — Encounter: Payer: Self-pay | Admitting: Gastroenterology

## 2019-06-20 ENCOUNTER — Encounter: Payer: Self-pay | Admitting: Gastroenterology

## 2019-06-29 ENCOUNTER — Inpatient Hospital Stay: Payer: BC Managed Care – PPO

## 2019-07-12 ENCOUNTER — Encounter: Payer: Self-pay | Admitting: Internal Medicine

## 2019-07-12 ENCOUNTER — Other Ambulatory Visit: Payer: Self-pay

## 2019-07-12 ENCOUNTER — Ambulatory Visit: Payer: BC Managed Care – PPO | Admitting: Internal Medicine

## 2019-07-12 VITALS — BP 128/78 | HR 79 | Ht 72.0 in | Wt 226.0 lb

## 2019-07-12 DIAGNOSIS — D751 Secondary polycythemia: Secondary | ICD-10-CM | POA: Diagnosis not present

## 2019-07-12 DIAGNOSIS — G4730 Sleep apnea, unspecified: Secondary | ICD-10-CM | POA: Diagnosis not present

## 2019-07-12 DIAGNOSIS — F410 Panic disorder [episodic paroxysmal anxiety] without agoraphobia: Secondary | ICD-10-CM | POA: Diagnosis not present

## 2019-07-12 MED ORDER — LORAZEPAM 0.5 MG PO TABS: 0.5000 mg | ORAL_TABLET | Freq: Every day | ORAL | 0 refills | Status: DC | PRN

## 2019-07-12 NOTE — Progress Notes (Signed)
Date:  07/12/2019   Name:  Harold Carroll   DOB:  [birth year]   MRN:  [id number]   Chief Complaint: Anxiety (Needs RF on Ativan. )  Anxiety Presents for follow-up visit. Symptoms include panic. Patient reports no chest pain, dizziness, feeling of choking, insomnia, nervous/anxious behavior, palpitations or shortness of breath. Symptoms occur rarely.    Erythrocytosis - he has had a full workup and does not have PCV.  He also noted that during the year that he did smoke his count was still high. His girlfriend says that he snores and she has witnessed apnea. Lab Results  Component Value Date   CREATININE 1.07 01/24/2019   BUN 10 01/24/2019   NA 137 01/24/2019   K 5.0 01/24/2019   CL 100 01/24/2019   CO2 21 01/24/2019   Lab Results  Component Value Date   CHOL 236 (H) 01/24/2019   HDL 40 01/24/2019   LDLCALC 159 (H) 01/24/2019   TRIG 186 (H) 01/24/2019   CHOLHDL 5.9 (H) 01/24/2019   Lab Results  Component Value Date   TSH 1.240 09/29/2015   Lab Results  Component Value Date   HGBA1C 5.6 09/29/2015     Review of Systems  Constitutional: Negative for chills, fatigue and unexpected weight change.  Eyes: Negative for visual disturbance.  Respiratory: Negative for chest tightness and shortness of breath.   Cardiovascular: Negative for chest pain and palpitations.  Gastrointestinal: Negative for blood in stool, constipation and diarrhea.  Genitourinary: Negative for hematuria.  Neurological: Negative for dizziness, light-headedness and headaches.  Psychiatric/Behavioral: Positive for sleep disturbance (snoring). The patient is not nervous/anxious and does not have insomnia.     Patient Active Problem List   Diagnosis Date Noted  . Special screening for malignant neoplasms, colon   . Polyp of sigmoid colon   . Polycythemia 02/19/2019  . Erythrocytosis 01/25/2019  . Mixed hyperlipidemia 10/02/2015  . Overweight (BMI 25.0-29.9) 09/24/2015  . Tobacco use  disorder 09/24/2015  . Panic disorder 09/24/2015  . FH: heart disease 09/24/2015  . FH: diabetes mellitus 09/24/2015  . GERD (gastroesophageal reflux disease) 09/24/2015  . Seasonal allergic rhinitis 09/24/2015    No Known Allergies  Past Surgical History:  Procedure Laterality Date  . COLONOSCOPY WITH PROPOFOL N/A 06/18/2019   Procedure: COLONOSCOPY WITH BIOPSY;  Surgeon: Lucilla Lame, MD;  Location: [place];  Service: Endoscopy;  Laterality: N/A;  . INGUINAL HERNIA REPAIR Left 10/28/2015   Procedure: HERNIA REPAIR INGUINAL ADULT;  Surgeon: Hubbard Robinson, MD;  Location: [facility] ORS;  Service: General;  Laterality: Left;  . POLYPECTOMY  06/18/2019   Procedure: POLYPECTOMY;  Surgeon: Lucilla Lame, MD;  Location: [place];  Service: Endoscopy;;  . SKIN SURGERY     Tumor-benign  . TONSILLECTOMY AND ADENOIDECTOMY    . TUMOR REMOVAL    . VENTRAL HERNIA REPAIR  1968   Dr. Mare Ferrari    Social History   Tobacco Use  . Smoking status: Current Every Day Smoker    Packs/day: 1.50    Years: 30.00    Pack years: 45.00    Types: Cigarettes    Start date: 10/22/1983    Last attempt to quit: 10/08/2015    Years since quitting: 3.7  . Smokeless tobacco: Never Used  . Tobacco comment: off and on since 1985  Substance Use Topics  . Alcohol use: Yes    Alcohol/week: 8.0 standard drinks    Types: 8 Standard drinks or equivalent per week  .  Drug use: No     Medication list has been reviewed and updated.  Current Meds  Medication Sig  . ASPIRIN 81 PO Take by mouth daily.  . cetirizine (ZYRTEC) 10 MG tablet Take 10 mg by mouth daily.  . fluticasone (FLONASE) 50 MCG/ACT nasal spray Place 2 sprays into both nostrils daily.  Marland Kitchen LORazepam (ATIVAN) 0.5 MG tablet Take 1 tablet (0.5 mg total) by mouth daily as needed for anxiety.  . Multiple Vitamin (MULTIVITAMIN) capsule Take 1 capsule by mouth daily.  Marland Kitchen omeprazole (PRILOSEC) 10 MG capsule Take 10 mg by mouth daily.  .  simvastatin (ZOCOR) 10 MG tablet Take 1 tablet (10 mg total) by mouth at bedtime.    PHQ 2/9 Scores 07/12/2019 01/24/2019 04/22/2017 09/24/2015  PHQ - 2 Score 0 0 0 0    BP Readings from Last 3 Encounters:  07/12/19 128/78  06/18/19 115/86  06/15/19 133/90    Physical Exam Vitals and nursing note reviewed.  Constitutional:      General: He is not in acute distress.    Appearance: Normal appearance. He is well-developed.  HENT:     Head: Normocephalic and atraumatic.  Cardiovascular:     Rate and Rhythm: Normal rate and regular rhythm.     Heart sounds: No murmur.  Pulmonary:     Effort: Pulmonary effort is normal. No respiratory distress.     Breath sounds: No wheezing or rhonchi.  Musculoskeletal:     Cervical back: Normal range of motion.     Right lower leg: No edema.     Left lower leg: No edema.  Skin:    General: Skin is warm and dry.     Capillary Refill: Capillary refill takes less than 2 seconds.     Findings: No rash.  Neurological:     General: No focal deficit present.     Mental Status: He is alert and oriented to person, place, and time.  Psychiatric:        Behavior: Behavior normal.        Thought Content: Thought content normal.     Wt Readings from Last 3 Encounters:  07/12/19 226 lb (102.5 kg)  06/18/19 223 lb (101.2 kg)  05/04/19 224 lb (101.6 kg)    BP 128/78   Pulse 79   Ht 6' (1.829 m)   Wt 226 lb (102.5 kg)   SpO2 93%   BMI 30.65 kg/m   Assessment and Plan: 1. Panic disorder Continue lorazepam PRN - refilled today - LORazepam (ATIVAN) 0.5 MG tablet; Take 1 tablet (0.5 mg total) by mouth daily as needed for anxiety.  Dispense: 10 tablet; Refill: 0  2. Polycythemia Workup has been negative so far other than smoking Will get sleep study to rule out OSA - Nocturnal polysomnography (NPSG); Future  3. Sleep disorder breathing Snoring and witnessed apnea suggests sleep disorder - Nocturnal polysomnography (NPSG); Future   Partially  dictated using Editor, commissioning. Any errors are unintentional.  Halina Maidens, MD [facility]  07/12/2019

## 2019-07-13 ENCOUNTER — Other Ambulatory Visit: Payer: Self-pay

## 2019-07-13 ENCOUNTER — Inpatient Hospital Stay: Payer: BC Managed Care – PPO | Attending: Oncology

## 2019-07-13 ENCOUNTER — Inpatient Hospital Stay: Payer: BC Managed Care – PPO

## 2019-07-13 VITALS — BP 135/90 | HR 72 | Resp 20

## 2019-07-13 DIAGNOSIS — D751 Secondary polycythemia: Secondary | ICD-10-CM | POA: Insufficient documentation

## 2019-07-13 LAB — CBC
HCT: 52.1 % — ABNORMAL HIGH (ref 39.0–52.0)
Hemoglobin: 17.5 g/dL — ABNORMAL HIGH (ref 13.0–17.0)
MCH: 30.2 pg (ref 26.0–34.0)
MCHC: 33.6 g/dL (ref 30.0–36.0)
MCV: 90 fL (ref 80.0–100.0)
Platelets: 321 10*3/uL (ref 150–400)
RBC: 5.79 MIL/uL (ref 4.22–5.81)
RDW: 12.9 % (ref 11.5–15.5)
WBC: 10.4 10*3/uL (ref 4.0–10.5)
nRBC: 0 % (ref 0.0–0.2)

## 2019-08-06 ENCOUNTER — Other Ambulatory Visit: Payer: BC Managed Care – PPO

## 2019-08-10 ENCOUNTER — Other Ambulatory Visit: Payer: Self-pay

## 2019-08-10 ENCOUNTER — Inpatient Hospital Stay: Payer: BC Managed Care – PPO | Attending: Oncology

## 2019-08-10 ENCOUNTER — Inpatient Hospital Stay: Payer: BC Managed Care – PPO

## 2019-08-10 VITALS — BP 134/90 | HR 73 | Temp 97.0°F | Resp 16

## 2019-08-10 DIAGNOSIS — D751 Secondary polycythemia: Secondary | ICD-10-CM | POA: Diagnosis not present

## 2019-08-10 LAB — CBC
HCT: 53.7 % — ABNORMAL HIGH (ref 39.0–52.0)
Hemoglobin: 17.5 g/dL — ABNORMAL HIGH (ref 13.0–17.0)
MCH: 29.6 pg (ref 26.0–34.0)
MCHC: 32.6 g/dL (ref 30.0–36.0)
MCV: 90.9 fL (ref 80.0–100.0)
Platelets: 336 10*3/uL (ref 150–400)
RBC: 5.91 MIL/uL — ABNORMAL HIGH (ref 4.22–5.81)
RDW: 13.3 % (ref 11.5–15.5)
WBC: 10.4 10*3/uL (ref 4.0–10.5)
nRBC: 0 % (ref 0.0–0.2)

## 2019-08-24 ENCOUNTER — Inpatient Hospital Stay: Payer: BC Managed Care – PPO

## 2019-08-24 ENCOUNTER — Other Ambulatory Visit: Payer: Self-pay

## 2019-08-24 DIAGNOSIS — D751 Secondary polycythemia: Secondary | ICD-10-CM

## 2019-08-24 LAB — CBC
HCT: 49.9 % (ref 39.0–52.0)
Hemoglobin: 16.4 g/dL (ref 13.0–17.0)
MCH: 29.7 pg (ref 26.0–34.0)
MCHC: 32.9 g/dL (ref 30.0–36.0)
MCV: 90.4 fL (ref 80.0–100.0)
Platelets: 343 10*3/uL (ref 150–400)
RBC: 5.52 MIL/uL (ref 4.22–5.81)
RDW: 13.4 % (ref 11.5–15.5)
WBC: 11.3 10*3/uL — ABNORMAL HIGH (ref 4.0–10.5)
nRBC: 0 % (ref 0.0–0.2)

## 2019-09-06 ENCOUNTER — Other Ambulatory Visit: Payer: Self-pay

## 2019-09-07 ENCOUNTER — Inpatient Hospital Stay: Payer: BC Managed Care – PPO | Attending: Oncology

## 2019-09-07 ENCOUNTER — Inpatient Hospital Stay: Payer: BC Managed Care – PPO

## 2019-09-07 ENCOUNTER — Other Ambulatory Visit: Payer: Self-pay

## 2019-09-07 VITALS — BP 143/90 | HR 71 | Resp 18

## 2019-09-07 DIAGNOSIS — D751 Secondary polycythemia: Secondary | ICD-10-CM

## 2019-09-07 LAB — CBC
HCT: 51.3 % (ref 39.0–52.0)
Hemoglobin: 16.8 g/dL (ref 13.0–17.0)
MCH: 29.6 pg (ref 26.0–34.0)
MCHC: 32.7 g/dL (ref 30.0–36.0)
MCV: 90.3 fL (ref 80.0–100.0)
Platelets: 353 10*3/uL (ref 150–400)
RBC: 5.68 MIL/uL (ref 4.22–5.81)
RDW: 13.8 % (ref 11.5–15.5)
WBC: 9.9 10*3/uL (ref 4.0–10.5)
nRBC: 0 % (ref 0.0–0.2)

## 2019-09-21 ENCOUNTER — Other Ambulatory Visit: Payer: Self-pay

## 2019-09-21 ENCOUNTER — Telehealth: Payer: Self-pay | Admitting: Oncology

## 2019-09-21 ENCOUNTER — Inpatient Hospital Stay: Payer: BC Managed Care – PPO

## 2019-09-21 DIAGNOSIS — D751 Secondary polycythemia: Secondary | ICD-10-CM

## 2019-09-21 LAB — CBC
HCT: 48.9 % (ref 39.0–52.0)
Hemoglobin: 16.2 g/dL (ref 13.0–17.0)
MCH: 29.5 pg (ref 26.0–34.0)
MCHC: 33.1 g/dL (ref 30.0–36.0)
MCV: 88.9 fL (ref 80.0–100.0)
Platelets: 356 10*3/uL (ref 150–400)
RBC: 5.5 MIL/uL (ref 4.22–5.81)
RDW: 13.5 % (ref 11.5–15.5)
WBC: 9.9 10*3/uL (ref 4.0–10.5)
nRBC: 0 % (ref 0.0–0.2)

## 2019-09-21 NOTE — Progress Notes (Signed)
Labs reviewed with MD, per MD pt does not need phlebotomy today. Pt updated and all questions answered at this time.   Jerremy Maione [employer]

## 2019-09-21 NOTE — Telephone Encounter (Signed)
MD requested that writer cancel all phlebotomies until patient sees MD again in April 2021. Appts have been cancelled until 11-16-19. Writer phoned patient and left voice mail informing of this.

## 2019-10-05 ENCOUNTER — Inpatient Hospital Stay: Payer: BC Managed Care – PPO

## 2019-10-19 ENCOUNTER — Inpatient Hospital Stay: Payer: BC Managed Care – PPO

## 2019-11-02 ENCOUNTER — Inpatient Hospital Stay: Payer: BC Managed Care – PPO

## 2019-11-05 ENCOUNTER — Other Ambulatory Visit: Payer: BC Managed Care – PPO

## 2019-11-05 ENCOUNTER — Ambulatory Visit: Payer: BC Managed Care – PPO | Admitting: Oncology

## 2019-11-16 ENCOUNTER — Inpatient Hospital Stay: Payer: BC Managed Care – PPO

## 2019-11-16 ENCOUNTER — Other Ambulatory Visit: Payer: Self-pay

## 2019-11-16 ENCOUNTER — Encounter: Payer: Self-pay | Admitting: Oncology

## 2019-11-16 ENCOUNTER — Inpatient Hospital Stay: Payer: BC Managed Care – PPO | Attending: Oncology

## 2019-11-16 ENCOUNTER — Inpatient Hospital Stay (HOSPITAL_BASED_OUTPATIENT_CLINIC_OR_DEPARTMENT_OTHER): Payer: BC Managed Care – PPO | Admitting: Oncology

## 2019-11-16 VITALS — BP 136/86 | HR 75

## 2019-11-16 VITALS — BP 142/92 | HR 75 | Temp 96.9°F | Resp 16 | Wt 231.7 lb

## 2019-11-16 DIAGNOSIS — D751 Secondary polycythemia: Secondary | ICD-10-CM | POA: Diagnosis not present

## 2019-11-16 DIAGNOSIS — E669 Obesity, unspecified: Secondary | ICD-10-CM | POA: Insufficient documentation

## 2019-11-16 LAB — CBC
HCT: 50 % (ref 39.0–52.0)
Hemoglobin: 16.8 g/dL (ref 13.0–17.0)
MCH: 29.1 pg (ref 26.0–34.0)
MCHC: 33.6 g/dL (ref 30.0–36.0)
MCV: 86.7 fL (ref 80.0–100.0)
Platelets: 330 10*3/uL (ref 150–400)
RBC: 5.77 MIL/uL (ref 4.22–5.81)
RDW: 14 % (ref 11.5–15.5)
WBC: 10.4 10*3/uL (ref 4.0–10.5)
nRBC: 0 % (ref 0.0–0.2)

## 2019-11-16 NOTE — Progress Notes (Signed)
Pt doing great, no c/o. Still smoking 1 1/2 PPD

## 2019-11-20 NOTE — Progress Notes (Signed)
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 Of note patient has had an elevated hemoglobin dating back to 2016 and his yearly hemoglobin trends have been 17.8, 18.8, 19.3 and then most recently 20.1 on 01/24/2019.Hemoglobin and platelet counts have been normal. He does have a chronic smoking history and currently smokes 1 pack/day and has been doing so for the last 30 years. He denies any exogenous testosterone use. He does report getting a good night sleep and wakes up in the morning feeling refreshed but does note that his partner has noticed that he snores at night. He does not have any known chronic lung disease.  EPO level was normal. JAK2 mutation testing was negative. UA showed no hematuria  Interval history-patient reports feeling well and denies any complaints at this time.  Did not feel any significantly different with or without phlebotomy in terms of his fatigue levels.  He has been evaluated by Dr. Army Melia and will be getting overnight oximetry soon to determine if he has obstructive sleep apnea.  ECOG PS- 0 Pain scale- 0   Review of systems- Review of Systems  Constitutional: Negative for chills, fever, malaise/fatigue and weight loss.  HENT: Negative for congestion, ear discharge and nosebleeds.   Eyes: Negative for blurred vision.  Respiratory: Negative for cough, hemoptysis, sputum production, shortness of breath  and wheezing.   Cardiovascular: Negative for chest pain, palpitations, orthopnea and claudication.  Gastrointestinal: Negative for abdominal pain, blood in stool, constipation, diarrhea, heartburn, melena, nausea and vomiting.  Genitourinary: Negative for dysuria, flank pain, frequency, hematuria and urgency.  Musculoskeletal: Negative for back pain, joint pain and myalgias.  Skin: Negative for rash.  Neurological: Negative for dizziness, tingling, focal weakness, seizures, weakness and headaches.  Endo/Heme/Allergies: Does not bruise/bleed easily.  Psychiatric/Behavioral: Negative for depression and suicidal ideas. The patient does not have insomnia.      No Known Allergies   Past Medical History:  Diagnosis Date  . GERD (gastroesophageal reflux disease)   . Panic attacks   . Polycythemia      Past Surgical History:  Procedure Laterality Date  . COLONOSCOPY WITH PROPOFOL N/A 06/18/2019   Procedure: COLONOSCOPY WITH BIOPSY;  Surgeon: Lucilla Lame, MD;  Location: [place];  Service: Endoscopy;  Laterality: N/A;  . INGUINAL HERNIA REPAIR Left 10/28/2015   Procedure: HERNIA REPAIR INGUINAL ADULT;  Surgeon: Hubbard Robinson, MD;  Location: [facility];  Service: General;  Laterality: Left;  . POLYPECTOMY  06/18/2019   Procedure: POLYPECTOMY;  Surgeon: Lucilla Lame, MD;  Location: [place];  Service: Endoscopy;;  . SKIN SURGERY     Tumor-benign  . TONSILLECTOMY AND ADENOIDECTOMY    . TUMOR REMOVAL    . VENTRAL HERNIA REPAIR  1968   Dr. Mare Ferrari    Social History   Socioeconomic History  . Marital status: Divorced    Spouse name: Not on file  . Number of children: Not on file  . Years of education: Not on file  . Highest education level:  Not on file  Occupational History  . Not on file  Tobacco Use  . Smoking status: Current Every Day Smoker    Packs/day: 1.50    Years: 30.00    Pack years: 45.00    Types: Cigarettes    Start date: 10/22/1983     Last attempt to quit: 10/08/2015    Years since quitting: 4.1  . Smokeless tobacco: Never Used  . Tobacco comment: off and on since 1985  Substance and Sexual Activity  . Alcohol use: Yes    Alcohol/week: 8.0 standard drinks    Types: 8 Standard drinks or equivalent per week  . Drug use: Yes    Comment: every now and then about 1 time month  . Sexual activity: Yes  Other Topics Concern  . Not on file  Social History Narrative  . Not on file   Social Determinants of Health   Financial Resource Strain:   . Difficulty of Paying Living Expenses:   Food Insecurity:   . Worried About Charity fundraiser in the Last Year:   . Arboriculturist in the Last Year:   Transportation Needs:   . Film/video editor (Medical):   Marland Kitchen Lack of Transportation (Non-Medical):   Physical Activity:   . Days of Exercise per Week:   . Minutes of Exercise per Session:   Stress:   . Feeling of Stress :   Social Connections:   . Frequency of Communication with Friends and Family:   . Frequency of Social Gatherings with Friends and Family:   . Attends Religious Services:   . Active Member of Clubs or Organizations:   . Attends Archivist Meetings:   Marland Kitchen Marital Status:   Intimate Partner Violence:   . Fear of Current or Ex-Partner:   . Emotionally Abused:   Marland Kitchen Physically Abused:   . Sexually Abused:     Family History  Problem Relation Age of Onset  . Thyroid disease Mother   . Heart disease Father        heart attack  . Diabetes Father   . Lung cancer Maternal Aunt   . Cancer Maternal Aunt        lung cancer  . Heart disease Paternal Uncle   . Cancer Maternal Grandfather        lung cancer  . Prostate cancer Brother 6  . Lupus Brother      Current Outpatient Medications:  .  ASPIRIN 81 PO, Take by mouth daily., Disp: , Rfl:  .  cetirizine (ZYRTEC) 10 MG tablet, Take 10 mg by mouth daily., Disp: , Rfl:  .  fluticasone (FLONASE) 50 MCG/ACT nasal spray, Place 2 sprays into both  nostrils daily. (Patient taking differently: Place 2 sprays into both nostrils daily as needed. ), Disp: 16 g, Rfl: 6 .  LORazepam (ATIVAN) 0.5 MG tablet, Take 1 tablet (0.5 mg total) by mouth daily as needed for anxiety., Disp: 10 tablet, Rfl: 0 .  Multiple Vitamin (MULTIVITAMIN) capsule, Take 1 capsule by mouth daily., Disp: , Rfl:  .  omeprazole (PRILOSEC) 10 MG capsule, Take 10 mg by mouth daily., Disp: , Rfl:  .  simvastatin (ZOCOR) 10 MG tablet, Take 1 tablet (10 mg total) by mouth at bedtime., Disp: 90 tablet, Rfl: 3  Physical exam:  Vitals:   11/16/19 1315  BP: (!) 142/92  Pulse: 75  Resp: 16  Temp: (!) 96.9 F (36.1 C)  TempSrc: Tympanic  Weight: 231 lb 11.2  oz (105.1 kg)   Physical Exam Constitutional:      General: He is not in acute distress. HENT:     Head: Normocephalic and atraumatic.  Eyes:     Pupils: Pupils are equal, round, and reactive to light.  Cardiovascular:     Rate and Rhythm: Normal rate and regular rhythm.     Heart sounds: Normal heart sounds.  Pulmonary:     Effort: Pulmonary effort is normal.     Breath sounds: Normal breath sounds.  Abdominal:     General: Bowel sounds are normal.     Palpations: Abdomen is soft.  Musculoskeletal:     Cervical back: Normal range of motion.  Skin:    General: Skin is warm and dry.  Neurological:     Mental Status: He is alert and oriented to person, place, and time.      CMP Latest Ref Rng & Units 01/24/2019  Glucose 65 - 99 mg/dL 104(H)  BUN 6 - 24 mg/dL 10  Creatinine 0.76 - 1.27 mg/dL 1.07  Sodium 134 - 144 mmol/L 137  Potassium 3.5 - 5.2 mmol/L 5.0  Chloride 96 - 106 mmol/L 100  CO2 20 - 29 mmol/L 21  Calcium 8.7 - 10.2 mg/dL 9.8  Total Protein 6.0 - 8.5 g/dL 6.8  Total Bilirubin 0.0 - 1.2 mg/dL 0.9  Alkaline Phos 39 - 117 IU/L 112  AST 0 - 40 IU/L 25  ALT 0 - 44 IU/L 30   CBC Latest Ref Rng & Units 11/16/2019  WBC 4.0 - 10.5 K/uL 10.4  Hemoglobin 13.0 - 17.0 g/dL 16.8  Hematocrit 39.0 -  52.0 % 50.0  Platelets 150 - 400 K/uL 330     Assessment and plan- Patient is a 54 y.o. 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  He has not received phlebotomy since January.  His hematocrit is 50 today I will proceed with phlebotomy at this time.  He will receive 500 cc of phlebotomy today since he is relatively young and can tolerate it well.  He will come back every 2 weeks but if his hematocrit is less than 50 he does not require phlebotomy.  Repeat CBC in 3 months in 6 months and I will see him back in 6 months.  If he is eventually diagnosed with obstructive sleep apnea and starts using his CPAP his hematocrit may improve   Visit Diagnosis 1. Polycythemia, secondary      Dr. Randa Evens, MD, MPH [facility] [id number] 11/20/2019 9:39 AM

## 2019-11-30 ENCOUNTER — Inpatient Hospital Stay: Payer: BC Managed Care – PPO

## 2019-11-30 ENCOUNTER — Other Ambulatory Visit: Payer: Self-pay

## 2019-11-30 DIAGNOSIS — D751 Secondary polycythemia: Secondary | ICD-10-CM | POA: Diagnosis not present

## 2019-11-30 LAB — HEMOGLOBIN AND HEMATOCRIT, BLOOD
HCT: 45.1 % (ref 39.0–52.0)
Hemoglobin: 15 g/dL (ref 13.0–17.0)

## 2019-11-30 NOTE — Progress Notes (Signed)
Labs reviewed with MD. Per MD pt does not need a phlebotomy today. Pt updated and all questions answered at this time.   Horacio Werth [employer]

## 2019-12-14 ENCOUNTER — Ambulatory Visit: Payer: BC Managed Care – PPO | Admitting: Oncology

## 2019-12-14 ENCOUNTER — Inpatient Hospital Stay: Payer: BC Managed Care – PPO

## 2019-12-21 ENCOUNTER — Ambulatory Visit: Payer: BC Managed Care – PPO | Admitting: Oncology

## 2019-12-28 ENCOUNTER — Inpatient Hospital Stay: Payer: BC Managed Care – PPO

## 2020-01-02 ENCOUNTER — Encounter: Payer: Self-pay | Admitting: Internal Medicine

## 2020-01-02 ENCOUNTER — Ambulatory Visit: Payer: BC Managed Care – PPO | Admitting: Internal Medicine

## 2020-01-02 ENCOUNTER — Other Ambulatory Visit: Payer: Self-pay

## 2020-01-02 VITALS — BP 126/82 | HR 81 | Temp 98.0°F | Ht 72.0 in | Wt 224.0 lb

## 2020-01-02 DIAGNOSIS — N341 Nonspecific urethritis: Secondary | ICD-10-CM

## 2020-01-02 DIAGNOSIS — R822 Biliuria: Secondary | ICD-10-CM | POA: Diagnosis not present

## 2020-01-02 LAB — POCT URINALYSIS DIPSTICK
Blood, UA: NEGATIVE
Glucose, UA: NEGATIVE
Ketones, UA: NEGATIVE
Leukocytes, UA: NEGATIVE
Nitrite, UA: NEGATIVE
Protein, UA: NEGATIVE
Spec Grav, UA: >=1.03 — AB (ref 1.010–1.025)
Urobilinogen, UA: 0.2 E.U./dL
pH, UA: 6 (ref 5.0–8.0)

## 2020-01-02 MED ORDER — DOXYCYCLINE HYCLATE 100 MG PO TABS: 100.0000 mg | ORAL_TABLET | Freq: Two times a day (BID) | ORAL | 0 refills | Status: AC

## 2020-01-02 NOTE — Progress Notes (Signed)
Date:  01/02/2020   Name:  Harold Carroll   DOB:  Mar 20, 1966   MRN:  [id number]   Chief Complaint: Urinary Tract Infection (X2-3 days lower stomach is hurting, tingling when he pees, no itching or burning, urine is orange )  Dysuria  This is a new problem. The current episode started in the past 7 days. The problem occurs every urination. The quality of the pain is described as burning. The pain is mild. There has been no fever. Pertinent negatives include no chills, hematuria, nausea or sweats. He has tried nothing for the symptoms.  No concern for STI - same partner for three years.  No bike or motorcycle riding, no tractor or lawn mower use on a regular basis.  Lab Results  Component Value Date   CREATININE 1.07 01/24/2019   BUN 10 01/24/2019   NA 137 01/24/2019   K 5.0 01/24/2019   CL 100 01/24/2019   CO2 21 01/24/2019   Lab Results  Component Value Date   CHOL 236 (H) 01/24/2019   HDL 40 01/24/2019   LDLCALC 159 (H) 01/24/2019   TRIG 186 (H) 01/24/2019   CHOLHDL 5.9 (H) 01/24/2019   Lab Results  Component Value Date   TSH 1.240 09/29/2015   Lab Results  Component Value Date   HGBA1C 5.6 09/29/2015   Lab Results  Component Value Date   WBC 10.4 11/16/2019   HGB 15.0 11/30/2019   HCT 45.1 11/30/2019   MCV 86.7 11/16/2019   PLT 330 11/16/2019   Lab Results  Component Value Date   ALT 30 01/24/2019   AST 25 01/24/2019   ALKPHOS 112 01/24/2019   BILITOT 0.9 01/24/2019     Review of Systems  Constitutional: Negative for chills, fatigue and fever.  Respiratory: Negative for chest tightness and shortness of breath.   Cardiovascular: Negative for chest pain and palpitations.  Gastrointestinal: Positive for abdominal pain (discomfort at the site of hernia repair with mesh). Negative for nausea.  Genitourinary: Positive for dysuria. Negative for discharge, genital sores, hematuria, penile pain (tingling at the end of the penis with urination) and testicular  pain.    Patient Active Problem List   Diagnosis Date Noted  . Special screening for malignant neoplasms, colon   . Polyp of sigmoid colon   . Polycythemia 02/19/2019  . Mixed hyperlipidemia 10/02/2015  . Overweight (BMI 25.0-29.9) 09/24/2015  . Tobacco use disorder 09/24/2015  . Panic disorder 09/24/2015  . FH: heart disease 09/24/2015  . FH: diabetes mellitus 09/24/2015  . GERD (gastroesophageal reflux disease) 09/24/2015  . Seasonal allergic rhinitis 09/24/2015    No Known Allergies  Past Surgical History:  Procedure Laterality Date  . COLONOSCOPY WITH PROPOFOL N/A 06/18/2019   Procedure: COLONOSCOPY WITH BIOPSY;  Surgeon: Lucilla Lame, MD;  Location: [place];  Service: Endoscopy;  Laterality: N/A;  . INGUINAL HERNIA REPAIR Left 10/28/2015   Procedure: HERNIA REPAIR INGUINAL ADULT;  Surgeon: Hubbard Robinson, MD;  Location: [facility];  Service: General;  Laterality: Left;  . POLYPECTOMY  06/18/2019   Procedure: POLYPECTOMY;  Surgeon: Lucilla Lame, MD;  Location: [place];  Service: Endoscopy;;  . SKIN SURGERY     Tumor-benign  . TONSILLECTOMY AND ADENOIDECTOMY    . TUMOR REMOVAL    . VENTRAL HERNIA REPAIR  1968   Dr. Mare Ferrari    Social History   Tobacco Use  . Smoking status: Current Every Day Smoker    Packs/day: 1.50  Years: 30.00    Pack years: 45.00    Types: Cigarettes    Start date: 10/22/1983    Last attempt to quit: 10/08/2015    Years since quitting: 4.2  . Smokeless tobacco: Never Used  . Tobacco comment: off and on since 1985  Substance Use Topics  . Alcohol use: Yes    Alcohol/week: 8.0 standard drinks    Types: 8 Standard drinks or equivalent per week  . Drug use: Yes    Comment: every now and then about 1 time month     Medication list has been reviewed and updated.  Current Meds  Medication Sig  . ASPIRIN 81 PO Take by mouth daily.  . cetirizine (ZYRTEC) 10 MG tablet Take 10 mg by mouth daily.  . fluticasone  (FLONASE) 50 MCG/ACT nasal spray Place 2 sprays into both nostrils daily. (Patient taking differently: Place 2 sprays into both nostrils daily as needed. )  . LORazepam (ATIVAN) 0.5 MG tablet Take 1 tablet (0.5 mg total) by mouth daily as needed for anxiety.  . Multiple Vitamin (MULTIVITAMIN) capsule Take 1 capsule by mouth daily.  Marland Kitchen omeprazole (PRILOSEC) 10 MG capsule Take 10 mg by mouth daily.  . simvastatin (ZOCOR) 10 MG tablet Take 1 tablet (10 mg total) by mouth at bedtime.    PHQ 2/9 Scores 01/02/2020 07/12/2019 01/24/2019 04/22/2017  PHQ - 2 Score 0 0 0 0  PHQ- 9 Score 0 - - -    BP Readings from Last 3 Encounters:  01/02/20 126/82  11/16/19 136/86  11/16/19 (!) 142/92    Physical Exam Vitals and nursing note reviewed.  Constitutional:      General: He is not in acute distress.    Appearance: He is well-developed.  HENT:     Head: Normocephalic and atraumatic.  Cardiovascular:     Rate and Rhythm: Normal rate and regular rhythm.  Pulmonary:     Effort: Pulmonary effort is normal. No respiratory distress.  Abdominal:     General: Bowel sounds are normal.     Palpations: Abdomen is soft.     Tenderness: There is abdominal tenderness (mild LLQ tenderness at hernia repair site). There is no guarding or rebound.     Hernia: No hernia is present.  Musculoskeletal:        General: Normal range of motion.  Skin:    General: Skin is warm and dry.     Findings: No rash.  Neurological:     Mental Status: He is alert and oriented to person, place, and time.  Psychiatric:        Behavior: Behavior normal.        Thought Content: Thought content normal.     Wt Readings from Last 3 Encounters:  01/02/20 224 lb (101.6 kg)  11/16/19 231 lb 11.2 oz (105.1 kg)  07/12/19 226 lb (102.5 kg)    BP 126/82   Pulse 81   Temp 98 F (36.7 C) (Oral)   Ht 6' (1.829 m)   Wt 224 lb (101.6 kg)   SpO2 96%   BMI 30.38 kg/m   Assessment and Plan: 1. Urethritis, nongonococcal Continue  fluids Follow up if no improvement - doxycycline (VIBRA-TABS) 100 MG tablet; Take 1 tablet (100 mg total) by mouth 2 (two) times daily for 7 days.  Dispense: 14 tablet; Refill: 0 - POCT urinalysis dipstick  2. Bilirubinuria Started on Zocor 6 month ago; no recent LFT check - Hepatic function panel   Partially dictated using  Editor, commissioning. Any errors are unintentional.  Halina Maidens, MD [facility]  01/02/2020

## 2020-01-03 LAB — HEPATIC FUNCTION PANEL
ALT: 20 IU/L (ref 0–44)
AST: 21 IU/L (ref 0–40)
Albumin: 4.4 g/dL (ref 3.8–4.9)
Alkaline Phosphatase: 151 IU/L — ABNORMAL HIGH (ref 48–121)
Bilirubin Total: 0.6 mg/dL (ref 0.0–1.2)
Bilirubin, Direct: 0.23 mg/dL (ref 0.00–0.40)
Total Protein: 7.1 g/dL (ref 6.0–8.5)

## 2020-01-11 ENCOUNTER — Inpatient Hospital Stay: Payer: BC Managed Care – PPO

## 2020-01-14 ENCOUNTER — Ambulatory Visit: Payer: BC Managed Care – PPO | Admitting: Urology

## 2020-01-14 ENCOUNTER — Ambulatory Visit: Payer: BC Managed Care – PPO | Admitting: Internal Medicine

## 2020-01-14 ENCOUNTER — Other Ambulatory Visit: Payer: Self-pay

## 2020-01-14 ENCOUNTER — Encounter: Payer: Self-pay | Admitting: Internal Medicine

## 2020-01-14 ENCOUNTER — Encounter: Payer: Self-pay | Admitting: Urology

## 2020-01-14 VITALS — BP 147/88 | HR 105 | Ht 72.0 in | Wt 224.0 lb

## 2020-01-14 VITALS — BP 138/84 | HR 111 | Temp 97.7°F | Ht 72.0 in | Wt 224.0 lb

## 2020-01-14 DIAGNOSIS — R399 Unspecified symptoms and signs involving the genitourinary system: Secondary | ICD-10-CM | POA: Diagnosis not present

## 2020-01-14 DIAGNOSIS — R350 Frequency of micturition: Secondary | ICD-10-CM | POA: Diagnosis not present

## 2020-01-14 DIAGNOSIS — R339 Retention of urine, unspecified: Secondary | ICD-10-CM | POA: Diagnosis not present

## 2020-01-14 DIAGNOSIS — Z8042 Family history of malignant neoplasm of prostate: Secondary | ICD-10-CM | POA: Diagnosis not present

## 2020-01-14 LAB — POCT URINALYSIS DIPSTICK
Bilirubin, UA: NEGATIVE
Blood, UA: NEGATIVE
Glucose, UA: NEGATIVE
Ketones, UA: NEGATIVE
Leukocytes, UA: NEGATIVE
Nitrite, UA: NEGATIVE
Protein, UA: POSITIVE — AB
Spec Grav, UA: 1.02 (ref 1.010–1.025)
Urobilinogen, UA: 0.2 E.U./dL
pH, UA: 6 (ref 5.0–8.0)

## 2020-01-14 LAB — BLADDER SCAN AMB NON-IMAGING: Scan Result: 50

## 2020-01-14 MED ORDER — TAMSULOSIN HCL 0.4 MG PO CAPS: 0.4000 mg | ORAL_CAPSULE | Freq: Every day | ORAL | 0 refills | Status: DC

## 2020-01-14 NOTE — Progress Notes (Signed)
01/14/2020 11:30 PM   [place] 1966-03-22 [id number]  Referring provider: Glean Hess, MD [address] [place],  [place] [address]  Chief Complaint  Patient presents with  . New Patient (Initial Visit)    urine retention    HPI: Harold Carroll is a 54 year old male with difficulty urinating who presents today for an urgent appointment.    He was seen initially by Dr. Army Melia on January 02, 2020 for symptoms of lower stomach pain, tingling when urinating and dark-colored urine.  The symptoms started 7 days prior to his visit with her.  He was started on doxycycline.  Urine dip demonstrated concentrated urine, but otherwise negative.  At his follow-up visit today, he started to complain of straining to have to get the urine out and Dr. Army Melia was concerned for possible urinary retention.  She then reached out to me to see if we be available to see the patient this afternoon.  He states that over the last year he has noted the slowing of his urinary stream, with a weakening of his urinary stream, straining to urinate and a split urinary stream.  He denies any history of recurrent urinary tract infections.  He has also had the onset of 2 to 3 weeks of left lower quadrant pain.  Patient denies any modifying or aggravating factors.  Patient denies any gross hematuria, dysuria or suprapubic/flank pain.  Patient denies any fevers, chills, nausea or vomiting.  His urine dip was positive for protein.  His PVR is 50 mL.    He has a remote history of prostatitis.  He has a brother with prostate cancer diagnosed at the age of 44 treated with seed implants.  His most recent PSA was 0.7 in June 2020.  PMH: Past Medical History:  Diagnosis Date  . GERD (gastroesophageal reflux disease)   . Panic attacks   . Polycythemia     Surgical History: Past Surgical History:  Procedure Laterality Date  . COLONOSCOPY WITH PROPOFOL N/A 06/18/2019   Procedure: COLONOSCOPY WITH  BIOPSY;  Surgeon: Lucilla Lame, MD;  Location: [place];  Service: Endoscopy;  Laterality: N/A;  . INGUINAL HERNIA REPAIR Left 10/28/2015   Procedure: HERNIA REPAIR INGUINAL ADULT;  Surgeon: Hubbard Robinson, MD;  Location: [facility];  Service: General;  Laterality: Left;  . POLYPECTOMY  06/18/2019   Procedure: POLYPECTOMY;  Surgeon: Lucilla Lame, MD;  Location: [place];  Service: Endoscopy;;  . SKIN SURGERY     Tumor-benign  . TONSILLECTOMY AND ADENOIDECTOMY    . TUMOR REMOVAL    . VENTRAL HERNIA REPAIR  1968   Dr. Mare Ferrari    Home Medications:  Allergies as of 01/14/2020   No Known Allergies     Medication List       Accurate as of January 14, 2020 11:30 PM. If you have any questions, ask your nurse or doctor.        ASPIRIN 81 PO Take by mouth daily.   cetirizine 10 MG tablet Commonly known as: ZYRTEC Take 10 mg by mouth daily.   fluticasone 50 MCG/ACT nasal spray Commonly known as: FLONASE Place 2 sprays into both nostrils daily.   LORazepam 0.5 MG tablet Commonly known as: ATIVAN Take 1 tablet (0.5 mg total) by mouth daily as needed for anxiety.   multivitamin capsule Take 1 capsule by mouth daily.   omeprazole 10 MG capsule Commonly known as: PRILOSEC Take 10 mg by mouth daily.  simvastatin 10 MG tablet Commonly known as: ZOCOR Take 1 tablet (10 mg total) by mouth at bedtime.   tamsulosin 0.4 MG Caps capsule Commonly known as: FLOMAX Take 1 capsule (0.4 mg total) by mouth daily. Started by: Zara Council, PA-C       Allergies: No Known Allergies  Family History: Family History  Problem Relation Age of Onset  . Thyroid disease Mother   . Heart disease Father        heart attack  . Diabetes Father   . Lung cancer Maternal Aunt   . Cancer Maternal Aunt        lung cancer  . Heart disease Paternal Uncle   . Cancer Maternal Grandfather        lung cancer  . Prostate cancer Brother 53  . Lupus Brother     Social  History:  reports that he has been smoking cigarettes. He started smoking about 36 years ago. He has a 45.00 pack-year smoking history. He has never used smokeless tobacco. He reports current alcohol use of about 8.0 standard drinks of alcohol per week. He reports current drug use.  ROS: Pertinent ROS in HPI  Physical Exam: BP (!) 147/88   Pulse (!) 105   Ht 6' (1.829 m)   Wt 224 lb (101.6 kg)   BMI 30.38 kg/m   Constitutional:  Well nourished. Alert and oriented, No acute distress. HEENT: [place] AT, mask in place.  Trachea midline. Cardiovascular: No clubbing, cyanosis, or edema. Respiratory: Normal respiratory effort, no increased work of breathing. GI: Abdomen is soft, non tender, non distended, no abdominal masses. Liver and spleen not palpable.  No hernias appreciated.  Stool sample for occult testing is not indicated.   GU: No CVA tenderness.  No bladder fullness or masses.  Rest of exam deferred to Dr. Gaspar Cola exam this afternoon. Rectal:  Deferred to Dr. Gaspar Cola exam this afternoon Skin: No rashes, bruises or suspicious lesions. Lymph: No inguinal adenopathy. Neurologic: Grossly intact, no focal deficits, moving all 4 extremities. Psychiatric: Normal mood and affect.  Laboratory Data: Lab Results  Component Value Date   WBC 10.4 11/16/2019   HGB 15.0 11/30/2019   HCT 45.1 11/30/2019   MCV 86.7 11/16/2019   PLT 330 11/16/2019    Lab Results  Component Value Date   CREATININE 1.07 01/24/2019     Lab Results  Component Value Date   HGBA1C 5.6 09/29/2015    Lab Results  Component Value Date   TSH 1.240 09/29/2015       Component Value Date/Time   CHOL 236 (H) 01/24/2019 0833   HDL 40 01/24/2019 0833   CHOLHDL 5.9 (H) 01/24/2019 0833   LDLCALC 159 (H) 01/24/2019 0833    Lab Results  Component Value Date   AST 21 01/02/2020   Lab Results  Component Value Date   ALT 20 01/02/2020    Urinalysis    Component Value Date/Time   COLORURINE YELLOW  (A) 02/19/2019 1422   APPEARANCEUR HAZY (A) 02/19/2019 1422   LABSPEC 1.018 02/19/2019 1422   PHURINE 5.0 02/19/2019 1422   GLUCOSEU NEGATIVE 02/19/2019 1422   HGBUR NEGATIVE 02/19/2019 1422   BILIRUBINUR neg 01/14/2020 1546   KETONESUR NEGATIVE 02/19/2019 1422   PROTEINUR Positive (A) 01/14/2020 1546   PROTEINUR NEGATIVE 02/19/2019 1422   UROBILINOGEN 0.2 01/14/2020 1546   NITRITE neg 01/14/2020 1546   NITRITE NEGATIVE 02/19/2019 1422   LEUKOCYTESUR Negative 01/14/2020 1546   LEUKOCYTESUR TRACE (A) 02/19/2019 1422  I have reviewed the labs.   Pertinent Imaging: Results for Harold Carroll, Harold Carroll (MRN [id number]) as of 01/14/2020 23:49  Ref. Range 01/14/2020 16:34  Scan Result Unknown 50 ml   Assessment & Plan:    1. LU TS Patient with urinary symptoms of a splitting stream, straining to urinate and weakening of stream I discussed with the patient possible causes for his symptoms such as urethral stricture, BPH or overactive bladder We will start tamsulosin 0.4 mg daily and he will return in 2 weeks to see one of our physicians - he will have a PSA and UA prior to the visit  I explained that if the tamsulosin was not found to be effective in improving his symptoms, they may choose to have him undergo a cystoscopy for further evaluation and management I have explained to the patient that they will  be scheduled for a cystoscopy in our office to evaluate their bladder.  The cystoscopy consists of passing a tube with a lens up through their urethra and into their urinary bladder.   We will inject the urethra with a lidocaine gel prior to introducing the cystoscope to help with any discomfort during the procedure.   After the procedure, they might experience blood in the urine and discomfort with urination.  This will abate after the first few voids.  I have  encouraged the patient to increase water intake  during this time.  Patient denies any allergies to lidocaine.   2. Family history  of prostate cancer PSA will be drawn at the time of his next visit  Return in about 2 weeks (around 01/28/2020) for I PSS, PVR, UA and PSA with physician (Dr. Diamantina Providence or Dr. Erlene Quan in [place]) - may need cysto .  These notes generated with voice recognition software. I apologize for typographical errors.  Zara Council, PA-C  [facility] [address]  [place], [place] [address] [phone]

## 2020-01-14 NOTE — Progress Notes (Signed)
Date:  01/14/2020   Name:  Harold Carroll   DOB:  03-May-1966   MRN:  [id number]   Chief Complaint: Urinary Retention  Urinary Frequency  This is a new problem. The current episode started in the past 7 days. The problem occurs every urination. The problem has been unchanged. Quality: no discomfort but has to strain to get any urine out. The patient is experiencing no pain. There has been no fever. He is sexually active. There is no history of pyelonephritis. Associated symptoms include frequency. Pertinent negatives include no chills or hematuria. Treatments tried: finished a course of Doxy. The treatment provided significant (of dysuria but the retention sx started 5 days ago) relief. There is no history of kidney stones, recurrent UTIs or a urological procedure.    Lab Results  Component Value Date   CREATININE 1.07 01/24/2019   BUN 10 01/24/2019   NA 137 01/24/2019   K 5.0 01/24/2019   CL 100 01/24/2019   CO2 21 01/24/2019   Lab Results  Component Value Date   CHOL 236 (H) 01/24/2019   HDL 40 01/24/2019   LDLCALC 159 (H) 01/24/2019   TRIG 186 (H) 01/24/2019   CHOLHDL 5.9 (H) 01/24/2019   Lab Results  Component Value Date   TSH 1.240 09/29/2015   Lab Results  Component Value Date   HGBA1C 5.6 09/29/2015   Lab Results  Component Value Date   WBC 10.4 11/16/2019   HGB 15.0 11/30/2019   HCT 45.1 11/30/2019   MCV 86.7 11/16/2019   PLT 330 11/16/2019   Lab Results  Component Value Date   ALT 20 01/02/2020   AST 21 01/02/2020   ALKPHOS 151 (H) 01/02/2020   BILITOT 0.6 01/02/2020     Review of Systems  Constitutional: Negative for chills, fatigue and fever.  Respiratory: Negative for chest tightness and shortness of breath.   Cardiovascular: Negative for chest pain.  Gastrointestinal: Positive for abdominal pain (discomfort in the LLQ near his mesh). Negative for constipation and diarrhea.  Genitourinary: Positive for frequency. Negative for discharge,  genital sores and hematuria.  Neurological: Negative for dizziness and headaches.    Patient Active Problem List   Diagnosis Date Noted  . Special screening for malignant neoplasms, colon   . Polyp of sigmoid colon   . Polycythemia 02/19/2019  . Mixed hyperlipidemia 10/02/2015  . Overweight (BMI 25.0-29.9) 09/24/2015  . Tobacco use disorder 09/24/2015  . Panic disorder 09/24/2015  . FH: heart disease 09/24/2015  . FH: diabetes mellitus 09/24/2015  . GERD (gastroesophageal reflux disease) 09/24/2015  . Seasonal allergic rhinitis 09/24/2015    No Known Allergies  Past Surgical History:  Procedure Laterality Date  . COLONOSCOPY WITH PROPOFOL N/A 06/18/2019   Procedure: COLONOSCOPY WITH BIOPSY;  Surgeon: Lucilla Lame, MD;  Location: [facility];  Service: Endoscopy;  Laterality: N/A;  . INGUINAL HERNIA REPAIR Left 10/28/2015   Procedure: HERNIA REPAIR INGUINAL ADULT;  Surgeon: Hubbard Robinson, MD;  Location: [facility];  Service: General;  Laterality: Left;  . POLYPECTOMY  06/18/2019   Procedure: POLYPECTOMY;  Surgeon: Lucilla Lame, MD;  Location: [facility];  Service: Endoscopy;;  . SKIN SURGERY     Tumor-benign  . TONSILLECTOMY AND ADENOIDECTOMY    . TUMOR REMOVAL    . VENTRAL HERNIA REPAIR  1968   Dr. Mare Ferrari    Social History   Tobacco Use  . Smoking status: Current Every Day Smoker    Packs/day: 1.50  Years: 30.00    Pack years: 45.00    Types: Cigarettes    Start date: 10/22/1983    Last attempt to quit: 10/08/2015    Years since quitting: 4.2  . Smokeless tobacco: Never Used  . Tobacco comment: off and on since 1985  Vaping Use  . Vaping Use: Never used  Substance Use Topics  . Alcohol use: Yes    Alcohol/week: 8.0 standard drinks    Types: 8 Standard drinks or equivalent per week  . Drug use: Yes    Comment: every now and then about 1 time month     Medication list has been reviewed and updated.  Current Meds  Medication Sig  .  ASPIRIN 81 PO Take by mouth daily.  . cetirizine (ZYRTEC) 10 MG tablet Take 10 mg by mouth daily.  . fluticasone (FLONASE) 50 MCG/ACT nasal spray Place 2 sprays into both nostrils daily. (Patient taking differently: Place 2 sprays into both nostrils daily as needed. )  . LORazepam (ATIVAN) 0.5 MG tablet Take 1 tablet (0.5 mg total) by mouth daily as needed for anxiety.  . Multiple Vitamin (MULTIVITAMIN) capsule Take 1 capsule by mouth daily.  Marland Kitchen omeprazole (PRILOSEC) 10 MG capsule Take 10 mg by mouth daily.  . simvastatin (ZOCOR) 10 MG tablet Take 1 tablet (10 mg total) by mouth at bedtime.    PHQ 2/9 Scores 01/14/2020 01/02/2020 07/12/2019 01/24/2019  PHQ - 2 Score 0 0 0 0  PHQ- 9 Score 0 0 - -    GAD 7 : Generalized Anxiety Score 01/14/2020 01/02/2020 07/12/2019 09/24/2015  Nervous, Anxious, on Edge 0 0 0 1  Control/stop worrying 0 0 0 0  Worry too much - different things 0 0 0 0  Trouble relaxing 0 0 0 0  Restless 0 0 0 0  Easily annoyed or irritable 0 0 0 0  Afraid - awful might happen 0 0 0 0  Total GAD 7 Score 0 0 0 1  Anxiety Difficulty Not difficult at all Not difficult at all Not difficult at all Not difficult at all    BP Readings from Last 3 Encounters:  01/14/20 138/84  01/02/20 126/82  11/16/19 136/86    Physical Exam Vitals and nursing note reviewed.  Constitutional:      General: He is not in acute distress.    Appearance: He is well-developed.  HENT:     Head: Normocephalic and atraumatic.  Pulmonary:     Effort: Pulmonary effort is normal. No respiratory distress.  Abdominal:     General: Bowel sounds are decreased. There is distension.     Palpations: Abdomen is soft.     Tenderness: There is abdominal tenderness in the left lower quadrant.  Genitourinary:    Prostate: Not enlarged, not tender and no nodules present.     Rectum: External hemorrhoid present.  Musculoskeletal:        General: Normal range of motion.  Skin:    General: Skin is warm and dry.      Findings: No rash.  Neurological:     Mental Status: He is alert and oriented to person, place, and time.  Psychiatric:        Behavior: Behavior normal.        Thought Content: Thought content normal.     Wt Readings from Last 3 Encounters:  01/14/20 224 lb (101.6 kg)  01/02/20 224 lb (101.6 kg)  11/16/19 231 lb 11.2 oz (105.1 kg)    BP 138/84 (  BP Location: Right Arm, Patient Position: Sitting, Cuff Size: Large)   Pulse (!) 111   Temp 97.7 F (36.5 C) (Oral)   Ht 6' (1.829 m)   Wt 224 lb (101.6 kg)   SpO2 98%   BMI 30.38 kg/m   Assessment and Plan: 1. Urinary retention Symptoms of moderate retention without obvious cause Urology has agreed to see him today so will send him down now. - POCT Urinalysis Dipstick - Ambulatory referral to Urology  Partially dictated using [place]. Any errors are unintentional.  Halina Maidens, MD [facility]  01/14/2020

## 2020-01-24 ENCOUNTER — Other Ambulatory Visit: Payer: Self-pay

## 2020-01-24 DIAGNOSIS — R399 Unspecified symptoms and signs involving the genitourinary system: Secondary | ICD-10-CM

## 2020-01-24 DIAGNOSIS — Z8042 Family history of malignant neoplasm of prostate: Secondary | ICD-10-CM

## 2020-01-24 NOTE — Progress Notes (Signed)
01/25/20 9:20 AM   Harold Carroll 25-Jan-1966 [id number]  Referring provider: Glean Hess, MD [address] [place],  [place] [address] Chief Complaint  Patient presents with  . Follow-up    2 week follow-up, IPSS, PVR, PSA, UA    HPI: Harold Carroll is a 54 y.o. M who returns today for a 2 week f/u for difficulties w/ urination.   He was seen initially by Dr. Army Melia on January 02, 2020 for symptoms of lower stomach pain, tingling when urinating and dark-colored urine.  The symptoms started 7 days prior to his visit with her.  He was started on doxycycline.  Urine dip demonstrated concentrated urine, but otherwise negative.  At his follow-up visit today, he started to complain of straining to have to get the urine out and Dr. Army Melia was concerned for possible urinary retention.  She then reached out to me to see if we be available to see the patient this afternoon.  He has a remote history of prostatitis.  He states of doing well on Flomax and is voiding well.   PVR 154 mL.   He also reports of difficulties w/ erections.  He has used Viagra with success in the past.  He notes that it was too expensive and insurance did not cover that so he stopped using it.  He has a brother with prostate cancer diagnosed at the age of 32 treated with seed implants.  His most recent PSA was 0.7 in June 2020. PSA today pending.   PMH: Past Medical History:  Diagnosis Date  . GERD (gastroesophageal reflux disease)   . Panic attacks   . Polycythemia     Surgical History: Past Surgical History:  Procedure Laterality Date  . COLONOSCOPY WITH PROPOFOL N/A 06/18/2019   Procedure: COLONOSCOPY WITH BIOPSY;  Surgeon: Lucilla Lame, MD;  Location: [facility];  Service: Endoscopy;  Laterality: N/A;  . INGUINAL HERNIA REPAIR Left 10/28/2015   Procedure: HERNIA REPAIR INGUINAL ADULT;  Surgeon: Hubbard Robinson, MD;  Location: [facility];  Service: General;   Laterality: Left;  . POLYPECTOMY  06/18/2019   Procedure: POLYPECTOMY;  Surgeon: Lucilla Lame, MD;  Location: [facility];  Service: Endoscopy;;  . SKIN SURGERY     Tumor-benign  . TONSILLECTOMY AND ADENOIDECTOMY    . TUMOR REMOVAL    . VENTRAL HERNIA REPAIR  1968   Dr. Mare Ferrari    Home Medications:  Allergies as of 01/25/2020   No Known Allergies     Medication List       Accurate as of January 25, 2020  9:20 AM. If you have any questions, ask your nurse or doctor.        ASPIRIN 81 PO Take by mouth daily.   cetirizine 10 MG tablet Commonly known as: ZYRTEC Take 10 mg by mouth daily.   fluticasone 50 MCG/ACT nasal spray Commonly known as: FLONASE Place 2 sprays into both nostrils daily.   LORazepam 0.5 MG tablet Commonly known as: ATIVAN Take 1 tablet (0.5 mg total) by mouth daily as needed for anxiety.   multivitamin capsule Take 1 capsule by mouth daily.   omeprazole 10 MG capsule Commonly known as: PRILOSEC Take 10 mg by mouth daily.   sildenafil 20 MG tablet Commonly known as: Revatio Take 1 tablet (20 mg total) by mouth as needed. Take 1-5 tabs as needed prior to intercourse Started by: Hollice Espy, MD   simvastatin 10 MG tablet Commonly known as:  ZOCOR Take 1 tablet (10 mg total) by mouth at bedtime.   tamsulosin 0.4 MG Caps capsule Commonly known as: FLOMAX Take 1 capsule (0.4 mg total) by mouth daily.       Allergies: No Known Allergies  Family History: Family History  Problem Relation Age of Onset  . Thyroid disease Mother   . Heart disease Father        heart attack  . Diabetes Father   . Lung cancer Maternal Aunt   . Cancer Maternal Aunt        lung cancer  . Heart disease Paternal Uncle   . Cancer Maternal Grandfather        lung cancer  . Prostate cancer Brother 7  . Lupus Brother     Social History:  reports that he has been smoking cigarettes. He started smoking about 36 years ago. He has a 45.00 pack-year smoking  history. He has never used smokeless tobacco. He reports current alcohol use of about 8.0 standard drinks of alcohol per week. He reports current drug use.   Physical Exam: BP (!) 143/100   Pulse 89   Ht 6' (1.829 m)   Wt 221 lb (100.2 kg)   BMI 29.97 kg/m   Constitutional:  Alert and oriented, No acute distress. HEENT: [place] AT, moist mucus membranes.  Trachea midline, no masses. Cardiovascular: No clubbing, cyanosis, or edema. Respiratory: Normal respiratory effort, no increased work of breathing. Skin: No rashes, bruises or suspicious lesions. Neurologic: Grossly intact, no focal deficits, moving all 4 extremities. Psychiatric: Normal mood and affect.  Laboratory Data:  Urinalysis [place]  Pertinent Imaging: Results for orders placed or performed in visit on 01/25/20  BLADDER SCAN AMB NON-IMAGING  Result Value Ref Range   Scan Result 154 ml    Assessment & Plan:    1. BPH w/ LUTS  PSA today - pending  Continue Flomax  We discussed alternatives including TURP, Urolift and holmium laser enucleation of the prostate for large glands.  Minimally invasive options of UroLift. Differences between the surgical procedures were discussed as well as the risks and benefits of each.  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  [facility] [address] [place], [place] [address] [phone]   I, Lucas Mallow, am acting as a scribe for Dr. Hollice Espy,  I  have reviewed the above documentation for accuracy and completeness, and I agree with the above.   Hollice Espy, MD

## 2020-01-25 ENCOUNTER — Encounter: Payer: Self-pay | Admitting: Urology

## 2020-01-25 ENCOUNTER — Inpatient Hospital Stay: Payer: BC Managed Care – PPO

## 2020-01-25 ENCOUNTER — Encounter: Payer: BC Managed Care – PPO | Admitting: Internal Medicine

## 2020-01-25 ENCOUNTER — Other Ambulatory Visit
Admission: RE | Admit: 2020-01-25 | Discharge: 2020-01-25 | Disposition: A | Discharge status: Home / Self Care | Payer: BC Managed Care – PPO | Attending: Urology | Admitting: Urology

## 2020-01-25 ENCOUNTER — Telehealth: Payer: Self-pay

## 2020-01-25 ENCOUNTER — Ambulatory Visit: Payer: BC Managed Care – PPO | Admitting: Urology

## 2020-01-25 ENCOUNTER — Other Ambulatory Visit: Payer: Self-pay

## 2020-01-25 VITALS — BP 143/100 | HR 89 | Ht 72.0 in | Wt 221.0 lb

## 2020-01-25 DIAGNOSIS — R399 Unspecified symptoms and signs involving the genitourinary system: Secondary | ICD-10-CM | POA: Insufficient documentation

## 2020-01-25 DIAGNOSIS — Z8042 Family history of malignant neoplasm of prostate: Secondary | ICD-10-CM | POA: Diagnosis not present

## 2020-01-25 DIAGNOSIS — N5203 Combined arterial insufficiency and corporo-venous occlusive erectile dysfunction: Secondary | ICD-10-CM | POA: Diagnosis not present

## 2020-01-25 LAB — URINALYSIS, COMPLETE (UACMP) WITH MICROSCOPIC
Bacteria, UA: NONE SEEN
Bilirubin Urine: NEGATIVE
Glucose, UA: NEGATIVE mg/dL
Hgb urine dipstick: NEGATIVE
Ketones, ur: NEGATIVE mg/dL
Nitrite: NEGATIVE
Protein, ur: NEGATIVE mg/dL
RBC / HPF: NONE SEEN RBC/hpf (ref 0–5)
Specific Gravity, Urine: 1.015 (ref 1.005–1.030)
Squamous Epithelial / HPF: NONE SEEN (ref 0–5)
pH: 5.5 (ref 5.0–8.0)

## 2020-01-25 LAB — PSA: Prostatic Specific Antigen: 1.17 ng/mL (ref 0.00–4.00)

## 2020-01-25 LAB — BLADDER SCAN AMB NON-IMAGING: Scan Result: 154

## 2020-01-25 MED ORDER — SILDENAFIL CITRATE 20 MG PO TABS: 20.0000 mg | ORAL_TABLET | ORAL | 11 refills | Status: DC | PRN

## 2020-01-25 NOTE — Telephone Encounter (Signed)
Patient aware of results.

## 2020-01-25 NOTE — Telephone Encounter (Signed)
-----   Message from Hollice Espy, MD sent at 01/25/2020  2:42 PM EDT ----- PSA is great, 1.17  Hollice Espy, MD

## 2020-01-25 NOTE — Addendum Note (Signed)
Addended by: Elmo Putt on: 01/25/2020 08:05 AM   Modules accepted: Orders

## 2020-02-08 ENCOUNTER — Inpatient Hospital Stay: Payer: BC Managed Care – PPO

## 2020-02-12 ENCOUNTER — Encounter: Payer: BC Managed Care – PPO | Admitting: Internal Medicine

## 2020-02-15 ENCOUNTER — Other Ambulatory Visit: Payer: Self-pay

## 2020-02-15 ENCOUNTER — Inpatient Hospital Stay: Payer: BC Managed Care – PPO

## 2020-02-15 ENCOUNTER — Inpatient Hospital Stay: Payer: BC Managed Care – PPO | Attending: Oncology

## 2020-02-15 DIAGNOSIS — D751 Secondary polycythemia: Secondary | ICD-10-CM | POA: Diagnosis present

## 2020-02-15 DIAGNOSIS — F17210 Nicotine dependence, cigarettes, uncomplicated: Secondary | ICD-10-CM | POA: Diagnosis present

## 2020-02-15 LAB — CBC
HCT: 46.6 % (ref 39.0–52.0)
Hemoglobin: 15.4 g/dL (ref 13.0–17.0)
MCH: 27.1 pg (ref 26.0–34.0)
MCHC: 33 g/dL (ref 30.0–36.0)
MCV: 82 fL (ref 80.0–100.0)
Platelets: 455 10*3/uL — ABNORMAL HIGH (ref 150–400)
RBC: 5.68 MIL/uL (ref 4.22–5.81)
RDW: 14.5 % (ref 11.5–15.5)
WBC: 15.2 10*3/uL — ABNORMAL HIGH (ref 4.0–10.5)
nRBC: 0 % (ref 0.0–0.2)

## 2020-02-18 ENCOUNTER — Other Ambulatory Visit: Payer: Self-pay

## 2020-02-18 MED ORDER — TAMSULOSIN HCL 0.4 MG PO CAPS: 0.4000 mg | ORAL_CAPSULE | Freq: Every day | ORAL | 1 refills | Status: DC

## 2020-02-22 ENCOUNTER — Inpatient Hospital Stay: Payer: BC Managed Care – PPO

## 2020-03-07 ENCOUNTER — Inpatient Hospital Stay: Payer: BC Managed Care – PPO

## 2020-03-14 ENCOUNTER — Encounter: Payer: BC Managed Care – PPO | Admitting: Internal Medicine

## 2020-03-21 ENCOUNTER — Inpatient Hospital Stay: Payer: BC Managed Care – PPO

## 2020-04-04 ENCOUNTER — Inpatient Hospital Stay: Payer: BC Managed Care – PPO

## 2020-04-18 ENCOUNTER — Inpatient Hospital Stay: Payer: BC Managed Care – PPO

## 2020-05-02 ENCOUNTER — Inpatient Hospital Stay: Payer: BC Managed Care – PPO

## 2020-05-16 ENCOUNTER — Inpatient Hospital Stay: Payer: BC Managed Care – PPO

## 2020-05-16 ENCOUNTER — Inpatient Hospital Stay: Payer: BC Managed Care – PPO | Attending: Oncology

## 2020-05-16 ENCOUNTER — Other Ambulatory Visit: Payer: Self-pay

## 2020-05-16 ENCOUNTER — Encounter: Payer: Self-pay | Admitting: Oncology

## 2020-05-16 ENCOUNTER — Inpatient Hospital Stay (HOSPITAL_BASED_OUTPATIENT_CLINIC_OR_DEPARTMENT_OTHER): Payer: BC Managed Care – PPO | Admitting: Oncology

## 2020-05-16 VITALS — BP 130/87 | HR 80 | Temp 97.3°F | Resp 18 | Wt 220.0 lb

## 2020-05-16 DIAGNOSIS — E785 Hyperlipidemia, unspecified: Secondary | ICD-10-CM | POA: Diagnosis not present

## 2020-05-16 DIAGNOSIS — D751 Secondary polycythemia: Secondary | ICD-10-CM | POA: Diagnosis not present

## 2020-05-16 DIAGNOSIS — F17210 Nicotine dependence, cigarettes, uncomplicated: Secondary | ICD-10-CM | POA: Diagnosis not present

## 2020-05-16 DIAGNOSIS — E669 Obesity, unspecified: Secondary | ICD-10-CM | POA: Insufficient documentation

## 2020-05-16 LAB — CBC
HCT: 49.8 % (ref 39.0–52.0)
Hemoglobin: 16.7 g/dL (ref 13.0–17.0)
MCH: 26.5 pg (ref 26.0–34.0)
MCHC: 33.5 g/dL (ref 30.0–36.0)
MCV: 79 fL — ABNORMAL LOW (ref 80.0–100.0)
Platelets: 357 10*3/uL (ref 150–400)
RBC: 6.3 MIL/uL — ABNORMAL HIGH (ref 4.22–5.81)
RDW: 17.6 % — ABNORMAL HIGH (ref 11.5–15.5)
WBC: 11.5 10*3/uL — ABNORMAL HIGH (ref 4.0–10.5)
nRBC: 0 % (ref 0.0–0.2)

## 2020-05-21 NOTE — Progress Notes (Signed)
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 Of note patient has had an elevated hemoglobin dating back to 2016 and his yearly hemoglobin trends have been 17.8, 18.8, 19.3 and then most recently 20.1 on 01/24/2019.Hemoglobin and platelet counts have been normal. He does have a chronic smoking history and currently smokes 1 pack/day and has been doing so for the last 30 years. He denies any exogenous testosterone use. He does report getting a good night sleep and wakes up in the morning feeling refreshed but does note that his partner has noticed that he snores at night. He does not have any known chronic lung disease.  EPO level was normal. JAK2 mutation testing was negative. UA showed no hematuria  Interval history-patient reports doing well and denies any complaints at this time  ECOG PS- 0 Pain scale- 0  Review of systems- Review of Systems  Constitutional: Positive for malaise/fatigue. Negative for chills, fever and weight loss.  HENT: Negative for congestion, ear discharge and nosebleeds.   Eyes: Negative for blurred vision.  Respiratory: Negative for cough, hemoptysis, sputum production, shortness of breath and wheezing.   Cardiovascular: Negative for chest pain, palpitations, orthopnea and claudication.  Gastrointestinal: Negative for abdominal pain, blood in stool, constipation, diarrhea, heartburn, melena, nausea and  vomiting.  Genitourinary: Negative for dysuria, flank pain, frequency, hematuria and urgency.  Musculoskeletal: Negative for back pain, joint pain and myalgias.  Skin: Negative for rash.  Neurological: Negative for dizziness, tingling, focal weakness, seizures, weakness and headaches.  Endo/Heme/Allergies: Does not bruise/bleed easily.  Psychiatric/Behavioral: Negative for depression and suicidal ideas. The patient does not have insomnia.        No Known Allergies   Past Medical History:  Diagnosis Date  . GERD (gastroesophageal reflux disease)   . Panic attacks   . Polycythemia      Past Surgical History:  Procedure Laterality Date  . COLONOSCOPY WITH PROPOFOL N/A 06/18/2019   Procedure: COLONOSCOPY WITH BIOPSY;  Surgeon: Lucilla Lame, MD;  Location: [facility];  Service: Endoscopy;  Laterality: N/A;  . INGUINAL HERNIA REPAIR Left 10/28/2015   Procedure: HERNIA REPAIR INGUINAL ADULT;  Surgeon: Hubbard Robinson, MD;  Location: [facility];  Service: General;  Laterality: Left;  . POLYPECTOMY  06/18/2019   Procedure: POLYPECTOMY;  Surgeon: Lucilla Lame, MD;  Location: [facility];  Service: Endoscopy;;  . SKIN SURGERY     Tumor-benign  . TONSILLECTOMY AND ADENOIDECTOMY    . TUMOR REMOVAL    . VENTRAL HERNIA REPAIR  1968   Dr. Mare Ferrari    Social History   Socioeconomic History  . Marital status: Divorced    Spouse name: Not on file  . Number of children: Not on file  . Years of education: Not on file  . Highest education level: Not on file  Occupational History  . Not on file  Tobacco Use  . Smoking status: Current Every Day Smoker    Packs/day: 1.50    Years: 30.00    Pack years:  45.00    Types: Cigarettes    Start date: 10/22/1983    Last attempt to quit: 10/08/2015    Years since quitting: 4.6  . Smokeless tobacco: Never Used  . Tobacco comment: off and on since 1985  Vaping Use  . Vaping Use: Never used  Substance and Sexual Activity  .  Alcohol use: Yes    Alcohol/week: 8.0 standard drinks    Types: 8 Standard drinks or equivalent per week  . Drug use: Yes    Comment: every now and then about 1 time month  . Sexual activity: Yes  Other Topics Concern  . Not on file  Social History Narrative  . Not on file   Social Determinants of Health   Financial Resource Strain:   . Difficulty of Paying Living Expenses: Not on file  Food Insecurity:   . Worried About Charity fundraiser in the Last Year: Not on file  . Ran Out of Food in the Last Year: Not on file  Transportation Needs:   . Lack of Transportation (Medical): Not on file  . Lack of Transportation (Non-Medical): Not on file  Physical Activity:   . Days of Exercise per Week: Not on file  . Minutes of Exercise per Session: Not on file  Stress:   . Feeling of Stress : Not on file  Social Connections:   . Frequency of Communication with Friends and Family: Not on file  . Frequency of Social Gatherings with Friends and Family: Not on file  . Attends Religious Services: Not on file  . Active Member of Clubs or Organizations: Not on file  . Attends Archivist Meetings: Not on file  . Marital Status: Not on file  Intimate Partner Violence:   . Fear of Current or Ex-Partner: Not on file  . Emotionally Abused: Not on file  . Physically Abused: Not on file  . Sexually Abused: Not on file    Family History  Problem Relation Age of Onset  . Thyroid disease Mother   . Heart disease Father        heart attack  . Diabetes Father   . Lung cancer Maternal Aunt   . Cancer Maternal Aunt        lung cancer  . Heart disease Paternal Uncle   . Cancer Maternal Grandfather        lung cancer  . Prostate cancer Brother 22  . Lupus Brother      Current Outpatient Medications:  .  ASPIRIN 81 PO, Take by mouth daily., Disp: , Rfl:  .  cetirizine (ZYRTEC) 10 MG tablet, Take 10 mg by mouth daily., Disp: , Rfl:  .  fluticasone (FLONASE) 50 MCG/ACT nasal spray,  Place 2 sprays into both nostrils daily., Disp: 16 g, Rfl: 6 .  LORazepam (ATIVAN) 0.5 MG tablet, Take 1 tablet (0.5 mg total) by mouth daily as needed for anxiety., Disp: 10 tablet, Rfl: 0 .  Multiple Vitamin (MULTIVITAMIN) capsule, Take 1 capsule by mouth daily., Disp: , Rfl:  .  omeprazole (PRILOSEC) 10 MG capsule, Take 10 mg by mouth daily., Disp: , Rfl:  .  sildenafil (REVATIO) 20 MG tablet, Take 1 tablet (20 mg total) by mouth as needed. Take 1-5 tabs as needed prior to intercourse, Disp: 30 tablet, Rfl: 11 .  simvastatin (ZOCOR) 10 MG tablet, Take 1 tablet (10 mg total) by mouth at bedtime., Disp: 90 tablet, Rfl: 3 .  tamsulosin (FLOMAX) 0.4 MG CAPS capsule, Take  1 capsule (0.4 mg total) by mouth daily., Disp: 90 capsule, Rfl: 1  Physical exam:  Vitals:   05/16/20 1306  BP: 130/87  Pulse: 80  Resp: 18  Temp: (!) 97.3 F (36.3 C)  TempSrc: Tympanic  SpO2: 96%  Weight: 220 lb (99.8 kg)   Physical Exam Constitutional:      General: He is not in acute distress. Eyes:     Pupils: Pupils are equal, round, and reactive to light.  Cardiovascular:     Rate and Rhythm: Normal rate and regular rhythm.     Heart sounds: Normal heart sounds.  Pulmonary:     Effort: Pulmonary effort is normal.     Breath sounds: Normal breath sounds.  Abdominal:     General: Bowel sounds are normal.     Palpations: Abdomen is soft.  Skin:    General: Skin is warm and dry.  Neurological:     Mental Status: He is alert and oriented to person, place, and time.      CMP Latest Ref Rng & Units 01/02/2020  Glucose 65 - 99 mg/dL -  BUN 6 - 24 mg/dL -  Creatinine 0.76 - 1.27 mg/dL -  Sodium 134 - 144 mmol/L -  Potassium 3.5 - 5.2 mmol/L -  Chloride 96 - 106 mmol/L -  CO2 20 - 29 mmol/L -  Calcium 8.7 - 10.2 mg/dL -  Total Protein 6.0 - 8.5 g/dL 7.1  Total Bilirubin 0.0 - 1.2 mg/dL 0.6  Alkaline Phos 48 - 121 IU/L 151(H)  AST 0 - 40 IU/L 21  ALT 0 - 44 IU/L 20   CBC Latest Ref Rng & Units  05/16/2020  WBC 4.0 - 10.5 K/uL 11.5(H)  Hemoglobin 13.0 - 17.0 g/dL 16.7  Hematocrit 39 - 52 % 49.8  Platelets 150 - 400 K/uL 357   Assessment and plan- Patient is a 54 y.o. male with secondary polycythemia here for routine follow-up for possible phlebotomy  Hematocrit currently is less than 50 and patient does not require phlebotomy at this time.  Will initiate phlebotomy if hematocrit goes to more than 55.  I will see him back in 6 months with CBC with differential and CMP   Visit Diagnosis 1. Polycythemia      Dr. Randa Evens, MD, MPH [facility] [phone] 05/21/2020 12:35 PM

## 2020-05-30 ENCOUNTER — Inpatient Hospital Stay: Payer: BC Managed Care – PPO

## 2020-06-13 ENCOUNTER — Inpatient Hospital Stay: Payer: BC Managed Care – PPO

## 2020-06-16 ENCOUNTER — Ambulatory Visit: Payer: Self-pay

## 2020-06-16 ENCOUNTER — Other Ambulatory Visit: Payer: Self-pay

## 2020-06-16 ENCOUNTER — Ambulatory Visit: Payer: Self-pay | Admitting: Internal Medicine

## 2020-06-16 ENCOUNTER — Emergency Department
Admission: EM | Admit: 2020-06-16 | Discharge: 2020-06-16 | Disposition: A | Discharge status: Home / Self Care | Payer: BC Managed Care – PPO | Attending: Emergency Medicine | Admitting: Emergency Medicine

## 2020-06-16 ENCOUNTER — Telehealth: Payer: Self-pay

## 2020-06-16 DIAGNOSIS — Y999 Unspecified external cause status: Secondary | ICD-10-CM | POA: Insufficient documentation

## 2020-06-16 DIAGNOSIS — T18128A Food in esophagus causing other injury, initial encounter: Secondary | ICD-10-CM | POA: Diagnosis present

## 2020-06-16 DIAGNOSIS — Y929 Unspecified place or not applicable: Secondary | ICD-10-CM | POA: Insufficient documentation

## 2020-06-16 DIAGNOSIS — F17210 Nicotine dependence, cigarettes, uncomplicated: Secondary | ICD-10-CM | POA: Diagnosis not present

## 2020-06-16 DIAGNOSIS — Y9389 Activity, other specified: Secondary | ICD-10-CM | POA: Diagnosis not present

## 2020-06-16 DIAGNOSIS — X58XXXA Exposure to other specified factors, initial encounter: Secondary | ICD-10-CM | POA: Diagnosis not present

## 2020-06-16 DIAGNOSIS — Z7982 Long term (current) use of aspirin: Secondary | ICD-10-CM | POA: Diagnosis not present

## 2020-06-16 DIAGNOSIS — Z20822 Contact with and (suspected) exposure to covid-19: Secondary | ICD-10-CM | POA: Diagnosis not present

## 2020-06-16 DIAGNOSIS — Z79899 Other long term (current) drug therapy: Secondary | ICD-10-CM | POA: Insufficient documentation

## 2020-06-16 DIAGNOSIS — K222 Esophageal obstruction: Secondary | ICD-10-CM

## 2020-06-16 LAB — CBC
HCT: 54.8 % — ABNORMAL HIGH (ref 39.0–52.0)
Hemoglobin: 17.8 g/dL — ABNORMAL HIGH (ref 13.0–17.0)
MCH: 26.4 pg (ref 26.0–34.0)
MCHC: 32.5 g/dL (ref 30.0–36.0)
MCV: 81.4 fL (ref 80.0–100.0)
Platelets: 366 10*3/uL (ref 150–400)
RBC: 6.73 MIL/uL — ABNORMAL HIGH (ref 4.22–5.81)
RDW: 18.4 % — ABNORMAL HIGH (ref 11.5–15.5)
WBC: 12.8 10*3/uL — ABNORMAL HIGH (ref 4.0–10.5)
nRBC: 0 % (ref 0.0–0.2)

## 2020-06-16 LAB — RESPIRATORY PANEL BY RT PCR (FLU A&B, COVID)
Influenza A by PCR: NEGATIVE
Influenza B by PCR: NEGATIVE
SARS Coronavirus 2 by RT PCR: NEGATIVE

## 2020-06-16 LAB — BASIC METABOLIC PANEL
Anion gap: 11 (ref 5–15)
BUN: 14 mg/dL (ref 6–20)
CO2: 26 mmol/L (ref 22–32)
Calcium: 9.3 mg/dL (ref 8.9–10.3)
Chloride: 103 mmol/L (ref 98–111)
Creatinine, Ser: 1.06 mg/dL (ref 0.61–1.24)
GFR, Estimated: >60 mL/min (ref >60)
Glucose, Bld: 109 mg/dL — ABNORMAL HIGH (ref 70–99)
Potassium: 4.4 mmol/L (ref 3.5–5.1)
Sodium: 140 mmol/L (ref 135–145)

## 2020-06-16 MED ORDER — GLUCAGON HCL RDNA (DIAGNOSTIC) 1 MG IJ SOLR
1.0000 mg | Freq: Once | INTRAMUSCULAR | Status: AC
  Administered 2020-06-16: 1 mg via INTRAVENOUS
  Filled 2020-06-16: qty 1

## 2020-06-16 MED ORDER — SODIUM CHLORIDE 0.9 % IV BOLUS
1000.0000 mL | Freq: Once | INTRAVENOUS | Status: AC
  Administered 2020-06-16: 1000 mL via INTRAVENOUS

## 2020-06-16 NOTE — ED Provider Notes (Signed)
[facility] Emergency Department Provider Note  Time seen: 11:30 AM  I have reviewed the triage vital signs and the nursing notes.   HISTORY  Chief Complaint Stuck food bolus   HPI Harold Carroll is a 54 y.o. male with a past medical history of gastric reflux, anxiety, presents to the emergency department for possible esophageal impaction.  According to the patient he was eating pork last night when he felt to get stuck in his throat.  States he was up to around 2:00 last night trying to vomit it back up.  States he got some of it up and thought he was better however this morning he attempted to drink coffee swallowed the coffee felt to get stuck and vomited it back up.  Has been unable to swallow liquids.  Patient states this happened approximately 5 years ago but ultimately cleared on its own, happened approximately 10 to 15 years ago as well but again cleared on its own.  Patient has never had an endoscopy.   Past Medical History:  Diagnosis Date  . GERD (gastroesophageal reflux disease)   . Panic attacks   . Polycythemia     Patient Active Problem List   Diagnosis Date Noted  . Special screening for malignant neoplasms, colon   . Polyp of sigmoid colon   . Polycythemia 02/19/2019  . Mixed hyperlipidemia 10/02/2015  . Overweight (BMI 25.0-29.9) 09/24/2015  . Tobacco use disorder 09/24/2015  . Panic disorder 09/24/2015  . FH: heart disease 09/24/2015  . FH: diabetes mellitus 09/24/2015  . GERD (gastroesophageal reflux disease) 09/24/2015  . Seasonal allergic rhinitis 09/24/2015    Past Surgical History:  Procedure Laterality Date  . COLONOSCOPY WITH PROPOFOL N/A 06/18/2019   Procedure: COLONOSCOPY WITH BIOPSY;  Surgeon: Lucilla Lame, MD;  Location: [place];  Service: Endoscopy;  Laterality: N/A;  . INGUINAL HERNIA REPAIR Left 10/28/2015   Procedure: HERNIA REPAIR INGUINAL ADULT;  Surgeon: Hubbard Robinson, MD;  Location: [facility];   Service: General;  Laterality: Left;  . POLYPECTOMY  06/18/2019   Procedure: POLYPECTOMY;  Surgeon: Lucilla Lame, MD;  Location: [place];  Service: Endoscopy;;  . SKIN SURGERY     Tumor-benign  . TONSILLECTOMY AND ADENOIDECTOMY    . TUMOR REMOVAL    . VENTRAL HERNIA REPAIR  1968   Dr. Mare Ferrari    Prior to Admission medications   Medication Sig Start Date End Date Taking? Authorizing Provider  ASPIRIN 81 PO Take by mouth daily.    [provider]  cetirizine (ZYRTEC) 10 MG tablet Take 10 mg by mouth daily.    [provider]  fluticasone (FLONASE) 50 MCG/ACT nasal spray Place 2 sprays into both nostrils daily. 04/22/17   Glean Hess, MD  LORazepam (ATIVAN) 0.5 MG tablet Take 1 tablet (0.5 mg total) by mouth daily as needed for anxiety. 07/12/19   Glean Hess, MD  Multiple Vitamin (MULTIVITAMIN) capsule Take 1 capsule by mouth daily.    [provider]  omeprazole (PRILOSEC) 10 MG capsule Take 10 mg by mouth daily.    [provider]  sildenafil (REVATIO) 20 MG tablet Take 1 tablet (20 mg total) by mouth as needed. Take 1-5 tabs as needed prior to intercourse 01/25/20   Hollice Espy, MD  simvastatin (ZOCOR) 10 MG tablet Take 1 tablet (10 mg total) by mouth at bedtime. 01/25/19   Glean Hess, MD  tamsulosin (FLOMAX) 0.4 MG CAPS capsule Take 1 capsule (0.4  mg total) by mouth daily. 02/18/20   Zara Council A, PA-C    No Known Allergies  Family History  Problem Relation Age of Onset  . Thyroid disease Mother   . Heart disease Father        heart attack  . Diabetes Father   . Lung cancer Maternal Aunt   . Cancer Maternal Aunt        lung cancer  . Heart disease Paternal Uncle   . Cancer Maternal Grandfather        lung cancer  . Prostate cancer Brother 50  . Lupus Brother     Social History Social History   Tobacco Use  . Smoking status: Current Every Day Smoker    Packs/day: 1.50    Years: 30.00    Pack  years: 45.00    Types: Cigarettes    Start date: 10/22/1983    Last attempt to quit: 10/08/2015    Years since quitting: 4.6  . Smokeless tobacco: Never Used  . Tobacco comment: off and on since 1985  Vaping Use  . Vaping Use: Never used  Substance Use Topics  . Alcohol use: Yes    Alcohol/week: 8.0 standard drinks    Types: 8 Standard drinks or equivalent per week  . Drug use: Yes    Comment: every now and then about 1 time month    Review of Systems Constitutional: Negative for fever. Cardiovascular: Negative for chest pain. Respiratory: Negative for shortness of breath. Gastrointestinal: Negative for abdominal pain.  Positive for nausea. Genitourinary: Negative for urinary compaints Musculoskeletal: Negative for musculoskeletal complaints Neurological: Negative for headache All other ROS negative  ____________________________________________   PHYSICAL EXAM:  VITAL SIGNS: ED Triage Vitals [06/16/20 1047]  Enc Vitals Group     BP (!) 139/98     Pulse Rate 82     Resp 18     Temp 97.9 F (36.6 C)     Temp Source Oral     SpO2 98 %     Weight 220 lb (99.8 kg)     Height 6\' 1"  (1.854 m)     Head Circumference      Peak Flow      Pain Score 0     Pain Loc      Pain Edu?      Excl. in [place]?    Constitutional: Alert and oriented. Well appearing and in no distress. Eyes: Normal exam ENT      Head: Normocephalic and atraumatic.      Mouth/Throat: Mucous membranes are moist. Cardiovascular: Normal rate, regular rhythm.  Respiratory: Normal respiratory effort without tachypnea nor retractions. Breath sounds are clear  Gastrointestinal: Soft and nontender. No distention.  Musculoskeletal: Nontender with normal range of motion in all extremities.  Neurologic:  Normal speech and language. No gross focal neurologic deficits  Skin:  Skin is warm, dry and intact.  Psychiatric: Mood and affect are normal.   ____________________________________________   INITIAL  IMPRESSION / ASSESSMENT AND PLAN / ED COURSE  Pertinent labs & imaging results that were available during my care of the patient were reviewed by me and considered in my medical decision making (see chart for details).   Patient presents to the emergency department for likely esophageal food bolus impaction.  Patient unable to tolerate liquids.  Will attempt glucagon in the emergency department, IV hydrate.  We will check basic labs and a Covid swab in anticipation for possible endoscopy.  If glucagon is successful I  would anticipate outpatient follow-up with GI for endoscopy.  If glucagon unsuccessful patient will likely require urgent endoscopy today.  Patient feels much better after glucagon.  No longer feels like something is stuck in his throat.  Gave the patient a cola which he was able to drink without issue.  Had the patient showed a glass of water which he was able to do without issue.  States feels like everything is passing normally.  We will discharge the patient home with GI follow-up.  Discussed return precautions.  Patient agreeable to plan of care.  Harold Carroll was evaluated in Emergency Department on 06/16/2020 for the symptoms described in the history of present illness. He was evaluated in the context of the global COVID-19 pandemic, which necessitated consideration that the patient might be at risk for infection with the SARS-CoV-2 virus that causes COVID-19. Institutional protocols and algorithms that pertain to the evaluation of patients at risk for COVID-19 are in a state of rapid change based on information released by regulatory bodies including the CDC and federal and state organizations. These policies and algorithms were followed during the patient's care in the ED.  ____________________________________________   FINAL CLINICAL IMPRESSION(S) / ED DIAGNOSES  Esophageal food bolus impaction   Harvest Dark, MD 06/16/20 1312

## 2020-06-16 NOTE — Discharge Instructions (Addendum)
Please call the number provided for GI medicine to arrange a follow-up appointment for likely endoscopy.  Return to the emergency department for any further feelings of food stuck in your esophagus or any other symptom personally concerning to yourself.  Please chew your food very well and take small bites until you have seen GI medicine.

## 2020-06-16 NOTE — Telephone Encounter (Signed)
Chassidy spoke to pt

## 2020-06-16 NOTE — ED Notes (Signed)
Given water by Dr. Kerman Passey, MD. Pt able to keep food down pt denies any trouble swallowing at this time.

## 2020-06-16 NOTE — Telephone Encounter (Signed)
   [place] Male, 54 y.o., Aug 09, 1965 MRN:  [id number] Phone:  [phone] Jerilynn Mages) PCP:  Glean Hess, MD Coverage:  [facility] 07/04/2020 at 8:40 AM Message from Oneta Rack sent at 06/16/2020 7:16 AM EST  patient had a piece of pork pieces stuck in his throat from last night seeking clinical advice, patient states he is drinking water but not loosing up the pieces seeking clinical advice. Sent a CRM to the practice for a possible work in.   Call History   Type Contact Phone User  06/16/2020 07:16 AM EST Phone (Incoming) Debow, Arrian O (Self) [phone] Lillia Abed, Tiffany M   Pt. Reports at dinner last night felt like meat "got stuck in my esophagus." Some of the food came up, but pt. Still has the sensation that "something is stuck there." Swallowing but "it feels like it gets it gets hung up." No difficulty breathing. Appointment made. Reason for Disposition . [1] Symptoms of pill stuck in throat or esophagus (e.g., pain in throat or chest, FB sensation) AND [2] no relief after using CARE ADVICE  Answer Assessment - Initial Assessment Questions 1. SYMPTOM: "Are you having difficulty swallowing liquids, solids, or both?"     Liquids 2. ONSET: "When did the swallowing problems begin?"      Last night 3. CAUSE: "What do you think is causing the problem?"      Food stuck 4. CHRONIC/RECURRENT: "Is this a new problem for you?"  If no, ask: "How long have you had this problem?" (e.g., days, weeks, months)      Happened in the past 5. OTHER SYMPTOMS: "Do you have any other symptoms?" (e.g., difficulty breathing, sore throat, swollen tongue, chest pain)     Difficulty swallowing 6. PREGNANCY: "Is there any chance you are pregnant?" "When was your last menstrual period?"     N/A  Protocols used: SWALLOWING DIFFICULTY-A-AH

## 2020-06-16 NOTE — Telephone Encounter (Signed)
Copied from [place] [id number]. Topic: General - Other >> Jun 16, 2020  7:11 AM Oneta Rack wrote: Reason for CRM:  patient had a piece of pork pieces stuck in his throat from last night seeking a work in with PCP or Dr. Ronnald Ramp, please advise

## 2020-06-16 NOTE — ED Triage Notes (Signed)
Pt here with a food bolus stuck in his throat when eating some pork last night. Pt has been attempting to clean bolus from throat but cannot keep any liquid down. Pt NAD in triage.

## 2020-06-16 NOTE — Telephone Encounter (Signed)
Patient was placed on the schedule for 1140 same day appointment. Called and spoke with patient. He said that when he tried to drink water or fluids - the water or fluids come right back up. Explained to him that he needs to go to the ER to have a scope or glucagon shot given to have the foot dislodged. He verbalized understanding and canceled his same day appt since this is a emergency.

## 2020-06-16 NOTE — ED Notes (Signed)
Dr. Kerman Passey, MD at bedside. Pt states he feels better at this time. Warm coke given to pt at this time.

## 2020-06-27 ENCOUNTER — Inpatient Hospital Stay: Payer: BC Managed Care – PPO

## 2020-06-27 ENCOUNTER — Encounter: Payer: BC Managed Care – PPO | Admitting: Internal Medicine

## 2020-07-04 ENCOUNTER — Ambulatory Visit (INDEPENDENT_AMBULATORY_CARE_PROVIDER_SITE_OTHER): Payer: BC Managed Care – PPO | Admitting: Internal Medicine

## 2020-07-04 ENCOUNTER — Encounter: Payer: Self-pay | Admitting: Internal Medicine

## 2020-07-04 ENCOUNTER — Other Ambulatory Visit: Payer: Self-pay

## 2020-07-04 VITALS — BP 126/88 | HR 73 | Temp 98.0°F | Ht 72.0 in | Wt 222.0 lb

## 2020-07-04 DIAGNOSIS — Z1159 Encounter for screening for other viral diseases: Secondary | ICD-10-CM | POA: Diagnosis not present

## 2020-07-04 DIAGNOSIS — F410 Panic disorder [episodic paroxysmal anxiety] without agoraphobia: Secondary | ICD-10-CM

## 2020-07-04 DIAGNOSIS — R1319 Other dysphagia: Secondary | ICD-10-CM | POA: Diagnosis not present

## 2020-07-04 DIAGNOSIS — F17200 Nicotine dependence, unspecified, uncomplicated: Secondary | ICD-10-CM | POA: Diagnosis not present

## 2020-07-04 DIAGNOSIS — Z0000 Encounter for general adult medical examination without abnormal findings: Secondary | ICD-10-CM | POA: Diagnosis not present

## 2020-07-04 DIAGNOSIS — E782 Mixed hyperlipidemia: Secondary | ICD-10-CM | POA: Diagnosis not present

## 2020-07-04 DIAGNOSIS — R399 Unspecified symptoms and signs involving the genitourinary system: Secondary | ICD-10-CM

## 2020-07-04 LAB — POCT URINALYSIS DIPSTICK
Bilirubin, UA: NEGATIVE
Glucose, UA: NEGATIVE
Ketones, UA: NEGATIVE
Nitrite, UA: POSITIVE
Protein, UA: NEGATIVE
Spec Grav, UA: 1.02 (ref 1.010–1.025)
Urobilinogen, UA: 0.2 E.U./dL
pH, UA: 6 (ref 5.0–8.0)

## 2020-07-04 MED ORDER — LORAZEPAM 0.5 MG PO TABS: 0.5000 mg | ORAL_TABLET | Freq: Every day | ORAL | 0 refills | Status: DC | PRN

## 2020-07-04 MED ORDER — SIMVASTATIN 10 MG PO TABS: 10.0000 mg | ORAL_TABLET | Freq: Every day | ORAL | 3 refills | Status: DC

## 2020-07-04 NOTE — Patient Instructions (Signed)
Consider getting the Shringrix vaccines.

## 2020-07-04 NOTE — Progress Notes (Signed)
Date:  07/04/2020   Name:  Harold Carroll   DOB:  13-Apr-1966   MRN:  [id number]   Chief Complaint: Annual Exam  Harold Carroll is a 54 y.o. male who presents today for his Complete Annual Exam. He feels well. He reports exercising none. He reports he is sleeping well.   Colonoscopy: 06/2019  Immunization History  Administered Date(s) Administered  . Influenza-Unspecified 05/16/2018, 05/23/2019, 05/08/2020  . PFIZER SARS-COV-2 Vaccination 06/04/2020, 06/24/2020  . Tdap 09/24/2015  Shingrix vaccine discussed.    Hyperlipidemia This is a chronic problem. The problem is controlled. Pertinent negatives include no myalgias or shortness of breath. Current antihyperlipidemic treatment includes statins. The current treatment provides significant improvement of lipids. There are no compliance problems.   Benign Prostatic Hypertrophy This is a new problem. The problem has been gradually improving since onset. Irritative symptoms do not include frequency or urgency. Pertinent negatives include no chills, dysuria or hematuria. Past treatments include tamsulosin (seen by Urology).  Esophageal food impaction - he went to the ED several months ago with a food impaction.  It was treated successfully with glucagon.  He continues on PPI daily but needs to see GI.  It has not recurred - he takes smaller bites and chews thoroughly.  Lab Results  Component Value Date   CREATININE 1.06 06/16/2020   BUN 14 06/16/2020   NA 140 06/16/2020   K 4.4 06/16/2020   CL 103 06/16/2020   CO2 26 06/16/2020   Lab Results  Component Value Date   CHOL 236 (H) 01/24/2019   HDL 40 01/24/2019   LDLCALC 159 (H) 01/24/2019   TRIG 186 (H) 01/24/2019   CHOLHDL 5.9 (H) 01/24/2019   Lab Results  Component Value Date   TSH 1.240 09/29/2015   Lab Results  Component Value Date   HGBA1C 5.6 09/29/2015   Lab Results  Component Value Date   WBC 12.8 (H) 06/16/2020   HGB 17.8 (H) 06/16/2020   HCT 54.8  (H) 06/16/2020   MCV 81.4 06/16/2020   PLT 366 06/16/2020   Lab Results  Component Value Date   ALT 20 01/02/2020   AST 21 01/02/2020   ALKPHOS 151 (H) 01/02/2020   BILITOT 0.6 01/02/2020   Lab Results  Component Value Date   PSA1 0.7 01/24/2019   PSA1 1.1 11/21/2017      Review of Systems  Constitutional: Negative for appetite change, chills, diaphoresis, fatigue and unexpected weight change.  HENT: Positive for hearing loss and trouble swallowing (one episode ). Negative for tinnitus and voice change.   Eyes: Negative for visual disturbance.  Respiratory: Negative for chest tightness, shortness of breath and wheezing.   Cardiovascular: Negative for palpitations and leg swelling.  Gastrointestinal: Negative for blood in stool, constipation and diarrhea.  Genitourinary: Negative for difficulty urinating, dysuria, frequency, hematuria and urgency.       Urine odor  Musculoskeletal: Negative for arthralgias, back pain and myalgias.  Skin: Negative for color change and rash.  Neurological: Negative for dizziness, syncope and headaches.  Hematological: Negative for adenopathy.  Psychiatric/Behavioral: Negative for dysphoric mood and sleep disturbance. The patient is nervous/anxious (intermittent anxiety attacks).     Patient Active Problem List   Diagnosis Date Noted  . Special screening for malignant neoplasms, colon   . Polyp of sigmoid colon   . Polycythemia 02/19/2019  . Mixed hyperlipidemia 10/02/2015  . Overweight (BMI 25.0-29.9) 09/24/2015  . Tobacco use disorder 09/24/2015  . Panic disorder 09/24/2015  .  FH: heart disease 09/24/2015  . FH: diabetes mellitus 09/24/2015  . GERD (gastroesophageal reflux disease) 09/24/2015  . Seasonal allergic rhinitis 09/24/2015    Allergies  Allergen Reactions  . Gramineae Pollens     Past Surgical History:  Procedure Laterality Date  . COLONOSCOPY WITH PROPOFOL N/A 06/18/2019   Procedure: COLONOSCOPY WITH BIOPSY;   Surgeon: Lucilla Lame, MD;  Location: [place];  Service: Endoscopy;  Laterality: N/A;  . INGUINAL HERNIA REPAIR Left 10/28/2015   Procedure: HERNIA REPAIR INGUINAL ADULT;  Surgeon: Hubbard Robinson, MD;  Location: [facility];  Service: General;  Laterality: Left;  . POLYPECTOMY  06/18/2019   Procedure: POLYPECTOMY;  Surgeon: Lucilla Lame, MD;  Location: [place];  Service: Endoscopy;;  . SKIN SURGERY     Tumor-benign  . TONSILLECTOMY AND ADENOIDECTOMY    . TUMOR REMOVAL    . VENTRAL HERNIA REPAIR  1968   Dr. Mare Ferrari    Social History   Tobacco Use  . Smoking status: Current Every Day Smoker    Packs/day: 1.50    Years: 30.00    Pack years: 45.00    Types: Cigarettes    Start date: 10/22/1983    Last attempt to quit: 10/08/2015    Years since quitting: 4.7  . Smokeless tobacco: Never Used  . Tobacco comment: off and on since 1985  Vaping Use  . Vaping Use: Never used  Substance Use Topics  . Alcohol use: Yes    Alcohol/week: 8.0 standard drinks    Types: 8 Standard drinks or equivalent per week  . Drug use: Yes    Comment: every now and then about 1 time month     Medication list has been reviewed and updated.  Current Meds  Medication Sig  . ASPIRIN 81 PO Take by mouth daily.  . cetirizine (ZYRTEC) 10 MG tablet Take 10 mg by mouth daily.  . fluticasone (FLONASE) 50 MCG/ACT nasal spray Place 2 sprays into both nostrils daily.  Marland Kitchen LORazepam (ATIVAN) 0.5 MG tablet Take 1 tablet (0.5 mg total) by mouth daily as needed for anxiety.  . Multiple Vitamin (MULTIVITAMIN) capsule Take 1 capsule by mouth daily.  Marland Kitchen omeprazole (PRILOSEC) 10 MG capsule Take 10 mg by mouth daily.  . sildenafil (REVATIO) 20 MG tablet Take 1 tablet (20 mg total) by mouth as needed. Take 1-5 tabs as needed prior to intercourse  . simvastatin (ZOCOR) 10 MG tablet Take 1 tablet (10 mg total) by mouth at bedtime.  . tamsulosin (FLOMAX) 0.4 MG CAPS capsule Take 1 capsule (0.4 mg total) by  mouth daily.    PHQ 2/9 Scores 07/04/2020 01/14/2020 01/02/2020 07/12/2019  PHQ - 2 Score 0 0 0 0  PHQ- 9 Score 0 0 0 -    GAD 7 : Generalized Anxiety Score 07/04/2020 01/14/2020 01/02/2020 07/12/2019  Nervous, Anxious, on Edge 0 0 0 0  Control/stop worrying 0 0 0 0  Worry too much - different things 0 0 0 0  Trouble relaxing 0 0 0 0  Restless 0 0 0 0  Easily annoyed or irritable 0 0 0 0  Afraid - awful might happen 0 0 0 0  Total GAD 7 Score 0 0 0 0  Anxiety Difficulty - Not difficult at all Not difficult at all Not difficult at all    BP Readings from Last 3 Encounters:  07/04/20 126/88  06/16/20 134/82  05/16/20 130/87    Physical Exam Vitals and nursing note reviewed.  Constitutional:  Appearance: Normal appearance. He is well-developed.  HENT:     Head: Normocephalic.     Right Ear: Tympanic membrane, ear canal and external ear normal.     Left Ear: External ear normal. There is impacted cerumen.     Nose: Nose normal.  Eyes:     Conjunctiva/sclera: Conjunctivae normal.     Pupils: Pupils are equal, round, and reactive to light.  Neck:     Thyroid: No thyromegaly.     Vascular: No carotid bruit.  Cardiovascular:     Rate and Rhythm: Normal rate and regular rhythm.     Heart sounds: Normal heart sounds.  Pulmonary:     Effort: Pulmonary effort is normal.     Breath sounds: Normal breath sounds. No wheezing.  Chest:     Breasts:        Right: No mass.        Left: No mass.  Abdominal:     General: Bowel sounds are normal.     Palpations: Abdomen is soft.     Tenderness: There is no abdominal tenderness.  Musculoskeletal:        General: Normal range of motion.     Cervical back: Normal range of motion and neck supple.     Right lower leg: No edema.     Left lower leg: No edema.  Lymphadenopathy:     Cervical: No cervical adenopathy.  Skin:    General: Skin is warm and dry.     Capillary Refill: Capillary refill takes less than 2 seconds.  Neurological:       General: No focal deficit present.     Mental Status: He is alert and oriented to person, place, and time.     Deep Tendon Reflexes: Reflexes are normal and symmetric.  Psychiatric:        Attention and Perception: Attention normal.        Mood and Affect: Mood normal.        Speech: Speech normal.     Wt Readings from Last 3 Encounters:  07/04/20 222 lb (100.7 kg)  06/16/20 220 lb (99.8 kg)  05/16/20 220 lb (99.8 kg)    BP 126/88   Pulse 73   Temp 98 F (36.7 C) (Oral)   Ht 6' (1.829 m)   Wt 222 lb (100.7 kg)   SpO2 96%   BMI 30.11 kg/m   Assessment and Plan: 1. Annual physical exam Normal exam Continue healthy diet; recommend regular exercise - Comprehensive metabolic panel - POCT urinalysis dipstick  2. Mixed hyperlipidemia On statin therapy without side effects or other concerns - Lipid panel - simvastatin (ZOCOR) 10 MG tablet; Take 1 tablet (10 mg total) by mouth at bedtime.  Dispense: 90 tablet; Refill: 3  3. Tobacco use disorder Refer for LDCT  4. Need for hepatitis C screening test - Hepatitis C antibody  5. Panic disorder Using lorazepam rarely - due for refill - LORazepam (ATIVAN) 0.5 MG tablet; Take 1 tablet (0.5 mg total) by mouth daily as needed for anxiety.  Dispense: 10 tablet; Refill: 0  6. Esophageal dysphagia Continue daily omeprazole but needs further evaluation - Ambulatory referral to Gastroenterology  7. Lower urinary tract symptoms Followed by Urology PSA normal Urine foul - culture sent On Tamsulosin with some benefit   Partially dictated using Dragon software. Any errors are unintentional.  Halina Maidens, MD [facility]  07/04/2020

## 2020-07-05 LAB — COMPREHENSIVE METABOLIC PANEL
ALT: 18 IU/L (ref 0–44)
AST: 20 IU/L (ref 0–40)
Albumin/Globulin Ratio: 1.6 (ref 1.2–2.2)
Albumin: 4.3 g/dL (ref 3.8–4.9)
Alkaline Phosphatase: 147 IU/L — ABNORMAL HIGH (ref 44–121)
BUN/Creatinine Ratio: 9 (ref 9–20)
BUN: 9 mg/dL (ref 6–24)
Bilirubin Total: 0.6 mg/dL (ref 0.0–1.2)
CO2: 22 mmol/L (ref 20–29)
Calcium: 9.3 mg/dL (ref 8.7–10.2)
Chloride: 101 mmol/L (ref 96–106)
Creatinine, Ser: 1 mg/dL (ref 0.76–1.27)
GFR calc Af Amer: 98 mL/min/{1.73_m2} (ref >59)
GFR calc non Af Amer: 85 mL/min/{1.73_m2} (ref >59)
Globulin, Total: 2.7 g/dL (ref 1.5–4.5)
Glucose: 85 mg/dL (ref 65–99)
Potassium: 4.6 mmol/L (ref 3.5–5.2)
Sodium: 139 mmol/L (ref 134–144)
Total Protein: 7 g/dL (ref 6.0–8.5)

## 2020-07-05 LAB — LIPID PANEL
Chol/HDL Ratio: 4.2 ratio (ref 0.0–5.0)
Cholesterol, Total: 191 mg/dL (ref 100–199)
HDL: 46 mg/dL (ref >39)
LDL Chol Calc (NIH): 120 mg/dL — ABNORMAL HIGH (ref 0–99)
Triglycerides: 140 mg/dL (ref 0–149)
VLDL Cholesterol Cal: 25 mg/dL (ref 5–40)

## 2020-07-05 LAB — HEPATITIS C ANTIBODY: Hep C Virus Ab: <0.1 s/co ratio (ref 0.0–0.9)

## 2020-07-11 ENCOUNTER — Encounter: Payer: Self-pay | Admitting: *Deleted

## 2020-07-11 ENCOUNTER — Telehealth: Payer: Self-pay | Admitting: *Deleted

## 2020-07-11 NOTE — Telephone Encounter (Signed)
Received referral for low dose lung cancer screening CT scan. Message left at phone number listed in EMR for patient to call me back to facilitate scheduling scan.  

## 2020-07-15 ENCOUNTER — Other Ambulatory Visit: Payer: Self-pay | Admitting: Internal Medicine

## 2020-07-15 ENCOUNTER — Telehealth: Payer: Self-pay

## 2020-07-15 DIAGNOSIS — B9620 Unspecified Escherichia coli [E. coli] as the cause of diseases classified elsewhere: Secondary | ICD-10-CM

## 2020-07-15 MED ORDER — CEFUROXIME AXETIL 500 MG PO TABS: 500.0000 mg | ORAL_TABLET | Freq: Two times a day (BID) | ORAL | 0 refills | Status: AC

## 2020-07-15 NOTE — Telephone Encounter (Signed)
Called pt with urine culture results. Pt positive for E. Coli. Rx was sent in pt Is to take 1 tablet twice a day for 7 days. Pt verbalized understanding.  KP

## 2020-07-22 NOTE — Telephone Encounter (Signed)
Received referral for low dose lung cancer screening CT scan. Message left at phone number listed in EMR for patient to call me back to facilitate scheduling scan.  

## 2020-08-02 HISTORY — PX: ROBOT ASSISTED LAPAROSCOPIC PARTIAL COLECTOMY: SHX6476

## 2020-08-06 ENCOUNTER — Encounter: Payer: Self-pay | Admitting: Internal Medicine

## 2020-08-07 ENCOUNTER — Ambulatory Visit: Payer: Self-pay | Admitting: Family Medicine

## 2020-08-08 ENCOUNTER — Encounter: Payer: Self-pay | Admitting: Family Medicine

## 2020-08-08 ENCOUNTER — Ambulatory Visit (INDEPENDENT_AMBULATORY_CARE_PROVIDER_SITE_OTHER): Payer: BC Managed Care – PPO | Admitting: Family Medicine

## 2020-08-08 ENCOUNTER — Other Ambulatory Visit: Payer: Self-pay

## 2020-08-08 VITALS — BP 138/88 | HR 72 | Ht 72.0 in | Wt 219.0 lb

## 2020-08-08 DIAGNOSIS — R310 Gross hematuria: Secondary | ICD-10-CM

## 2020-08-08 LAB — POCT URINALYSIS DIPSTICK
Bilirubin, UA: NEGATIVE
Glucose, UA: NEGATIVE
Ketones, UA: NEGATIVE
Nitrite, UA: POSITIVE
Protein, UA: NEGATIVE
Spec Grav, UA: <=1.005 — AB (ref 1.010–1.025)
Urobilinogen, UA: 0.2 E.U./dL
pH, UA: 6 (ref 5.0–8.0)

## 2020-08-08 MED ORDER — CEFUROXIME AXETIL 500 MG PO TABS: 500.0000 mg | ORAL_TABLET | Freq: Two times a day (BID) | ORAL | 0 refills | Status: DC

## 2020-08-08 NOTE — Progress Notes (Signed)
Date:  08/08/2020   Name:  Harold Carroll   DOB:  Oct 25, 1965   MRN:  [id number]   Chief Complaint: Hematuria (Had a UTI with culture of E-Coli 12/14- treated with Ceftin twice a day x 7 days 500mg )  Hematuria This is a recurrent problem. The current episode started 1 to 4 weeks ago. The problem has been waxing and waning since onset. He describes the hematuria as gross hematuria. He reports clotting at the beginning of his urine stream. He is experiencing no pain. He describes his urine color as light pink. Irritative symptoms do not include frequency, nocturia or urgency. Obstructive symptoms include dribbling, a slower stream and a weak stream. Obstructive symptoms do not include an intermittent stream or straining. Pertinent negatives include no abdominal pain, chills, dysuria, fever or nausea.  Male GU Problem Pertinent negatives include no abdominal pain, chest pain, chills, constipation, coughing, diarrhea, dysuria, fever, frequency, headaches, nausea, rash, shortness of breath, sore throat or urgency.    Lab Results  Component Value Date   CREATININE 1.00 07/04/2020   BUN 9 07/04/2020   NA 139 07/04/2020   K 4.6 07/04/2020   CL 101 07/04/2020   CO2 22 07/04/2020   Lab Results  Component Value Date   CHOL 191 07/04/2020   HDL 46 07/04/2020   LDLCALC 120 (H) 07/04/2020   TRIG 140 07/04/2020   CHOLHDL 4.2 07/04/2020   Lab Results  Component Value Date   TSH 1.240 09/29/2015   Lab Results  Component Value Date   HGBA1C 5.6 09/29/2015   Lab Results  Component Value Date   WBC 12.8 (H) 06/16/2020   HGB 17.8 (H) 06/16/2020   HCT 54.8 (H) 06/16/2020   MCV 81.4 06/16/2020   PLT 366 06/16/2020   Lab Results  Component Value Date   ALT 18 07/04/2020   AST 20 07/04/2020   ALKPHOS 147 (H) 07/04/2020   BILITOT 0.6 07/04/2020     Review of Systems  Constitutional: Negative for chills and fever.  HENT: Negative for drooling, ear discharge, ear pain and sore  throat.   Respiratory: Negative for cough, shortness of breath and wheezing.   Cardiovascular: Negative for chest pain, palpitations and leg swelling.  Gastrointestinal: Negative for abdominal pain, blood in stool, constipation, diarrhea and nausea.  Endocrine: Negative for polydipsia.  Genitourinary: Positive for hematuria. Negative for dysuria, frequency, nocturia and urgency.  Musculoskeletal: Negative for back pain, myalgias and neck pain.  Skin: Negative for rash.  Allergic/Immunologic: Negative for environmental allergies.  Neurological: Negative for dizziness and headaches.  Hematological: Does not bruise/bleed easily.  Psychiatric/Behavioral: Negative for suicidal ideas. The patient is not nervous/anxious.     Patient Active Problem List   Diagnosis Date Noted  . Special screening for malignant neoplasms, colon   . Polyp of sigmoid colon   . Polycythemia 02/19/2019  . Mixed hyperlipidemia 10/02/2015  . Overweight (BMI 25.0-29.9) 09/24/2015  . Tobacco use disorder 09/24/2015  . Panic disorder 09/24/2015  . FH: heart disease 09/24/2015  . FH: diabetes mellitus 09/24/2015  . GERD (gastroesophageal reflux disease) 09/24/2015  . Seasonal allergic rhinitis 09/24/2015    Allergies  Allergen Reactions  . Gramineae Pollens     Past Surgical History:  Procedure Laterality Date  . COLONOSCOPY WITH PROPOFOL N/A 06/18/2019   Procedure: COLONOSCOPY WITH BIOPSY;  Surgeon: Lucilla Lame, MD;  Location: [place];  Service: Endoscopy;  Laterality: N/A;  . INGUINAL HERNIA REPAIR Left 10/28/2015   Procedure: HERNIA  REPAIR INGUINAL ADULT;  Surgeon: Hubbard Robinson, MD;  Location: [facility];  Service: General;  Laterality: Left;  . POLYPECTOMY  06/18/2019   Procedure: POLYPECTOMY;  Surgeon: Lucilla Lame, MD;  Location: [place];  Service: Endoscopy;;  . SKIN SURGERY     Tumor-benign  . TONSILLECTOMY AND ADENOIDECTOMY    . TUMOR REMOVAL    . VENTRAL HERNIA REPAIR   1968   Dr. Mare Ferrari    Social History   Tobacco Use  . Smoking status: Current Every Day Smoker    Packs/day: 1.50    Years: 30.00    Pack years: 45.00    Types: Cigarettes    Start date: 10/22/1983    Last attempt to quit: 10/08/2015    Years since quitting: 4.8  . Smokeless tobacco: Never Used  . Tobacco comment: off and on since 1985  Vaping Use  . Vaping Use: Never used  Substance Use Topics  . Alcohol use: Yes    Alcohol/week: 8.0 standard drinks    Types: 8 Standard drinks or equivalent per week  . Drug use: Yes    Comment: every now and then about 1 time month     Medication list has been reviewed and updated.  Current Meds  Medication Sig  . ASPIRIN 81 PO Take by mouth daily.  . cetirizine (ZYRTEC) 10 MG tablet Take 10 mg by mouth daily.  . fluticasone (FLONASE) 50 MCG/ACT nasal spray Place 2 sprays into both nostrils daily.  Marland Kitchen LORazepam (ATIVAN) 0.5 MG tablet Take 1 tablet (0.5 mg total) by mouth daily as needed for anxiety.  . Multiple Vitamin (MULTIVITAMIN) capsule Take 1 capsule by mouth daily.  Marland Kitchen omeprazole (PRILOSEC) 10 MG capsule Take 10 mg by mouth daily.  . sildenafil (REVATIO) 20 MG tablet Take 1 tablet (20 mg total) by mouth as needed. Take 1-5 tabs as needed prior to intercourse  . simvastatin (ZOCOR) 10 MG tablet Take 1 tablet (10 mg total) by mouth at bedtime.  . tamsulosin (FLOMAX) 0.4 MG CAPS capsule Take 1 capsule (0.4 mg total) by mouth daily.    PHQ 2/9 Scores 07/04/2020 01/14/2020 01/02/2020 07/12/2019  PHQ - 2 Score 0 0 0 0  PHQ- 9 Score 0 0 0 -    GAD 7 : Generalized Anxiety Score 07/04/2020 01/14/2020 01/02/2020 07/12/2019  Nervous, Anxious, on Edge 0 0 0 0  Control/stop worrying 0 0 0 0  Worry too much - different things 0 0 0 0  Trouble relaxing 0 0 0 0  Restless 0 0 0 0  Easily annoyed or irritable 0 0 0 0  Afraid - awful might happen 0 0 0 0  Total GAD 7 Score 0 0 0 0  Anxiety Difficulty - Not difficult at all Not difficult at all Not  difficult at all    BP Readings from Last 3 Encounters:  08/08/20 138/88  07/04/20 126/88  06/16/20 134/82    Physical Exam Vitals and nursing note reviewed.  HENT:     Head: Normocephalic.     Right Ear: Tympanic membrane, ear canal and external ear normal.     Left Ear: Tympanic membrane, ear canal and external ear normal.     Nose: Nose normal. No congestion or rhinorrhea.     Mouth/Throat:     Mouth: Oropharynx is clear and moist.  Eyes:     General: No scleral icterus.       Right eye: No discharge.  Left eye: No discharge.     Extraocular Movements: EOM normal.     Conjunctiva/sclera: Conjunctivae normal.     Pupils: Pupils are equal, round, and reactive to light.  Neck:     Thyroid: No thyromegaly.     Vascular: No JVD.     Trachea: No tracheal deviation.  Cardiovascular:     Rate and Rhythm: Normal rate and regular rhythm.     Pulses: Intact distal pulses.     Heart sounds: Normal heart sounds. No murmur heard. No friction rub. No gallop.   Pulmonary:     Effort: No respiratory distress.     Breath sounds: Normal breath sounds. No wheezing or rales.  Abdominal:     General: Bowel sounds are normal.     Palpations: Abdomen is soft. There is no hepatosplenomegaly or mass.     Tenderness: There is no abdominal tenderness. There is no CVA tenderness, guarding or rebound.     Hernia: There is no hernia in the left inguinal area or right inguinal area.  Genitourinary:    Penis: Normal.      Prostate: Normal. Not enlarged, not tender and no nodules present.     Rectum: Normal. No mass.  Musculoskeletal:        General: No tenderness or edema. Normal range of motion.     Cervical back: Normal range of motion and neck supple.  Lymphadenopathy:     Cervical: No cervical adenopathy.     Lower Body: No right inguinal adenopathy. No left inguinal adenopathy.  Skin:    General: Skin is warm.     Findings: No rash.  Neurological:     Mental Status: He is alert  and oriented to person, place, and time.     Cranial Nerves: No cranial nerve deficit.     Deep Tendon Reflexes: Strength normal and reflexes are normal and symmetric.     Wt Readings from Last 3 Encounters:  08/08/20 219 lb (99.3 kg)  07/04/20 222 lb (100.7 kg)  06/16/20 220 lb (99.8 kg)    BP 138/88   Pulse 72   Ht 6' (1.829 m)   Wt 219 lb (99.3 kg)   BMI 29.70 kg/m   Assessment and Plan: 1. Gross hematuria Relatively new onset.  Episodic.  Patient noted hematuria at the beginning of urination which cleared further into the urination.  This is been evaluated earlier and treated with Ceftin 500 mg twice a day.  Examination of the urine did note a significant amount of RBCs.  Patient has been treated again with Ceftin 500 mg twice a day but given extended dosing in the event that the prostate is involved.  Exam of the prostate by DRE was unremarkable for size shape consistency or tenderness.  Patient has been instructed that he may need to return to urology that there is further evaluations that they may need to do such as cystoscopy or other imaging measures.  Urine culture was sent in case there has been a selection of a antibiotic resistant organism from the previous treatment. - POCT urinalysis dipstick - Urine Culture

## 2020-08-11 LAB — URINE CULTURE

## 2020-08-15 ENCOUNTER — Other Ambulatory Visit: Payer: Self-pay | Admitting: Family Medicine

## 2020-08-15 MED ORDER — TAMSULOSIN HCL 0.4 MG PO CAPS
0.4000 mg | ORAL_CAPSULE | Freq: Every day | ORAL | 1 refills | Status: DC
Start: 2020-08-15 — End: 2021-03-11

## 2020-08-25 ENCOUNTER — Ambulatory Visit: Payer: Self-pay | Admitting: Internal Medicine

## 2020-09-08 ENCOUNTER — Ambulatory Visit: Payer: BC Managed Care – PPO | Admitting: Gastroenterology

## 2020-09-22 ENCOUNTER — Ambulatory Visit: Payer: BC Managed Care – PPO | Admitting: Internal Medicine

## 2020-09-22 ENCOUNTER — Encounter: Payer: Self-pay | Admitting: Internal Medicine

## 2020-09-22 ENCOUNTER — Other Ambulatory Visit: Payer: Self-pay | Admitting: Internal Medicine

## 2020-09-22 ENCOUNTER — Other Ambulatory Visit: Payer: Self-pay

## 2020-09-22 VITALS — BP 128/80 | HR 90 | Ht 72.0 in | Wt 222.0 lb

## 2020-09-22 DIAGNOSIS — N390 Urinary tract infection, site not specified: Secondary | ICD-10-CM | POA: Diagnosis not present

## 2020-09-22 DIAGNOSIS — R310 Gross hematuria: Secondary | ICD-10-CM

## 2020-09-22 LAB — POCT URINALYSIS DIPSTICK
Bilirubin, UA: NEGATIVE
Glucose, UA: NEGATIVE
Ketones, UA: NEGATIVE
Nitrite, UA: POSITIVE
Protein, UA: POSITIVE — AB
Spec Grav, UA: 1.025 (ref 1.010–1.025)
Urobilinogen, UA: 0.2 E.U./dL
pH, UA: 6 (ref 5.0–8.0)

## 2020-09-22 NOTE — Progress Notes (Signed)
Date:  09/22/2020   Name:  Harold Carroll   DOB:  1965/12/29   MRN:  [id number]   Chief Complaint: Hematuria (Follow up.)  Hematuria This is a recurrent problem. The current episode started more than 1 month ago. He describes the hematuria as gross hematuria. He reports no clotting in his urine stream. He is experiencing no pain. He describes his urine color as tea-colored. Irritative symptoms do not include urgency. Obstructive symptoms include incomplete emptying. Pertinent negatives include no chills, dysuria, fever, nausea or vomiting. He is sexually active. His past medical history is significant for BPH and recent infection. There is no history of kidney stones or prostatitis.    Lab Results  Component Value Date   CREATININE 1.00 07/04/2020   BUN 9 07/04/2020   NA 139 07/04/2020   K 4.6 07/04/2020   CL 101 07/04/2020   CO2 22 07/04/2020   Lab Results  Component Value Date   CHOL 191 07/04/2020   HDL 46 07/04/2020   LDLCALC 120 (H) 07/04/2020   TRIG 140 07/04/2020   CHOLHDL 4.2 07/04/2020   Lab Results  Component Value Date   TSH 1.240 09/29/2015   Lab Results  Component Value Date   HGBA1C 5.6 09/29/2015   Lab Results  Component Value Date   WBC 12.8 (H) 06/16/2020   HGB 17.8 (H) 06/16/2020   HCT 54.8 (H) 06/16/2020   MCV 81.4 06/16/2020   PLT 366 06/16/2020   Lab Results  Component Value Date   ALT 18 07/04/2020   AST 20 07/04/2020   ALKPHOS 147 (H) 07/04/2020   BILITOT 0.6 07/04/2020     Review of Systems  Constitutional: Negative for chills, fatigue and fever.  Respiratory: Negative for cough, chest tightness and shortness of breath.   Gastrointestinal: Negative for nausea and vomiting.  Genitourinary: Positive for hematuria and incomplete emptying. Negative for difficulty urinating, dysuria and urgency.    Patient Active Problem List   Diagnosis Date Noted  . Special screening for malignant neoplasms, colon   . Polyp of sigmoid colon    . Polycythemia 02/19/2019  . Mixed hyperlipidemia 10/02/2015  . Overweight (BMI 25.0-29.9) 09/24/2015  . Tobacco use disorder 09/24/2015  . Panic disorder 09/24/2015  . FH: heart disease 09/24/2015  . FH: diabetes mellitus 09/24/2015  . GERD (gastroesophageal reflux disease) 09/24/2015  . Seasonal allergic rhinitis 09/24/2015    Allergies  Allergen Reactions  . Gramineae Pollens     Past Surgical History:  Procedure Laterality Date  . COLONOSCOPY WITH PROPOFOL N/A 06/18/2019   Procedure: COLONOSCOPY WITH BIOPSY;  Surgeon: Lucilla Lame, MD;  Location: [place];  Service: Endoscopy;  Laterality: N/A;  . INGUINAL HERNIA REPAIR Left 10/28/2015   Procedure: HERNIA REPAIR INGUINAL ADULT;  Surgeon: Hubbard Robinson, MD;  Location: [facility];  Service: General;  Laterality: Left;  . POLYPECTOMY  06/18/2019   Procedure: POLYPECTOMY;  Surgeon: Lucilla Lame, MD;  Location: [place];  Service: Endoscopy;;  . SKIN SURGERY     Tumor-benign  . TONSILLECTOMY AND ADENOIDECTOMY    . TUMOR REMOVAL    . VENTRAL HERNIA REPAIR  1968   Dr. Mare Ferrari    Social History   Tobacco Use  . Smoking status: Current Every Day Smoker    Packs/day: 1.50    Years: 30.00    Pack years: 45.00    Types: Cigarettes    Start date: 10/22/1983    Last attempt to quit: 10/08/2015  Years since quitting: 4.9  . Smokeless tobacco: Never Used  . Tobacco comment: off and on since 1985  Vaping Use  . Vaping Use: Never used  Substance Use Topics  . Alcohol use: Yes    Alcohol/week: 8.0 standard drinks    Types: 8 Standard drinks or equivalent per week  . Drug use: Yes    Comment: every now and then about 1 time month     Medication list has been reviewed and updated.  Current Meds  Medication Sig  . ASPIRIN 81 PO Take by mouth daily.  . cetirizine (ZYRTEC) 10 MG tablet Take 10 mg by mouth daily.  . fluticasone (FLONASE) 50 MCG/ACT nasal spray Place 2 sprays into both nostrils daily.   Marland Kitchen LORazepam (ATIVAN) 0.5 MG tablet Take 1 tablet (0.5 mg total) by mouth daily as needed for anxiety.  . Multiple Vitamin (MULTIVITAMIN) capsule Take 1 capsule by mouth daily.  Marland Kitchen omeprazole (PRILOSEC) 10 MG capsule Take 10 mg by mouth daily.  . sildenafil (REVATIO) 20 MG tablet Take 1 tablet (20 mg total) by mouth as needed. Take 1-5 tabs as needed prior to intercourse  . simvastatin (ZOCOR) 10 MG tablet Take 1 tablet (10 mg total) by mouth at bedtime.  . tamsulosin (FLOMAX) 0.4 MG CAPS capsule Take 1 capsule (0.4 mg total) by mouth daily.    PHQ 2/9 Scores 09/22/2020 07/04/2020 01/14/2020 01/02/2020  PHQ - 2 Score 0 0 0 0  PHQ- 9 Score 0 0 0 0    GAD 7 : Generalized Anxiety Score 09/22/2020 07/04/2020 01/14/2020 01/02/2020  Nervous, Anxious, on Edge 0 0 0 0  Control/stop worrying 0 0 0 0  Worry too much - different things 0 0 0 0  Trouble relaxing 0 0 0 0  Restless 0 0 0 0  Easily annoyed or irritable 0 0 0 0  Afraid - awful might happen 0 0 0 0  Total GAD 7 Score 0 0 0 0  Anxiety Difficulty Not difficult at all - Not difficult at all Not difficult at all    BP Readings from Last 3 Encounters:  09/22/20 128/80  08/08/20 138/88  07/04/20 126/88    Physical Exam Vitals and nursing note reviewed.  Constitutional:      General: He is not in acute distress.    Appearance: He is well-developed.  HENT:     Head: Normocephalic and atraumatic.  Cardiovascular:     Rate and Rhythm: Normal rate and regular rhythm.     Pulses: Normal pulses.  Pulmonary:     Effort: Pulmonary effort is normal. No respiratory distress.  Abdominal:     General: Abdomen is flat.     Palpations: Abdomen is soft.     Tenderness: There is no abdominal tenderness.  Musculoskeletal:     Cervical back: Normal range of motion.  Skin:    General: Skin is warm and dry.     Findings: No rash.  Neurological:     Mental Status: He is alert and oriented to person, place, and time.  Psychiatric:        Mood and  Affect: Mood normal.        Behavior: Behavior normal.     Wt Readings from Last 3 Encounters:  09/22/20 222 lb (100.7 kg)  08/08/20 219 lb (99.3 kg)  07/04/20 222 lb (100.7 kg)    BP 128/80   Pulse 90   Ht 6' (1.829 m)   Wt 222 lb (100.7 kg)  SpO2 96%   BMI 30.11 kg/m   Assessment and Plan: 1. Gross hematuria With evidence of persistent vs recurrent bacterial infection Will send for culture and treat as indication See urology - POCT Urinalysis Dipstick - Urine Culture - Ambulatory referral to Urology  2. Recurrent UTI - Urine Culture - Ambulatory referral to Urology   Partially dictated using [place]. Any errors are unintentional.  Halina Maidens, MD [facility]  09/22/2020

## 2020-09-25 ENCOUNTER — Other Ambulatory Visit: Payer: Self-pay | Admitting: Internal Medicine

## 2020-09-25 DIAGNOSIS — N3001 Acute cystitis with hematuria: Secondary | ICD-10-CM

## 2020-09-25 LAB — URINE CULTURE

## 2020-09-25 MED ORDER — SULFAMETHOXAZOLE-TRIMETHOPRIM 800-160 MG PO TABS: 1.0000 | ORAL_TABLET | Freq: Two times a day (BID) | ORAL | 0 refills | Status: AC

## 2020-09-29 ENCOUNTER — Encounter: Payer: Self-pay | Admitting: *Deleted

## 2020-09-29 ENCOUNTER — Ambulatory Visit: Payer: BC Managed Care – PPO | Admitting: Gastroenterology

## 2020-09-29 NOTE — Progress Notes (Deleted)
Primary Care Physician: Glean Hess, MD  Primary Gastroenterologist:  Dr. Lucilla Lame  No chief complaint on file.   HPI: Harold Carroll is a 55 y.o. male here with a history of a food bolus impaction.  It appears the patient was in the ER back in November and reported food being stuck in his esophagus.  At that time the patient was treated with glucagon and the food passed.  The patient had seen me in the past for colonoscopy back in 2020.  At that time polyps were removed and there were 3 adenomas found.  The patient was recommended to have a repeat colonoscopy in 5 years.  The patient then followed up with his primary care provider after his ER visit and was seen in December with a report of GERD and was recommended due to the history of GERD and his food impaction that he follow-up with GI.  The patient is now here to see me today because of this.  Past Medical History:  Diagnosis Date  . GERD (gastroesophageal reflux disease)   . Panic attacks   . Polycythemia     Current Outpatient Medications  Medication Sig Dispense Refill  . ASPIRIN 81 PO Take by mouth daily.    . cetirizine (ZYRTEC) 10 MG tablet Take 10 mg by mouth daily.    . fluticasone (FLONASE) 50 MCG/ACT nasal spray Place 2 sprays into both nostrils daily. 16 g 6  . LORazepam (ATIVAN) 0.5 MG tablet Take 1 tablet (0.5 mg total) by mouth daily as needed for anxiety. 10 tablet 0  . Multiple Vitamin (MULTIVITAMIN) capsule Take 1 capsule by mouth daily.    Marland Kitchen omeprazole (PRILOSEC) 10 MG capsule Take 10 mg by mouth daily.    . sildenafil (REVATIO) 20 MG tablet Take 1 tablet (20 mg total) by mouth as needed. Take 1-5 tabs as needed prior to intercourse 30 tablet 11  . simvastatin (ZOCOR) 10 MG tablet Take 1 tablet (10 mg total) by mouth at bedtime. 90 tablet 3  . sulfamethoxazole-trimethoprim (BACTRIM DS) 800-160 MG tablet Take 1 tablet by mouth 2 (two) times daily for 10 days. 20 tablet 0  . tamsulosin (FLOMAX)  0.4 MG CAPS capsule Take 1 capsule (0.4 mg total) by mouth daily. 90 capsule 1   No current facility-administered medications for this visit.    Allergies as of 09/29/2020 - Review Complete 09/22/2020  Allergen Reaction Noted  . Gramineae pollens  01/28/2020    ROS:  General: Negative for anorexia, weight loss, fever, chills, fatigue, weakness. ENT: Negative for hoarseness, difficulty swallowing , nasal congestion. CV: Negative for chest pain, angina, palpitations, dyspnea on exertion, peripheral edema.  Respiratory: Negative for dyspnea at rest, dyspnea on exertion, cough, sputum, wheezing.  GI: See history of present illness. GU:  Negative for dysuria, hematuria, urinary incontinence, urinary frequency, nocturnal urination.  Endo: Negative for unusual weight change.    Physical Examination:   There were no vitals taken for this visit.  General: Well-nourished, well-developed in no acute distress.  Eyes: No icterus. Conjunctivae pink. Lungs: Clear to auscultation bilaterally. Non-labored. Heart: Regular rate and rhythm, no murmurs rubs or gallops.  Abdomen: Bowel sounds are normal, nontender, nondistended, no hepatosplenomegaly or masses, no abdominal bruits or hernia , no rebound or guarding.   Extremities: No lower extremity edema. No clubbing or deformities. Neuro: Alert and oriented x 3.  Grossly intact. Skin: Warm and dry, no jaundice.   Psych: Alert and cooperative, normal mood  and affect.  Labs:    Imaging Studies: No results found.  Assessment and Plan:   Harold Carroll is a 55 y.o. y/o male ***     Lucilla Lame, MD. [facility]    Note: This dictation was prepared with Dragon dictation along with smaller phrase technology. Any transcriptional errors that result from this process are unintentional.

## 2020-10-08 ENCOUNTER — Ambulatory Visit: Payer: BC Managed Care – PPO | Admitting: Urology

## 2020-10-08 ENCOUNTER — Encounter: Payer: Self-pay | Admitting: Urology

## 2020-10-08 ENCOUNTER — Other Ambulatory Visit: Payer: Self-pay

## 2020-10-08 VITALS — BP 147/89 | HR 78 | Ht 72.0 in | Wt 222.0 lb

## 2020-10-08 DIAGNOSIS — N390 Urinary tract infection, site not specified: Secondary | ICD-10-CM | POA: Diagnosis not present

## 2020-10-08 DIAGNOSIS — R3914 Feeling of incomplete bladder emptying: Secondary | ICD-10-CM

## 2020-10-08 DIAGNOSIS — N401 Enlarged prostate with lower urinary tract symptoms: Secondary | ICD-10-CM | POA: Diagnosis not present

## 2020-10-08 DIAGNOSIS — R310 Gross hematuria: Secondary | ICD-10-CM | POA: Diagnosis not present

## 2020-10-08 LAB — BLADDER SCAN AMB NON-IMAGING: Scan Result: 224

## 2020-10-08 NOTE — Patient Instructions (Signed)
Cystoscopy Cystoscopy is a procedure that is used to help diagnose and sometimes treat conditions that affect the lower urinary tract. The lower urinary tract includes the bladder and the urethra. The urethra is the tube that drains urine from the bladder. Cystoscopy is done using a thin, tube-shaped instrument with a light and camera at the end (cystoscope). The cystoscope may be hard or flexible, depending on the goal of the procedure. The cystoscope is inserted through the urethra, into the bladder. Cystoscopy may be recommended if you have:  Urinary tract infections that keep coming back.  Blood in the urine (hematuria).  An inability to control when you urinate (urinary incontinence) or an overactive bladder.  Unusual cells found in a urine sample.  A blockage in the urethra, such as a urinary stone.  Painful urination.  An abnormality in the bladder found during an intravenous pyelogram (IVP) or CT scan. Cystoscopy may also be done to remove a sample of tissue to be examined under a microscope (biopsy). What are the risks? Generally, this is a safe procedure. However, problems may occur, including:  Infection.  Bleeding.  What happens during the procedure?  1. You will be given one or more of the following: ? A medicine to numb the area (local anesthetic). 2. The area around the opening of your urethra will be cleaned. 3. The cystoscope will be passed through your urethra into your bladder. 4. Germ-free (sterile) fluid will flow through the cystoscope to fill your bladder. The fluid will stretch your bladder so that your health care provider can clearly examine your bladder walls. 5. Your doctor will look at the urethra and bladder. 6. The cystoscope will be removed The procedure may vary among health care providers  What can I expect after the procedure? After the procedure, it is common to have: 1. Some soreness or pain in your abdomen and urethra. 2. Urinary symptoms.  These include: ? Mild pain or burning when you urinate. Pain should stop within a few minutes after you urinate. This may last for up to 1 week. ? A small amount of blood in your urine for several days. ? Feeling like you need to urinate but producing only a small amount of urine. Follow these instructions at home: General instructions  Return to your normal activities as told by your health care provider.   Do not drive for 24 hours if you were given a sedative during your procedure.  Watch for any blood in your urine. If the amount of blood in your urine increases, call your health care provider.  If a tissue sample was removed for testing (biopsy) during your procedure, it is up to you to get your test results. Ask your health care provider, or the department that is doing the test, when your results will be ready.  Drink enough fluid to keep your urine pale yellow.  Keep all follow-up visits as told by your health care provider. This is important. Contact a health care provider if you:  Have pain that gets worse or does not get better with medicine, especially pain when you urinate.  Have trouble urinating.  Have more blood in your urine. Get help right away if you:  Have blood clots in your urine.  Have abdominal pain.  Have a fever or chills.  Are unable to urinate. Summary  Cystoscopy is a procedure that is used to help diagnose and sometimes treat conditions that affect the lower urinary tract.  Cystoscopy is done using   a thin, tube-shaped instrument with a light and camera at the end.  After the procedure, it is common to have some soreness or pain in your abdomen and urethra.  Watch for any blood in your urine. If the amount of blood in your urine increases, call your health care provider.  If you were prescribed an antibiotic medicine, take it as told by your health care provider. Do not stop taking the antibiotic even if you start to feel better. This  information is not intended to replace advice given to you by your health care provider. Make sure you discuss any questions you have with your health care provider. Document Revised: 07/11/2018 Document Reviewed: 07/11/2018 Elsevier Patient Education  2020 [employer]   

## 2020-10-08 NOTE — Progress Notes (Signed)
10/08/2020 3:54 PM   Harold Carroll 17-Jul-1966 [id number]  Referring provider: Glean Hess, MD [address] [place],  [place] [address]  Chief Complaint  Patient presents with  . Hematuria    HPI: 55 year old male who presents today to discuss ongoing recurrent urinary tract infections or gross hematuria.  He is a known personal history of BPH with difficulty emptying his bladder.  Was started on Flomax and had improvement in his urinary symptoms.  For the past several months in a row, is noted to have foul-smelling urine without any other associated symptoms.  It is growing E. coli in his urine on each occasion.  He is currently on a sulfa drug and has 1 or 2 days left of this.  He does report seeing gross hematuria on one occasion.  He is a current smoker, 1.5 packs/day.  He has 2 uncles with bladder cancer.  Results for orders placed or performed in visit on 10/08/20  Bladder Scan (Post Void Residual) in office  Result Value Ref Range   Scan Result 224      PMH: Past Medical History:  Diagnosis Date  . GERD (gastroesophageal reflux disease)   . Panic attacks   . Polycythemia     Surgical History: Past Surgical History:  Procedure Laterality Date  . COLONOSCOPY WITH PROPOFOL N/A 06/18/2019   Procedure: COLONOSCOPY WITH BIOPSY;  Surgeon: Lucilla Lame, MD;  Location: [facility];  Service: Endoscopy;  Laterality: N/A;  . INGUINAL HERNIA REPAIR Left 10/28/2015   Procedure: HERNIA REPAIR INGUINAL ADULT;  Surgeon: Hubbard Robinson, MD;  Location: [facility];  Service: General;  Laterality: Left;  . POLYPECTOMY  06/18/2019   Procedure: POLYPECTOMY;  Surgeon: Lucilla Lame, MD;  Location: [facility];  Service: Endoscopy;;  . SKIN SURGERY     Tumor-benign  . TONSILLECTOMY AND ADENOIDECTOMY    . TUMOR REMOVAL    . VENTRAL HERNIA REPAIR  1968   Dr. Mare Ferrari    Home Medications:  Allergies as of 10/08/2020      Reactions    Gramineae Pollens       Medication List       Accurate as of October 08, 2020  3:54 PM. If you have any questions, ask your nurse or doctor.        ASPIRIN 81 PO Take by mouth daily.   cetirizine 10 MG tablet Commonly known as: ZYRTEC Take 10 mg by mouth daily.   fluticasone 50 MCG/ACT nasal spray Commonly known as: FLONASE Place 2 sprays into both nostrils daily.   LORazepam 0.5 MG tablet Commonly known as: ATIVAN Take 1 tablet (0.5 mg total) by mouth daily as needed for anxiety.   multivitamin capsule Take 1 capsule by mouth daily.   omeprazole 10 MG capsule Commonly known as: PRILOSEC Take 10 mg by mouth daily.   sildenafil 20 MG tablet Commonly known as: Revatio Take 1 tablet (20 mg total) by mouth as needed. Take 1-5 tabs as needed prior to intercourse   simvastatin 10 MG tablet Commonly known as: ZOCOR Take 1 tablet (10 mg total) by mouth at bedtime.   tamsulosin 0.4 MG Caps capsule Commonly known as: FLOMAX Take 1 capsule (0.4 mg total) by mouth daily.       Allergies:  Allergies  Allergen Reactions  . Gramineae Pollens     Family History: Family History  Problem Relation Age of Onset  . Thyroid disease Mother   . Heart disease Father  heart attack  . Diabetes Father   . Lung cancer Maternal Aunt   . Cancer Maternal Aunt        lung cancer  . Heart disease Paternal Uncle   . Cancer Maternal Grandfather        lung cancer  . Prostate cancer Brother 44  . Lupus Brother   . Prostate cancer Maternal Uncle     Social History:  reports that he has been smoking cigarettes. He started smoking about 36 years ago. He has a 45.00 pack-year smoking history. He has never used smokeless tobacco. He reports current alcohol use of about 8.0 standard drinks of alcohol per week. He reports current drug use.   Physical Exam: BP (!) 147/89   Pulse 78   Ht 6' (1.829 m)   Wt 222 lb (100.7 kg)   BMI 30.11 kg/m   Constitutional:  Alert and oriented,  No acute distress. HEENT: [place] AT, moist mucus membranes.  Trachea midline, no masses. Cardiovascular: No clubbing, cyanosis, or edema. Respiratory: Normal respiratory effort, no increased work of breathing. Skin: No rashes, bruises or suspicious lesions. Neurologic: Grossly intact, no focal deficits, moving all 4 extremities. Psychiatric: Normal mood and affect.  Laboratory Data: Lab Results  Component Value Date   WBC 12.8 (H) 06/16/2020   HGB 17.8 (H) 06/16/2020   HCT 54.8 (H) 06/16/2020   MCV 81.4 06/16/2020   PLT 366 06/16/2020    Lab Results  Component Value Date   CREATININE 1.00 07/04/2020     Lab Results  Component Value Date   HGBA1C 5.6 09/29/2015    Urinalysis UA today with 11-30 white blood cells, many bacteria, nitrate positive    Assessment & Plan:    1. Gross hematuria Episode of gross hematuria possibly related to infectious process however given that he is a smoker along with a family history, would recommend pursuing a CT hematuria work-up including CT urogram and cystoscopy  We discussed risk and benefits he is agreeable this plan - Urinalysis, Complete - CT HEMATURIA WORKUP; Future - CULTURE, URINE COMPREHENSIVE  2. Recurrent UTI Possibly chronically colonized with E. coli given lack of symptoms other than for positive urinalysis, may be related to #3 incomplete bladder emptying with outlet obstruction  We will repeat urine culture, only treat if he develops any lower urinary tract symptoms or possibly just prior to cystoscopy to reduce infectious complications  3. Benign prostatic hyperplasia with incomplete bladder emptying Known BPH with improvement in his urinary symptoms on Flomax however he continues to have incomplete bladder emptying today with PVR greater than 200  May consider an outlet procedure, will investigate further with cystoscopy for anatomic evaluation, can obtain prostate size based on CT urogram  He is agreeable this  plan  Continue Flomax for the time being - Bladder Scan (Post Void Residual) in office   Harold Espy, MD  [facility] [address], [place], [place] [address] [phone]

## 2020-10-10 LAB — URINALYSIS, COMPLETE
Bilirubin, UA: NEGATIVE
Glucose, UA: NEGATIVE
Ketones, UA: NEGATIVE
Nitrite, UA: POSITIVE — AB
Protein,UA: NEGATIVE
RBC, UA: NEGATIVE
Specific Gravity, UA: 1.025 (ref 1.005–1.030)
Urobilinogen, Ur: 0.2 mg/dL (ref 0.2–1.0)
pH, UA: 5.5 (ref 5.0–7.5)

## 2020-10-10 LAB — MICROSCOPIC EXAMINATION

## 2020-10-11 LAB — CULTURE, URINE COMPREHENSIVE

## 2020-10-13 ENCOUNTER — Telehealth: Payer: Self-pay

## 2020-10-13 MED ORDER — CIPROFLOXACIN HCL 500 MG PO TABS: 500.0000 mg | ORAL_TABLET | Freq: Two times a day (BID) | ORAL | 0 refills | Status: DC

## 2020-10-13 NOTE — Telephone Encounter (Signed)
-----   Message from Hollice Espy, MD sent at 10/12/2020  2:55 PM EDT ----- Growing multidrug-resistant E. coli which is resistant to the Bactrim that he has been on.  If his only symptom is foul-smelling urine, I do not think that we should treated currently.  I would however like to go ahead and treat him with Cipro 500 mg twice daily for total of 7 days starting 5 days before cystoscopy to reduce infection complications from the scope itself.  Hollice Espy, MD

## 2020-10-13 NOTE — Telephone Encounter (Signed)
Patient notified, confirmed he is not currently having symptoms. Script sent into pharmacy, patient verbalized understanding he is not to start until 5 days prior to cysto

## 2020-10-20 ENCOUNTER — Telehealth: Payer: Self-pay | Admitting: *Deleted

## 2020-10-20 NOTE — Telephone Encounter (Signed)
Contacted patient in attempt to schedule lung screening scan. Patient reports he has too much going on and will consider screening in a month.

## 2020-10-22 ENCOUNTER — Other Ambulatory Visit: Payer: Self-pay

## 2020-10-22 ENCOUNTER — Telehealth: Payer: Self-pay | Admitting: Urology

## 2020-10-22 ENCOUNTER — Ambulatory Visit
Admission: RE | Admit: 2020-10-22 | Discharge: 2020-10-22 | Disposition: A | Discharge status: Home / Self Care | Payer: BC Managed Care – PPO | Source: Ambulatory Visit | Attending: Urology | Admitting: Urology

## 2020-10-22 DIAGNOSIS — N321 Vesicointestinal fistula: Secondary | ICD-10-CM

## 2020-10-22 DIAGNOSIS — R310 Gross hematuria: Secondary | ICD-10-CM | POA: Insufficient documentation

## 2020-10-22 MED ORDER — CIPROFLOXACIN HCL 500 MG PO TABS
500.0000 mg | ORAL_TABLET | Freq: Two times a day (BID) | ORAL | 0 refills | Status: DC
Start: 2020-10-22 — End: 2020-11-14

## 2020-10-22 MED ORDER — IOHEXOL 300 MG/ML  SOLN
150.0000 mL | Freq: Once | INTRAMUSCULAR | Status: AC | PRN
  Administered 2020-10-22: 125 mL via INTRAVENOUS

## 2020-10-22 MED ORDER — METRONIDAZOLE 500 MG PO TABS: 500.0000 mg | ORAL_TABLET | Freq: Three times a day (TID) | ORAL | 0 refills | Status: AC

## 2020-10-22 NOTE — Telephone Encounter (Signed)
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  Clinically, the patient denies any abdominal pain, history of diverticulitis, fevers or chills.  Is not having any significant urinary symptoms at this time either.  Reach out to Dr. Dahlia Byes who agreed to see the patient as an urgent referral as an outpatient.  In the interim, I will get him started on Cipro/ Flagyl x 14 days.  We will plan on cystoscopy as scheduled in about 10 days after adequate antibiotic coverage.  Warning symptoms reviewed.   Hollice Espy, MD

## 2020-10-23 ENCOUNTER — Telehealth: Payer: Self-pay

## 2020-10-23 NOTE — Telephone Encounter (Signed)
Patient is aware of appointment with Dr.Pabon 10/24/2020 @ 11am. Address provided. Patient verbalized understanding.

## 2020-10-24 ENCOUNTER — Ambulatory Visit (INDEPENDENT_AMBULATORY_CARE_PROVIDER_SITE_OTHER): Payer: BC Managed Care – PPO | Admitting: Surgery

## 2020-10-24 ENCOUNTER — Encounter: Payer: Self-pay | Admitting: Surgery

## 2020-10-24 ENCOUNTER — Other Ambulatory Visit: Payer: Self-pay

## 2020-10-24 ENCOUNTER — Other Ambulatory Visit
Admission: RE | Admit: 2020-10-24 | Discharge: 2020-10-24 | Disposition: A | Discharge status: Home / Self Care | Payer: BC Managed Care – PPO | Source: Ambulatory Visit | Attending: Surgery | Admitting: Surgery

## 2020-10-24 VITALS — BP 145/96 | HR 78 | Temp 98.5°F | Ht 72.0 in | Wt 226.0 lb

## 2020-10-24 DIAGNOSIS — N321 Vesicointestinal fistula: Secondary | ICD-10-CM | POA: Diagnosis present

## 2020-10-24 LAB — CBC WITH DIFFERENTIAL/PLATELET
Abs Immature Granulocytes: 0.05 10*3/uL (ref 0.00–0.07)
Basophils Absolute: 0 10*3/uL (ref 0.0–0.1)
Basophils Relative: 0 %
Eosinophils Absolute: 0.4 10*3/uL (ref 0.0–0.5)
Eosinophils Relative: 3 %
HCT: 51.1 % (ref 39.0–52.0)
Hemoglobin: 17.3 g/dL — ABNORMAL HIGH (ref 13.0–17.0)
Immature Granulocytes: 1 %
Lymphocytes Relative: 19 %
Lymphs Abs: 2 10*3/uL (ref 0.7–4.0)
MCH: 29.3 pg (ref 26.0–34.0)
MCHC: 33.9 g/dL (ref 30.0–36.0)
MCV: 86.5 fL (ref 80.0–100.0)
Monocytes Absolute: 0.8 10*3/uL (ref 0.1–1.0)
Monocytes Relative: 7 %
Neutro Abs: 7.6 10*3/uL (ref 1.7–7.7)
Neutrophils Relative %: 70 %
Platelets: 305 10*3/uL (ref 150–400)
RBC: 5.91 MIL/uL — ABNORMAL HIGH (ref 4.22–5.81)
RDW: 15.4 % (ref 11.5–15.5)
WBC: 10.9 10*3/uL — ABNORMAL HIGH (ref 4.0–10.5)
nRBC: 0 % (ref 0.0–0.2)

## 2020-10-24 LAB — COMPREHENSIVE METABOLIC PANEL
ALT: 19 U/L (ref 0–44)
AST: 19 U/L (ref 15–41)
Albumin: 4.2 g/dL (ref 3.5–5.0)
Alkaline Phosphatase: 107 U/L (ref 38–126)
Anion gap: 8 (ref 5–15)
BUN: 12 mg/dL (ref 6–20)
CO2: 23 mmol/L (ref 22–32)
Calcium: 9 mg/dL (ref 8.9–10.3)
Chloride: 105 mmol/L (ref 98–111)
Creatinine, Ser: 1.16 mg/dL (ref 0.61–1.24)
GFR, Estimated: >60 mL/min (ref >60)
Glucose, Bld: 91 mg/dL (ref 70–99)
Potassium: 4.2 mmol/L (ref 3.5–5.1)
Sodium: 136 mmol/L (ref 135–145)
Total Bilirubin: 0.8 mg/dL (ref 0.3–1.2)
Total Protein: 7.1 g/dL (ref 6.5–8.1)

## 2020-10-24 LAB — PROTIME-INR
INR: 1 (ref 0.8–1.2)
Prothrombin Time: 12.3 seconds (ref 11.4–15.2)

## 2020-10-24 NOTE — Progress Notes (Signed)
Patient ID: Harold Carroll, male   DOB: 17-Nov-1965, 55 y.o.   MRN: [id number]  HPI Harold Carroll is a 55 y.o. male pain in consultation at the request of Dr. Erlene Quan for a colovesical fistula.  He has been having significant urinary symptoms for the last 6 weeks or so.  He states that he did have a some point in time left lower quadrant pain but is unsure whether or not this was related to his prior hernia surgery.  He does report some hematuria and foul-smelling urine.  He denies any pneumaturia.  He denies any fevers any chills.  Currently he denies any abdominal pain.  No nausea no vomiting.  He does have a history of known BPH has had some emptying issues with his bladder.  He has tried multiple Bactrim regimens.   Is able to perform more than 4 METS of activity without any shortness of breath or chest pain.  He does smoke a pack and a half a day. He does have a brother with diverticulitis.  Also has 2 uncles with bladder cancer. He did have a history of an open left inguinal hernia repair with mesh 5 years ago. 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  There is some intramural fluid collections, they are less than 2 cm. BMP and CBC was normal  HPI  Past Medical History:  Diagnosis Date  . GERD (gastroesophageal reflux disease)   . Panic attacks   . Polycythemia     Past Surgical History:  Procedure Laterality Date  . COLONOSCOPY WITH PROPOFOL N/A 06/18/2019   Procedure: COLONOSCOPY WITH BIOPSY;  Surgeon: Lucilla Lame, MD;  Location: [facility];  Service: Endoscopy;  Laterality: N/A;  . INGUINAL HERNIA REPAIR Left 10/28/2015   Procedure: HERNIA REPAIR INGUINAL ADULT;  Surgeon: Hubbard Robinson, MD;  Location: [facility];  Service: General;  Laterality: Left;  . POLYPECTOMY  06/18/2019   Procedure: POLYPECTOMY;  Surgeon: Lucilla Lame, MD;  Location: [facility];  Service: Endoscopy;;  .  SKIN SURGERY     Tumor-benign  . TONSILLECTOMY AND ADENOIDECTOMY    . TUMOR REMOVAL    . VENTRAL HERNIA REPAIR  1968   Dr. Mare Ferrari    Family History  Problem Relation Age of Onset  . Thyroid disease Mother   . Heart disease Father        heart attack  . Diabetes Father   . Lung cancer Maternal Aunt   . Cancer Maternal Aunt        lung cancer  . Heart disease Paternal Uncle   . Cancer Maternal Grandfather        lung cancer  . Prostate cancer Brother 38  . Lupus Brother   . Prostate cancer Maternal Uncle     Social History Social History   Tobacco Use  . Smoking status: Current Every Day Smoker    Packs/day: 1.50    Years: 30.00    Pack years: 45.00    Types: Cigarettes    Start date: 10/22/1983    Last attempt to quit: 10/08/2015    Years since quitting: 5.0  . Smokeless tobacco: Never Used  . Tobacco comment: off and on since 1985  Vaping Use  . Vaping Use: Never used  Substance Use Topics  . Alcohol use: Yes    Alcohol/week: 8.0 standard drinks    Types: 8 Standard drinks or equivalent per week  . Drug use:  Yes    Types: Marijuana    Comment: every now and then about 1 time month    Allergies  Allergen Reactions  . Gramineae Pollens     Current Outpatient Medications  Medication Sig Dispense Refill  . ASPIRIN 81 PO Take by mouth daily.    . cetirizine (ZYRTEC) 10 MG tablet Take 10 mg by mouth daily.    . ciprofloxacin (CIPRO) 500 MG tablet Take 1 tablet (500 mg total) by mouth every 12 (twelve) hours. 14 tablet 0  . fluticasone (FLONASE) 50 MCG/ACT nasal spray Place 2 sprays into both nostrils daily. 16 g 6  . LORazepam (ATIVAN) 0.5 MG tablet Take 1 tablet (0.5 mg total) by mouth daily as needed for anxiety. 10 tablet 0  . metroNIDAZOLE (FLAGYL) 500 MG tablet Take 1 tablet (500 mg total) by mouth 3 (three) times daily for 14 days. 42 tablet 0  . Multiple Vitamin (MULTIVITAMIN) capsule Take 1 capsule by mouth daily.    Marland Kitchen omeprazole (PRILOSEC) 10 MG  capsule Take 10 mg by mouth daily.    . sildenafil (REVATIO) 20 MG tablet Take 1 tablet (20 mg total) by mouth as needed. Take 1-5 tabs as needed prior to intercourse 30 tablet 11  . simvastatin (ZOCOR) 10 MG tablet Take 1 tablet (10 mg total) by mouth at bedtime. 90 tablet 3  . tamsulosin (FLOMAX) 0.4 MG CAPS capsule Take 1 capsule (0.4 mg total) by mouth daily. 90 capsule 1   No current facility-administered medications for this visit.     Review of Systems Full ROS  was asked and was negative except for the information on the HPI  Physical Exam Blood pressure (!) 145/96, pulse 78, temperature 98.5 F (36.9 C), temperature source Oral, height 6' (1.829 m), weight 226 lb (102.5 kg), SpO2 97 %. CONSTITUTIONAL: NAD EYES: Pupils are equal, round, , Sclera are non-icteric. EARS, NOSE, MOUTH AND THROAT: He is wearing a mask. Hearing is intact to voice. LYMPH NODES:  Lymph nodes in the neck are normal. RESPIRATORY:  Lungs are clear. There is normal respiratory effort, with equal breath sounds bilaterally, and without pathologic use of accessory muscles. CARDIOVASCULAR: Heart is regular without murmurs, gallops, or rubs. GI: The abdomen is soft, nontender, and nondistended. There are no palpable masses. There is no hepatosplenomegaly. There are normal bowel sounds in all quadrants. GU: Rectal deferred.   MUSCULOSKELETAL: Normal muscle strength and tone. No cyanosis or edema.   SKIN: Turgor is good and there are no pathologic skin lesions or ulcers. NEUROLOGIC: Motor and sensation is grossly normal. Cranial nerves are grossly intact. PSYCH:  Oriented to person, place and time. Affect is normal.  Data Reviewed  I have personally reviewed the patient's imaging, laboratory findings and medical records.    Assessment/Plan 55 year old male with recurrent urinary tract infections and significant colovesicular fistula.  He is currently nontoxic and does not need any immediate surgical intervention  or hospitalization.  Patient is to continue 2-week current regimen with Cipro and Flagyl.  He is also to continue his appointment for cystoscopy by Dr. Erlene Quan to make sure this is not a malignancy within his bladder Also in the interim obtain a CBC and CMP as baseline labs.  I have had an extensive discussion with him regarding the role of elective sigmoid colectomy.  This is a treatment of choice for colovesical fistula.  Do not necessarily think that he needs another colonoscopy given that his last one is less than 1-1/2 years. After  we exclude any potential bladder cancer we will see him back to have another discussion for surgical intervention.  I have also discussed with him about robotic sigmoid colectomy.  His operative course and potential complications.  He understands and is in agreement with current work-up I have discussed this patient in detail with Dr. Erlene Quan and a copy of this report will be sent to her.  Caroleen Hamman, MD FACS General Surgeon 10/24/2020, 12:51 PM

## 2020-10-24 NOTE — Patient Instructions (Signed)
Please go directly to the [facility] lab today, enter the building through the [place] entrance.  Please see your appointment listed below.

## 2020-11-04 ENCOUNTER — Other Ambulatory Visit: Payer: Self-pay | Admitting: Urology

## 2020-11-07 ENCOUNTER — Encounter: Payer: Self-pay | Admitting: Urology

## 2020-11-07 ENCOUNTER — Ambulatory Visit (INDEPENDENT_AMBULATORY_CARE_PROVIDER_SITE_OTHER): Payer: BC Managed Care – PPO | Admitting: Urology

## 2020-11-07 ENCOUNTER — Other Ambulatory Visit: Payer: Self-pay

## 2020-11-07 VITALS — BP 149/93 | HR 79

## 2020-11-07 DIAGNOSIS — R3914 Feeling of incomplete bladder emptying: Secondary | ICD-10-CM

## 2020-11-07 DIAGNOSIS — R310 Gross hematuria: Secondary | ICD-10-CM

## 2020-11-07 DIAGNOSIS — N321 Vesicointestinal fistula: Secondary | ICD-10-CM

## 2020-11-07 DIAGNOSIS — N401 Enlarged prostate with lower urinary tract symptoms: Secondary | ICD-10-CM

## 2020-11-07 DIAGNOSIS — N390 Urinary tract infection, site not specified: Secondary | ICD-10-CM

## 2020-11-07 LAB — URINALYSIS, COMPLETE
Bilirubin, UA: NEGATIVE
Glucose, UA: NEGATIVE
Ketones, UA: NEGATIVE
Nitrite, UA: NEGATIVE
Protein,UA: NEGATIVE
RBC, UA: NEGATIVE
Specific Gravity, UA: 1.02 (ref 1.005–1.030)
Urobilinogen, Ur: 0.2 mg/dL (ref 0.2–1.0)
pH, UA: 6 (ref 5.0–7.5)

## 2020-11-07 LAB — MICROSCOPIC EXAMINATION
Bacteria, UA: NONE SEEN
RBC, Urine: NONE SEEN /hpf (ref 0–2)

## 2020-11-07 NOTE — Progress Notes (Signed)
   11/07/20  CC:  Chief Complaint  Patient presents with  . Cysto    HPI: 55 year old male with recurrent UTI/hematuria found to have possible colovesical fistula on CT scan who presents today for to scopic evaluation.  He is being treated conservatively at this time by Dr. Dahlia Byes with Cipro/ Flagyl.  He has upcoming reassessment.  He continues to be completely asymptomatic without abdominal pain, diarrhea, nausea or vomiting.  He denies any dysuria or significant urinary symptoms other than weak stream.  He also has elevated PVRs.  Personal history of heavy smoking.  CT urogram personally reviewed today.  Blood pressure (!) 149/93, pulse 79. NED. A&Ox3.   No respiratory distress   Abd soft, NT, ND Normal phallus with bilateral descended testicles  Cystoscopy Procedure Note  Patient identification was confirmed, informed consent was obtained, and patient was prepped using Betadine solution.  Lidocaine jelly was administered per urethral meatus.     Pre-Procedure: - Inspection reveals a normal caliber ureteral meatus.  Procedure: The flexible cystoscope was introduced without difficulty - No urethral strictures/lesions are present. - Enlarged prostate trilobar coaptation - Normal bladder neck - Bilateral ureteral orifices identified - Bladder mucosa  reveals no papillary tumors or lesions.  There is some mild subtle inflammation on the lateral walls but fairly unremarkable. - No bladder stones -Moderate trabeculation with 2 distinct diverticula on the posterior bladder wall closer to the bladder neck which I was able to pass the scope, there is layering debris but no other tumors  Retroflexion shows intravesical portion of the prostate extending into the bladder with friable mucosa/hypervascularity   Post-Procedure: - Patient tolerated the procedure well  Assessment/ Plan:  1. Colovesical fistula No obvious fistulous tracts today, no concern for underlying  malignancy  Findings discussed with Dr. Dahlia Byes by telephone today.  "Fluid collections" posteriorly on CT scan likely represent diverticula of the bladder seen on cystoscopy today  Defer management to general surgery, happy to assist as needed  2. Gross hematuria See above - Urinalysis, Complete  3. Recurrent UTI Secondary #1  4. Benign prostatic hyperplasia with incomplete bladder emptying Continue Flomax  Given prostamegaly, moderate trabeculation with diverticula as well as incomplete bladder emptying, patient would likely benefit from an outlet procedure down the road   Recommend repeat cystoscopy to ensure that the inflammatory changes resolve, discussed possible outlet procedure once he is recovered from his bowel surgery  Harold Espy, MD

## 2020-11-10 ENCOUNTER — Encounter: Payer: Self-pay | Admitting: Urology

## 2020-11-14 ENCOUNTER — Inpatient Hospital Stay: Payer: BC Managed Care – PPO | Attending: Oncology

## 2020-11-14 ENCOUNTER — Other Ambulatory Visit: Payer: Self-pay

## 2020-11-14 ENCOUNTER — Inpatient Hospital Stay: Payer: BC Managed Care – PPO | Admitting: Oncology

## 2020-11-14 ENCOUNTER — Inpatient Hospital Stay: Payer: BC Managed Care – PPO

## 2020-11-14 ENCOUNTER — Encounter: Payer: Self-pay | Admitting: Oncology

## 2020-11-14 VITALS — BP 140/96 | HR 84 | Temp 96.7°F | Resp 16 | Wt 224.7 lb

## 2020-11-14 DIAGNOSIS — D751 Secondary polycythemia: Secondary | ICD-10-CM | POA: Insufficient documentation

## 2020-11-14 DIAGNOSIS — F17210 Nicotine dependence, cigarettes, uncomplicated: Secondary | ICD-10-CM | POA: Insufficient documentation

## 2020-11-14 LAB — COMPREHENSIVE METABOLIC PANEL
ALT: 27 U/L (ref 0–44)
AST: 27 U/L (ref 15–41)
Albumin: 4.2 g/dL (ref 3.5–5.0)
Alkaline Phosphatase: 91 U/L (ref 38–126)
Anion gap: 10 (ref 5–15)
BUN: 14 mg/dL (ref 6–20)
CO2: 23 mmol/L (ref 22–32)
Calcium: 9 mg/dL (ref 8.9–10.3)
Chloride: 104 mmol/L (ref 98–111)
Creatinine, Ser: 1.19 mg/dL (ref 0.61–1.24)
GFR, Estimated: >60 mL/min (ref >60)
Glucose, Bld: 137 mg/dL — ABNORMAL HIGH (ref 70–99)
Potassium: 3.8 mmol/L (ref 3.5–5.1)
Sodium: 137 mmol/L (ref 135–145)
Total Bilirubin: 0.9 mg/dL (ref 0.3–1.2)
Total Protein: 7.2 g/dL (ref 6.5–8.1)

## 2020-11-14 LAB — CBC WITH DIFFERENTIAL/PLATELET
Abs Immature Granulocytes: 0.04 10*3/uL (ref 0.00–0.07)
Basophils Absolute: 0.1 10*3/uL (ref 0.0–0.1)
Basophils Relative: 1 %
Eosinophils Absolute: 0.3 10*3/uL (ref 0.0–0.5)
Eosinophils Relative: 3 %
HCT: 51.3 % (ref 39.0–52.0)
Hemoglobin: 17.7 g/dL — ABNORMAL HIGH (ref 13.0–17.0)
Immature Granulocytes: 0 %
Lymphocytes Relative: 23 %
Lymphs Abs: 2.3 10*3/uL (ref 0.7–4.0)
MCH: 29.5 pg (ref 26.0–34.0)
MCHC: 34.5 g/dL (ref 30.0–36.0)
MCV: 85.6 fL (ref 80.0–100.0)
Monocytes Absolute: 0.7 10*3/uL (ref 0.1–1.0)
Monocytes Relative: 7 %
Neutro Abs: 6.7 10*3/uL (ref 1.7–7.7)
Neutrophils Relative %: 66 %
Platelets: 317 10*3/uL (ref 150–400)
RBC: 5.99 MIL/uL — ABNORMAL HIGH (ref 4.22–5.81)
RDW: 14.9 % (ref 11.5–15.5)
WBC: 10.2 10*3/uL (ref 4.0–10.5)
nRBC: 0 % (ref 0.0–0.2)

## 2020-11-14 NOTE — Progress Notes (Signed)
Pt states that his bladder and bowel has attached and the bowel was leaking into his bladder causing infection and he will be scheduled for surgery with dr Erlene Quan and dr pabon.

## 2020-11-17 NOTE — Progress Notes (Signed)
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 Of note patient has had an elevated hemoglobin dating back to 2016 and his yearly hemoglobin trends have been 17.8, 18.8, 19.3 and then most recently 20.1 on 01/24/2019.Hemoglobin and platelet counts have been normal. He does have a chronic smoking history and currently smokes 1 pack/day and has been doing so for the last 30 years. He denies any exogenous testosterone use. He does report getting a good night sleep and wakes up in the morning feeling refreshed but does note that his partner has noticed that he snores at night. He does not have any known chronic lung disease.  EPO level was normal. JAK2 mutation testing was negative. UA showed no hematuria  Interval history-patient was noted to have a lesion of a part of the colon to his bladder and causing colovesical fistula.  He has met with Dr. Dahlia Byes and will be undergoing surgery for this soon.  Reports foul-smelling urine and UTI secondary to this.  Denies other complaints at this time  ECOG PS- 1 Pain scale- 0   Review of systems- Review of Systems  Constitutional: Negative for chills, fever, malaise/fatigue and weight loss.  HENT: Negative for congestion, ear discharge and nosebleeds.   Eyes: Negative for blurred vision.  Respiratory: Negative for cough,  hemoptysis, sputum production, shortness of breath and wheezing.   Cardiovascular: Negative for chest pain, palpitations, orthopnea and claudication.  Gastrointestinal: Negative for abdominal pain, blood in stool, constipation, diarrhea, heartburn, melena, nausea and vomiting.  Genitourinary: Negative for dysuria, flank pain, frequency, hematuria and urgency.  Musculoskeletal: Negative for back pain, joint pain and myalgias.  Skin: Negative for rash.  Neurological: Negative for dizziness, tingling, focal weakness, seizures, weakness and headaches.  Endo/Heme/Allergies: Does not bruise/bleed easily.  Psychiatric/Behavioral: Negative for depression and suicidal ideas. The patient does not have insomnia.        Allergies  Allergen Reactions  . Gramineae Pollens      Past Medical History:  Diagnosis Date  . GERD (gastroesophageal reflux disease)   . Panic attacks   . Polycythemia      Past Surgical History:  Procedure Laterality Date  . COLONOSCOPY WITH PROPOFOL N/A 06/18/2019   Procedure: COLONOSCOPY WITH BIOPSY;  Surgeon: Lucilla Lame, MD;  Location: [place];  Service: Endoscopy;  Laterality: N/A;  . INGUINAL HERNIA REPAIR Left 10/28/2015   Procedure: HERNIA REPAIR INGUINAL ADULT;  Surgeon: Hubbard Robinson, MD;  Location: [facility];  Service: General;  Laterality: Left;  . POLYPECTOMY  06/18/2019   Procedure: POLYPECTOMY;  Surgeon: Lucilla Lame, MD;  Location: [place];  Service: Endoscopy;;  . SKIN SURGERY     Tumor-benign  . TONSILLECTOMY AND ADENOIDECTOMY    . TUMOR REMOVAL    . VENTRAL HERNIA REPAIR  1968   Dr. Mare Ferrari    Social History   Socioeconomic History  . Marital status: Divorced    Spouse name: Not on file  . Number  of children: Not on file  . Years of education: Not on file  . Highest education level: Not on file  Occupational History  . Not on file  Tobacco Use  . Smoking status: Current Every Day Smoker    Packs/day: 1.50     Years: 30.00    Pack years: 45.00    Types: Cigarettes    Start date: 10/22/1983    Last attempt to quit: 10/08/2015    Years since quitting: 5.1  . Smokeless tobacco: Never Used  . Tobacco comment: off and on since 1985  Vaping Use  . Vaping Use: Never used  Substance and Sexual Activity  . Alcohol use: Yes    Alcohol/week: 8.0 standard drinks    Types: 8 Standard drinks or equivalent per week    Comment: pt has hardly any drinks during week and weekends drinks more mostly liquor and some beer  . Drug use: Yes    Types: Marijuana    Comment: every now and then about 1 time month  . Sexual activity: Yes  Other Topics Concern  . Not on file  Social History Narrative  . Not on file   Social Determinants of Health   Financial Resource Strain: Not on file  Food Insecurity: Not on file  Transportation Needs: Not on file  Physical Activity: Not on file  Stress: Not on file  Social Connections: Not on file  Intimate Partner Violence: Not on file    Family History  Problem Relation Age of Onset  . Thyroid disease Mother   . Heart disease Father        heart attack  . Diabetes Father   . Lung cancer Maternal Aunt   . Cancer Maternal Aunt        lung cancer  . Heart disease Paternal Uncle   . Cancer Maternal Grandfather        lung cancer  . Prostate cancer Brother 32  . Lupus Brother   . Prostate cancer Maternal Uncle      Current Outpatient Medications:  .  ASPIRIN 81 PO, Take by mouth daily., Disp: , Rfl:  .  cetirizine (ZYRTEC) 10 MG tablet, Take 10 mg by mouth daily., Disp: , Rfl:  .  fluticasone (FLONASE) 50 MCG/ACT nasal spray, Place 2 sprays into both nostrils daily., Disp: 16 g, Rfl: 6 .  LORazepam (ATIVAN) 0.5 MG tablet, Take 1 tablet (0.5 mg total) by mouth daily as needed for anxiety., Disp: 10 tablet, Rfl: 0 .  Multiple Vitamin (MULTIVITAMIN) capsule, Take 1 capsule by mouth daily., Disp: , Rfl:  .  omeprazole (PRILOSEC) 10 MG capsule, Take 10 mg by  mouth daily., Disp: , Rfl:  .  sildenafil (REVATIO) 20 MG tablet, Take 1 tablet (20 mg total) by mouth as needed. Take 1-5 tabs as needed prior to intercourse, Disp: 30 tablet, Rfl: 11 .  simvastatin (ZOCOR) 10 MG tablet, Take 1 tablet (10 mg total) by mouth at bedtime., Disp: 90 tablet, Rfl: 3 .  tamsulosin (FLOMAX) 0.4 MG CAPS capsule, Take 1 capsule (0.4 mg total) by mouth daily., Disp: 90 capsule, Rfl: 1  Physical exam:  Vitals:   11/14/20 1405  BP: (!) 140/96  Pulse: 84  Resp: 16  Temp: (!) 96.7 F (35.9 C)  TempSrc: Tympanic  SpO2: 98%  Weight: 224 lb 11.2 oz (101.9 kg)   Physical Exam Constitutional:      General: He is not in acute distress. Cardiovascular:     Rate  and Rhythm: Normal rate and regular rhythm.     Heart sounds: Normal heart sounds.  Pulmonary:     Effort: Pulmonary effort is normal.     Breath sounds: Normal breath sounds.  Abdominal:     General: Bowel sounds are normal.     Palpations: Abdomen is soft.  Skin:    General: Skin is warm and dry.  Neurological:     Mental Status: He is alert and oriented to person, place, and time.      CMP Latest Ref Rng & Units 11/14/2020  Glucose 70 - 99 mg/dL 137(H)  BUN 6 - 20 mg/dL 14  Creatinine 0.61 - 1.24 mg/dL 1.19  Sodium 135 - 145 mmol/L 137  Potassium 3.5 - 5.1 mmol/L 3.8  Chloride 98 - 111 mmol/L 104  CO2 22 - 32 mmol/L 23  Calcium 8.9 - 10.3 mg/dL 9.0  Total Protein 6.5 - 8.1 g/dL 7.2  Total Bilirubin 0.3 - 1.2 mg/dL 0.9  Alkaline Phos 38 - 126 U/L 91  AST 15 - 41 U/L 27  ALT 0 - 44 U/L 27   CBC Latest Ref Rng & Units 11/14/2020  WBC 4.0 - 10.5 K/uL 10.2  Hemoglobin 13.0 - 17.0 g/dL 17.7(H)  Hematocrit 39.0 - 52.0 % 51.3  Platelets 150 - 400 K/uL 317    No images are attached to the encounter.  CT HEMATURIA WORKUP  Result Date: 10/22/2020 CLINICAL DATA:  Hematuria. EXAM: CT ABDOMEN AND PELVIS WITHOUT AND WITH CONTRAST TECHNIQUE: Multidetector CT imaging of the abdomen and pelvis was  performed following the standard protocol before and following the bolus administration of intravenous contrast. CONTRAST:  158m OMNIPAQUE IOHEXOL 300 MG/ML  SOLN COMPARISON:  None. FINDINGS: Lower chest: No acute abnormality. Hepatobiliary: Segment 5 liver cyst measures 1.2 cm, image 34/4. No suspicious liver abnormality. Gallbladder normal. No bile duct dilatation. Pancreas: Unremarkable. No pancreatic ductal dilatation or surrounding inflammatory changes. Spleen: Normal in size without focal abnormality. Adrenals/Urinary Tract: Normal right adrenal gland. Left adrenal adenoma measures 1.1 cm, image 26/2. No kidney stones identified bilaterally. No hydronephrosis or mass. Abnormal asymmetric wall thickening is identified with intramural fluid collection along the left posterior lateral wall of the bladder. Here, bladder wall thickness measures up to 2.5 cm. Intramural gas and fluid collection in this area measures 1.7 x 1.2 cm, image 74/4. Smaller more lateral fluid collection within the bladder wall measures 1.2 cm, coronal image 59/12. There is fistulous communication with adjacent loop of sigmoid colon, which is best seen on coronal image 39 of series 5. Stomach/Bowel: Stomach appears nondistended. The appendix is visualized and appears normal. Extensive distal colonic diverticulosis identified. Wall thickening of the sigmoid colon is identified with surrounding haziness and mild hyperemia of the sigmoid Vasa recta, image 69/4 and image 65/4. Vascular/Lymphatic: Aortic atherosclerosis without aneurysm. No abdominopelvic adenopathy identified. Reproductive: Mild prostate gland enlargement indents the base of bladder. Other: No free fluid identified. Musculoskeletal: No acute or significant osseous findings. IMPRESSION: 1. Extensive sigmoid diverticulosis with wall thickening and hyperemia of the adjacent Vasa recta and mild surrounding haziness. Findings are concerning for chronic diverticular disease. 2. There  is asymmetric left-sided bladder wall thickening with intramural fluid collections and fistulous communication with the adjacent sigmoid colon. Electronically Signed   By: TKerby MoorsM.D.   On: 10/22/2020 11:30     Assessment and plan- Patient is a 55y.o. male with secondary polycythemia here for routine follow-up  Patient's hematocrit remains less than 55 and  he does not require a phlebotomy at this time.Over the last 1 year his hematocrit has remained less than 55 and he has not required a phlebotomy so far.  I will see him back in 6 months with a CBC   Visit Diagnosis 1. Secondary polycythemia      Dr. Randa Evens, MD, MPH [facility] [phone] 11/17/2020 8:10 AM

## 2020-11-24 ENCOUNTER — Ambulatory Visit (INDEPENDENT_AMBULATORY_CARE_PROVIDER_SITE_OTHER): Payer: BC Managed Care – PPO | Admitting: Surgery

## 2020-11-24 ENCOUNTER — Encounter: Payer: Self-pay | Admitting: Surgery

## 2020-11-24 ENCOUNTER — Telehealth: Payer: Self-pay | Admitting: *Deleted

## 2020-11-24 ENCOUNTER — Other Ambulatory Visit: Payer: Self-pay

## 2020-11-24 VITALS — BP 149/89 | HR 89 | Temp 98.6°F | Ht 72.0 in | Wt 226.0 lb

## 2020-11-24 DIAGNOSIS — N321 Vesicointestinal fistula: Secondary | ICD-10-CM | POA: Diagnosis not present

## 2020-11-24 DIAGNOSIS — R1084 Generalized abdominal pain: Secondary | ICD-10-CM

## 2020-11-24 NOTE — Patient Instructions (Addendum)
We will get you scheduled for a CT scan to look at your bowel and then you will follow up here after.   You will follow up here in about 3 weeks.

## 2020-11-24 NOTE — Telephone Encounter (Signed)
Received referral for initial lung cancer screening scan. Contacted patient and asked him if he would like to go ahead and schedule the lung screening. Patient stated that he was getting ready to have extensive colon surgery, and he would like to put this off for a couple of months. Told patient that we would call him back in about 2 months to see if he wants to schedule.

## 2020-11-26 ENCOUNTER — Encounter: Payer: Self-pay | Admitting: Surgery

## 2020-11-26 NOTE — Progress Notes (Signed)
Outpatient Surgical Follow Up  11/26/2020  Harold Carroll is an 55 y.o. male.   Chief Complaint  Patient presents with  . Follow-up    HPI: Harold Carroll is a 55 year old male well-known to me with a prior history of recurrent UTIs and CT findings consistent with colovesical fistula.  He has completed antibiotic therapy and feels better.  He feels that his urine is clear.  He also underwent cystoscopy by Dr. Erlene Quan showing evidence of bladder diverticuli without evidence of cancer there was no evidence of an obvious fistulous tract. I have again we reviewed personally his CT scan there is evidence of significant diverticular disease consistent with colovesical fistula. He denies any abdominal pain at this time no fevers no chills he is not toxic. He did have a colonoscopy a year and a half ago showing evidence of a tubular adenoma and 3 hyperplastic polyps.  Past Medical History:  Diagnosis Date  . GERD (gastroesophageal reflux disease)   . Panic attacks   . Polycythemia     Past Surgical History:  Procedure Laterality Date  . COLONOSCOPY WITH PROPOFOL N/A 06/18/2019   Procedure: COLONOSCOPY WITH BIOPSY;  Surgeon: Lucilla Lame, MD;  Location: [place];  Service: Endoscopy;  Laterality: N/A;  . INGUINAL HERNIA REPAIR Left 10/28/2015   Procedure: HERNIA REPAIR INGUINAL ADULT;  Surgeon: Hubbard Robinson, MD;  Location: [facility];  Service: General;  Laterality: Left;  . POLYPECTOMY  06/18/2019   Procedure: POLYPECTOMY;  Surgeon: Lucilla Lame, MD;  Location: [place];  Service: Endoscopy;;  . SKIN SURGERY     Tumor-benign  . TONSILLECTOMY AND ADENOIDECTOMY    . TUMOR REMOVAL    . VENTRAL HERNIA REPAIR  1968   Dr. Mare Ferrari    Family History  Problem Relation Age of Onset  . Thyroid disease Mother   . Heart disease Father        heart attack  . Diabetes Father   . Lung cancer Maternal Aunt   . Cancer Maternal Aunt        lung cancer  . Heart disease  Paternal Uncle   . Cancer Maternal Grandfather        lung cancer  . Prostate cancer Brother 49  . Lupus Brother   . Prostate cancer Maternal Uncle     Social History:  reports that he has been smoking cigarettes. He started smoking about 37 years ago. He has a 45.00 pack-year smoking history. He has never used smokeless tobacco. He reports current alcohol use of about 8.0 standard drinks of alcohol per week. He reports current drug use. Drug: Marijuana.  Allergies:  Allergies  Allergen Reactions  . Gramineae Pollens     Medications reviewed.    ROS Full ROS performed and is otherwise negative other than what is stated in HPI   BP (!) 149/89   Pulse 89   Temp 98.6 F (37 C)   Ht 6' (1.829 m)   Wt 102.5 kg   SpO2 94%   BMI 30.65 kg/m   Physical Exam Vitals and nursing note reviewed. Exam conducted with a chaperone present.  Constitutional:      General: He is not in acute distress.    Appearance: Normal appearance. He is normal weight.  Cardiovascular:     Rate and Rhythm: Normal rate and regular rhythm.     Pulses: Normal pulses.  Pulmonary:     Effort: Pulmonary effort is normal.     Breath sounds: Normal breath  sounds. No stridor.  Abdominal:     General: Abdomen is flat. There is no distension.     Palpations: Abdomen is soft. There is no mass.     Tenderness: There is no abdominal tenderness. There is no guarding or rebound.     Hernia: No hernia is present.  Musculoskeletal:     Cervical back: Normal range of motion and neck supple. No rigidity.  Lymphadenopathy:     Cervical: No cervical adenopathy.  Skin:    General: Skin is warm and dry.     Capillary Refill: Capillary refill takes less than 2 seconds.  Neurological:     General: No focal deficit present.     Mental Status: He is alert and oriented to person, place, and time.  Psychiatric:        Mood and Affect: Mood normal.        Behavior: Behavior normal.        Thought Content: Thought  content normal.        Judgment: Judgment normal.      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  He is currently nontoxic and does not need any immediate surgical intervention or hospitalization.   I have once again reviewed his CT scan there is evidence significant diverticulosis with areas consistent with a colovesical fistula.  There is thickening of the sigmoid colon.  I will have also reviewed and discussed the case in detail with Dr. Erlene Quan and there is no evidence of bladder cancer but bladder diverticuli.  There was no evidence of an obvious fistula tract I do think that he will need another CT scan to reevaluate more appropriately his colon wall.  We will use IV and p.o. contrast at this time. Personal belief that he does have a resolving colovesical fistula and a significant diverticular disease that warrants surgical intervention.   I  Do not necessarily think that he needs another colonoscopy given that his last one is less than 1-1/2 years.  I have also discussed with him about robotic sigmoid colectomy.  His operative course and potential complications.  He understands and is in agreement with potential coelctomy Greater than 50% of the 45 minutes  visit was spent in counseling/coordination of care   Caroleen Hamman, MD [place] Surgeon

## 2020-12-08 ENCOUNTER — Encounter: Payer: Self-pay | Admitting: Urology

## 2020-12-08 NOTE — Progress Notes (Signed)
12/09/2020 3:38 PM   Harold Carroll [birth year] [id number]  Referring provider: Glean Hess, MD [address] [place],  [place] [address]  Chief Complaint  Patient presents with  . Dysuria   Urological history: 1. BPH with LU TS -PSA 1.17 in 01/2020 -I PSS 18/5 -PVR 145 mL -managed with tamsulosin 0.4 mg daily   2. ED -contributing factors of age, HLD, BPH and smoking -managed with sildenafil 20 mg, on-demand-dosing  3. Family history of prostate cancer -brother with prostate cancer diagnosed at the age of 65 treated with seed implants  4. Family history of bladder cancer -2 uncles with bladder cancer  5. High risk hematuria -smoker -CTU 09/2020 Normal right adrenal gland. Left adrenal adenoma measures 1.1 cm, image 26/2. No kidney stones identified bilaterally. No hydronephrosis or mass.  Abnormal asymmetric wall thickening is identified with intramural fluid collection along the left posterior lateral wall of the bladder. Here, bladder wall thickness measures up to 2.5 cm.  Intramural gas and fluid collection in this area measures 1.7 x 1.2 cm, image 74/4. Smaller more lateral fluid collection within the bladder wall measures 1.2 cm, coronal image 59/12. There is fistulous communication with adjacent loop of sigmoid colon, which is best seen on coronal image 39 of series 5.  Mild prostate gland enlargement indents the base of bladder. -cysto 10/2020 prostamegaly, moderate trabeculation with diverticula and inflammation on lateral walls -UA 11-30 RBC's - grossly infected  HPI: Harold Carroll is a 55 y.o. male who presents today for symptoms of dysuria, urgency and discomfort.   He states his urine has a foul odor, gross hematuria and dysuria that started a few days ago.  Patient denies any modifying or aggravating factors.  Patient denies any suprapubic/flank pain.  Patient denies any fevers, chills, nausea or vomiting.   UA nitrite positive,  >30 WBC's, 11-30 RBC's and many bacteria.  PVR 145 mL.   PMH: Past Medical History:  Diagnosis Date  . GERD (gastroesophageal reflux disease)   . Panic attacks   . Polycythemia     Surgical History: Past Surgical History:  Procedure Laterality Date  . COLONOSCOPY WITH PROPOFOL N/A 06/18/2019   Procedure: COLONOSCOPY WITH BIOPSY;  Surgeon: Lucilla Lame, MD;  Location: [place];  Service: Endoscopy;  Laterality: N/A;  . INGUINAL HERNIA REPAIR Left 10/28/2015   Procedure: HERNIA REPAIR INGUINAL ADULT;  Surgeon: Hubbard Robinson, MD;  Location: [facility];  Service: General;  Laterality: Left;  . POLYPECTOMY  06/18/2019   Procedure: POLYPECTOMY;  Surgeon: Lucilla Lame, MD;  Location: [place];  Service: Endoscopy;;  . SKIN SURGERY     Tumor-benign  . TONSILLECTOMY AND ADENOIDECTOMY    . TUMOR REMOVAL    . VENTRAL HERNIA REPAIR  1968   Dr. Mare Ferrari    Home Medications:  Allergies as of 12/09/2020      Reactions   Gramineae Pollens       Medication List       Accurate as of Dec 09, 2020 11:59 PM. If you have any questions, ask your nurse or doctor.        aspirin 81 MG EC tablet Take 81 mg by mouth daily.   cetirizine 10 MG tablet Commonly known as: ZYRTEC Take 10 mg by mouth daily.   ciprofloxacin 500 MG tablet Commonly known as: CIPRO Take 1 tablet (500 mg total) by mouth every 12 (twelve) hours. Started by: Zara Council, PA-C   fluticasone 50  MCG/ACT nasal spray Commonly known as: FLONASE Place 2 sprays into both nostrils daily.   LORazepam 0.5 MG tablet Commonly known as: ATIVAN Take 1 tablet (0.5 mg total) by mouth daily as needed for anxiety.   metroNIDAZOLE 500 MG tablet Commonly known as: Flagyl Take 1 tablet (500 mg total) by mouth 3 (three) times daily for 14 days. Started by: Zara Council, PA-C   multivitamin capsule Take 1 capsule by mouth daily.   omeprazole 10 MG capsule Commonly known as: PRILOSEC Take 10 mg by  mouth daily.   sildenafil 20 MG tablet Commonly known as: Revatio Take 1 tablet (20 mg total) by mouth as needed. Take 1-5 tabs as needed prior to intercourse   simvastatin 10 MG tablet Commonly known as: ZOCOR Take 1 tablet (10 mg total) by mouth at bedtime.   tamsulosin 0.4 MG Caps capsule Commonly known as: FLOMAX Take 1 capsule (0.4 mg total) by mouth daily.       Allergies:  No Known Allergies  Family History: Family History  Problem Relation Age of Onset  . Thyroid disease Mother   . Heart disease Father        heart attack  . Diabetes Father   . Lung cancer Maternal Aunt   . Cancer Maternal Aunt        lung cancer  . Heart disease Paternal Uncle   . Cancer Maternal Grandfather        lung cancer  . Prostate cancer Brother 69  . Lupus Brother   . Prostate cancer Maternal Uncle     Social History:  reports that he has been smoking cigarettes. He started smoking about 37 years ago. He has a 45.00 pack-year smoking history. He has never used smokeless tobacco. He reports current alcohol use of about 8.0 standard drinks of alcohol per week. He reports current drug use. Drug: Marijuana.  ROS: Pertinent ROS in HPI  Physical Exam: BP 139/83   Pulse 81   Ht 6' (1.829 m)   Wt 225 lb (102.1 kg)   BMI 30.52 kg/m   Constitutional:  Well nourished. Alert and oriented, No acute distress. HEENT: [place] AT, mask in place.  Trachea midline Cardiovascular: No clubbing, cyanosis, or edema. Respiratory: Normal respiratory effort, no increased work of breathing. Neurologic: Grossly intact, no focal deficits, moving all 4 extremities. Psychiatric: Normal mood and affect.  Laboratory Data: Lab Results  Component Value Date   WBC 10.2 11/14/2020   HGB 17.7 (H) 11/14/2020   HCT 51.3 11/14/2020   MCV 85.6 11/14/2020   PLT 317 11/14/2020    Lab Results  Component Value Date   CREATININE 1.19 11/14/2020       Component Value Date/Time   CHOL 191 07/04/2020 0925   HDL  46 07/04/2020 0925   CHOLHDL 4.2 07/04/2020 0925   LDLCALC 120 (H) 07/04/2020 0925    Lab Results  Component Value Date   AST 27 11/14/2020   Lab Results  Component Value Date   ALT 27 11/14/2020    Urinalysis Component     Latest Ref Rng & Units 12/09/2020  Specific Gravity, UA     1.005 - 1.030 1.015  pH, UA     5.0 - 7.5 5.5  Color, UA     Yellow Yellow  Appearance Ur     Clear Cloudy (A)  Leukocytes,UA     Negative 2+ (A)  Protein,UA     Negative/Trace Negative  Glucose, UA     Negative  Negative  Ketones, UA     Negative Negative  RBC, UA     Negative 2+ (A)  Bilirubin, UA     Negative Negative  Urobilinogen, Ur     0.2 - 1.0 mg/dL 0.2  Nitrite, UA     Negative Positive (A)  Microscopic Examination      See below:   Component     Latest Ref Rng & Units 12/09/2020  WBC, UA     0 - 5 /hpf >30 (A)  RBC     0 - 2 /hpf 11-30 (A)  Epithelial Cells (non renal)     0 - 10 /hpf 0-10  Bacteria, UA     None seen/Few Many (A)  I have reviewed the labs.   Pertinent Imaging: Results for NEFI, MUSICH (MRN [id number]) as of 12/24/2020 15:37  Ref. Range 12/09/2020 15:18  Scan Result Unknown 145   I have independently reviewed the films.    Assessment & Plan:    1. Dysuria -UA is grossly infected, so I will send it for culture -I restarted him on Cipro 500 mg twice daily for 10 days and also Flagyl 500 mg three times daily x 14 days  -follow up cysto in July with Dr. Erlene Quan to ensure inflammation seen on previous cysto resolves  2. Colovesicular fistula -has a follow up with Dr. Dahlia Byes on 12/18/2019 to review today's CT scan and to discuss   3. BPH with LU TS -Findings significant for BOO seen on recent cysto -Follow-up cystoscopy scheduled in July and further discussion regarding a bladder outlet procedure will be conducted   Return for keep follow up with Dr. Dahlia Byes.  These notes generated with voice recognition software. I apologize for  typographical errors.  Zara Council, PA-C  [facility] [address]  [place], [place] [address] [phone]

## 2020-12-08 NOTE — Telephone Encounter (Signed)
Called patient to discuss symptoms-patient completed Cipro/Flagyl last month-he has been experiencing dysuria, urgency and discomfort. Denies fevers, chills body aches or difficulty voiding. Offered appointment for same day for office visit-patient declined-due to work. Scheduled appointment for tomorrow to be seen in the office. He is scheduled to have a CT 12/09/20 tomorrow.

## 2020-12-09 ENCOUNTER — Other Ambulatory Visit: Payer: Self-pay

## 2020-12-09 ENCOUNTER — Ambulatory Visit (INDEPENDENT_AMBULATORY_CARE_PROVIDER_SITE_OTHER): Payer: BC Managed Care – PPO | Admitting: Urology

## 2020-12-09 ENCOUNTER — Ambulatory Visit
Admission: RE | Admit: 2020-12-09 | Discharge: 2020-12-09 | Disposition: A | Discharge status: Home / Self Care | Payer: BC Managed Care – PPO | Source: Ambulatory Visit | Attending: Surgery | Admitting: Surgery

## 2020-12-09 ENCOUNTER — Encounter: Payer: Self-pay | Admitting: Urology

## 2020-12-09 VITALS — BP 139/83 | HR 81 | Ht 72.0 in | Wt 225.0 lb

## 2020-12-09 DIAGNOSIS — R1084 Generalized abdominal pain: Secondary | ICD-10-CM | POA: Insufficient documentation

## 2020-12-09 DIAGNOSIS — N321 Vesicointestinal fistula: Secondary | ICD-10-CM | POA: Insufficient documentation

## 2020-12-09 DIAGNOSIS — R300 Dysuria: Secondary | ICD-10-CM

## 2020-12-09 DIAGNOSIS — N401 Enlarged prostate with lower urinary tract symptoms: Secondary | ICD-10-CM

## 2020-12-09 DIAGNOSIS — R3914 Feeling of incomplete bladder emptying: Secondary | ICD-10-CM

## 2020-12-09 LAB — BLADDER SCAN AMB NON-IMAGING: Scan Result: 145

## 2020-12-09 MED ORDER — CIPROFLOXACIN HCL 500 MG PO TABS: 500.0000 mg | ORAL_TABLET | Freq: Two times a day (BID) | ORAL | 0 refills | Status: DC

## 2020-12-09 MED ORDER — IOHEXOL 300 MG/ML  SOLN
100.0000 mL | Freq: Once | INTRAMUSCULAR | Status: AC | PRN
  Administered 2020-12-09: 100 mL via INTRAVENOUS

## 2020-12-09 MED ORDER — METRONIDAZOLE 500 MG PO TABS: 500.0000 mg | ORAL_TABLET | Freq: Three times a day (TID) | ORAL | 0 refills | Status: AC

## 2020-12-10 LAB — URINALYSIS, COMPLETE
Bilirubin, UA: NEGATIVE
Glucose, UA: NEGATIVE
Ketones, UA: NEGATIVE
Nitrite, UA: POSITIVE — AB
Protein,UA: NEGATIVE
Specific Gravity, UA: 1.015 (ref 1.005–1.030)
Urobilinogen, Ur: 0.2 mg/dL (ref 0.2–1.0)
pH, UA: 5.5 (ref 5.0–7.5)

## 2020-12-10 LAB — MICROSCOPIC EXAMINATION: WBC, UA: >30 /hpf — AB (ref 0–5)

## 2020-12-15 LAB — CULTURE, URINE COMPREHENSIVE

## 2020-12-17 ENCOUNTER — Other Ambulatory Visit: Payer: Self-pay

## 2020-12-17 ENCOUNTER — Encounter: Payer: Self-pay | Admitting: Surgery

## 2020-12-17 ENCOUNTER — Ambulatory Visit: Payer: BC Managed Care – PPO | Admitting: Surgery

## 2020-12-17 VITALS — BP 146/90 | HR 79 | Temp 98.2°F | Ht 72.0 in | Wt 229.0 lb

## 2020-12-17 DIAGNOSIS — N321 Vesicointestinal fistula: Secondary | ICD-10-CM

## 2020-12-17 MED ORDER — POLYETHYLENE GLYCOL 3350 17 GM/SCOOP PO POWD: ORAL | 0 refills | Status: DC

## 2020-12-17 MED ORDER — METRONIDAZOLE 500 MG PO TABS: ORAL_TABLET | ORAL | 0 refills | Status: DC

## 2020-12-17 MED ORDER — NEOMYCIN SULFATE 500 MG PO TABS: ORAL_TABLET | ORAL | 0 refills | Status: DC

## 2020-12-17 MED ORDER — BISACODYL 5 MG PO TBEC: DELAYED_RELEASE_TABLET | ORAL | 0 refills | Status: DC

## 2020-12-17 NOTE — Progress Notes (Signed)
Outpatient Surgical Follow Up  12/17/2020  Haidar Jenetta Downer Folts is an 55 y.o. male.   Chief Complaint  Patient presents with  . Follow-up    Colovesical fistula    HPI: Marya Amsler is a 55 year old male well-known to me with a prior history of recurrent UTIs and CT findings consistent with colovesical fistula.  He has completed antibiotic therapy and feels better.  He feels that his urine is clear.  He also underwent cystoscopy by Dr. Erlene Quan showing evidence of bladder diverticuli without evidence of cancer there was no evidence of an obvious fistulous tract.  He again had a recurrent UTI w foul smelling urine and some occasional pneumaturia and mild abd pain. It responds to antibiotics and he just re-started them again.  HE wishes to avoid prolonged a/bs rx and understands that the fistula is the culprit I have ordered another CT scan with similar findings I have again reviewed personally his CT scan there is evidence of significant diverticular disease consistent with colovesical fistula. He denies any abdominal pain at this time no fevers no chills he is not toxic. He did have a colonoscopy a year and a half ago showing evidence of a tubular adenoma and 3 hyperplastic polyps.  Past Medical History:  Diagnosis Date  . GERD (gastroesophageal reflux disease)   . Panic attacks   . Polycythemia     Past Surgical History:  Procedure Laterality Date  . COLONOSCOPY WITH PROPOFOL N/A 06/18/2019   Procedure: COLONOSCOPY WITH BIOPSY;  Surgeon: Lucilla Lame, MD;  Location: [place];  Service: Endoscopy;  Laterality: N/A;  . INGUINAL HERNIA REPAIR Left 10/28/2015   Procedure: HERNIA REPAIR INGUINAL ADULT;  Surgeon: Hubbard Robinson, MD;  Location: [facility];  Service: General;  Laterality: Left;  . POLYPECTOMY  06/18/2019   Procedure: POLYPECTOMY;  Surgeon: Lucilla Lame, MD;  Location: [place];  Service: Endoscopy;;  . SKIN SURGERY     Tumor-benign  . TONSILLECTOMY AND  ADENOIDECTOMY    . TUMOR REMOVAL    . VENTRAL HERNIA REPAIR  1968   Dr. Mare Ferrari    Family History  Problem Relation Age of Onset  . Thyroid disease Mother   . Heart disease Father        heart attack  . Diabetes Father   . Lung cancer Maternal Aunt   . Cancer Maternal Aunt        lung cancer  . Heart disease Paternal Uncle   . Cancer Maternal Grandfather        lung cancer  . Prostate cancer Brother 61  . Lupus Brother   . Prostate cancer Maternal Uncle     Social History:  reports that he has been smoking cigarettes. He started smoking about 37 years ago. He has a 45.00 pack-year smoking history. He has never used smokeless tobacco. He reports current alcohol use of about 8.0 standard drinks of alcohol per week. He reports current drug use. Drug: Marijuana.  Allergies:  Allergies  Allergen Reactions  . Gramineae Pollens     Medications reviewed.    ROS Full ROS performed and is otherwise negative other than what is stated in HPI   BP (!) 146/90   Pulse 79   Temp 98.2 F (36.8 C) (Oral)   Ht 6' (1.829 m)   Wt 229 lb (103.9 kg)   SpO2 95%   BMI 31.06 kg/m   Physical Exam Vitals and nursing note reviewed. Exam conducted with a chaperone present.  Constitutional:  General: He is not in acute distress.    Appearance: Normal appearance. He is normal weight.  Eyes:     General: No scleral icterus.       Right eye: No discharge.        Left eye: No discharge.  Cardiovascular:     Rate and Rhythm: Normal rate and regular rhythm.     Heart sounds: No murmur heard. No friction rub. No gallop.   Pulmonary:     Effort: Pulmonary effort is normal. No respiratory distress.     Breath sounds: Normal breath sounds. No stridor. No wheezing or rhonchi.  Abdominal:     General: Abdomen is flat. There is no distension.     Palpations: Abdomen is soft. There is no mass.     Tenderness: There is no abdominal tenderness. There is no guarding or rebound.     Hernia:  No hernia is present.  Musculoskeletal:        General: No swelling or tenderness. Normal range of motion.     Cervical back: Normal range of motion and neck supple. No rigidity or tenderness.  Skin:    General: Skin is warm and dry.     Capillary Refill: Capillary refill takes less than 2 seconds.  Neurological:     General: No focal deficit present.     Mental Status: He is alert and oriented to person, place, and time.  Psychiatric:        Mood and Affect: Mood normal.        Behavior: Behavior normal.        Thought Content: Thought content normal.        Judgment: Judgment normal.       Assessment/Plan:  1. 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  Discussed with patient in detail about my thought process and I definitely recommend sigmoid colectomy.  He is in complete agreement with me.  Procedure discussed with patient in detail.  Risks, benefits and possible indications including but not limited to: Bleeding, infection, injury to adjacent structures, pain, need for ostomy. He understands and wishes to proceed.  We will ask urology for injection of ICG into the ureters for appropriate intraoperative navigation   Greater than 50% of the 45 minutes  visit was spent in counseling/coordination of care   Caroleen Hamman, MD [place] Surgeon

## 2020-12-17 NOTE — Patient Instructions (Addendum)
Our surgery scheduler Pamala Hurry will call you within 24-48 hours to get you scheduled. If you have not heard from her after 48 hours, please call our office. You will not need to get Covid tested before surgery and have the blue sheet available when she calls to write down important information.  Please pick up bowel prep at your pharmacy.   If you have any concerns or questions, please feel free to call our office.    COLON BOWEL PREP Please follow the instructions carefully. It is important to clean out your bowels & take the prescribed antibiotic pills to lower your chances of a wound infection or abscess.   FIVE DAYS PRIOR TO YOUR SURGERY Stop eating any nuts, popcorn, or fruit with seeds. Stop all fiber supplements such as Metamucil, Citrucel, etc.   Hold taking any blood thinning anticoagulation medication (ex: aspirin, warfarin/Coumadin, Plavix, Xarelto, Eliquis, Pradaxa, etc) as recommended by your medical/cardiology doctor  Obtain what you need at a pharmacy of your choice: -Filled out prescriptions for your oral antibiotics (Neomycin & Metronidazole)  -A bottle of MiraLax / Glycolax (288g) - no prescription required  -A large bottle of Gatorade / Powerade (64oz)  -Dulcolax tablets (4 tabs) - no prescription required   DAY PRIOR TO SURGERY   7:00am Swallow 4 Dulcolax tablets with some water Drink plenty of clear liquids all day to avoid getting dehydrated (Water, juice, soda, coffee, tea, bouillon, jello, etc.)  10:00am Mix the bottle of MiraLax with the 64-oz bottle of Gatorade.  Drink the Gatorade mixture gradually over the next few hours (8oz glass every 15-30 minutes) until gone. You should finish by 2pm.  2:00pm Take 2 Neomycin 500mg  tablets & 2 Metronidazole 500mg  tablets  3:00pm Take 2 Neomycin 500mg  tablets & 2 Metronidazole 500mg  tablets  Drink plenty of clear liquids all evening to avoid getting dehydrated  10:00pm Take 2 Neomycin 500mg  tablets & 2  Metronidazole 500mg  tablets  Do not eat or drink anything after bedtime (midnight) the night before your surgery.   MORNING OF SURGERY Remember to not to drink or eat anything that morning  Hold or take medications as recommended by the hospital staff at your Preoperative visit

## 2020-12-17 NOTE — H&P (View-Only) (Signed)
Outpatient Surgical Follow Up  12/17/2020  Harold Carroll is an 55 y.o. male.   Chief Complaint  Patient presents with  . Follow-up    Colovesical fistula    HPI: Harold Carroll is a 55 year old male well-known to me with a prior history of recurrent UTIs and CT findings consistent with colovesical fistula.  He has completed antibiotic therapy and feels better.  He feels that his urine is clear.  He also underwent cystoscopy by Dr. Erlene Quan showing evidence of bladder diverticuli without evidence of cancer there was no evidence of an obvious fistulous tract.  He again had a recurrent UTI w foul smelling urine and some occasional pneumaturia and mild abd pain. It responds to antibiotics and he just re-started them again.  HE wishes to avoid prolonged a/bs rx and understands that the fistula is the culprit I have ordered another CT scan with similar findings I have again reviewed personally his CT scan there is evidence of significant diverticular disease consistent with colovesical fistula. He denies any abdominal pain at this time no fevers no chills he is not toxic. He did have a colonoscopy a year and a half ago showing evidence of a tubular adenoma and 3 hyperplastic polyps.  Past Medical History:  Diagnosis Date  . GERD (gastroesophageal reflux disease)   . Panic attacks   . Polycythemia     Past Surgical History:  Procedure Laterality Date  . COLONOSCOPY WITH PROPOFOL N/A 06/18/2019   Procedure: COLONOSCOPY WITH BIOPSY;  Surgeon: Lucilla Lame, MD;  Location: [place];  Service: Endoscopy;  Laterality: N/A;  . INGUINAL HERNIA REPAIR Left 10/28/2015   Procedure: HERNIA REPAIR INGUINAL ADULT;  Surgeon: Hubbard Robinson, MD;  Location: [facility];  Service: General;  Laterality: Left;  . POLYPECTOMY  06/18/2019   Procedure: POLYPECTOMY;  Surgeon: Lucilla Lame, MD;  Location: [place];  Service: Endoscopy;;  . SKIN SURGERY     Tumor-benign  . TONSILLECTOMY AND  ADENOIDECTOMY    . TUMOR REMOVAL    . VENTRAL HERNIA REPAIR  1968   Dr. Mare Ferrari    Family History  Problem Relation Age of Onset  . Thyroid disease Mother   . Heart disease Father        heart attack  . Diabetes Father   . Lung cancer Maternal Aunt   . Cancer Maternal Aunt        lung cancer  . Heart disease Paternal Uncle   . Cancer Maternal Grandfather        lung cancer  . Prostate cancer Brother 71  . Lupus Brother   . Prostate cancer Maternal Uncle     Social History:  reports that he has been smoking cigarettes. He started smoking about 37 years ago. He has a 45.00 pack-year smoking history. He has never used smokeless tobacco. He reports current alcohol use of about 8.0 standard drinks of alcohol per week. He reports current drug use. Drug: Marijuana.  Allergies:  Allergies  Allergen Reactions  . Gramineae Pollens     Medications reviewed.    ROS Full ROS performed and is otherwise negative other than what is stated in HPI   BP (!) 146/90   Pulse 79   Temp 98.2 F (36.8 C) (Oral)   Ht 6' (1.829 m)   Wt 229 lb (103.9 kg)   SpO2 95%   BMI 31.06 kg/m   Physical Exam Vitals and nursing note reviewed. Exam conducted with a chaperone present.  Constitutional:  General: He is not in acute distress.    Appearance: Normal appearance. He is normal weight.  Eyes:     General: No scleral icterus.       Right eye: No discharge.        Left eye: No discharge.  Cardiovascular:     Rate and Rhythm: Normal rate and regular rhythm.     Heart sounds: No murmur heard. No friction rub. No gallop.   Pulmonary:     Effort: Pulmonary effort is normal. No respiratory distress.     Breath sounds: Normal breath sounds. No stridor. No wheezing or rhonchi.  Abdominal:     General: Abdomen is flat. There is no distension.     Palpations: Abdomen is soft. There is no mass.     Tenderness: There is no abdominal tenderness. There is no guarding or rebound.     Hernia:  No hernia is present.  Musculoskeletal:        General: No swelling or tenderness. Normal range of motion.     Cervical back: Normal range of motion and neck supple. No rigidity or tenderness.  Skin:    General: Skin is warm and dry.     Capillary Refill: Capillary refill takes less than 2 seconds.  Neurological:     General: No focal deficit present.     Mental Status: He is alert and oriented to person, place, and time.  Psychiatric:        Mood and Affect: Mood normal.        Behavior: Behavior normal.        Thought Content: Thought content normal.        Judgment: Judgment normal.       Assessment/Plan:  1. 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  Discussed with patient in detail about my thought process and I definitely recommend sigmoid colectomy.  He is in complete agreement with me.  Procedure discussed with patient in detail.  Risks, benefits and possible indications including but not limited to: Bleeding, infection, injury to adjacent structures, pain, need for ostomy. He understands and wishes to proceed.  We will ask urology for injection of ICG into the ureters for appropriate intraoperative navigation   Greater than 50% of the 45 minutes  visit was spent in counseling/coordination of care   Caroleen Hamman, MD [place] Surgeon

## 2020-12-18 ENCOUNTER — Telehealth: Payer: Self-pay | Admitting: Surgery

## 2020-12-18 NOTE — Telephone Encounter (Signed)
Incoming call from patient.  He is aware of all dates regarding his surgery, also voiced understanding with the colon prep.

## 2020-12-18 NOTE — Telephone Encounter (Signed)
Outgoing call is made, left message to call.  Please advise patient of the following:  Patient has been advised of Pre-Admission date/time, COVID Testing date and Surgery date.  Surgery Date: 01/06/21 Preadmission Testing Date: 12/26/20 (phone 8a-1p) Covid Testing Date: 01/02/21 @ 8:35 am, patient will still need to to Covid test and he is surgery admit.  [facility]   Also patient has been made aware to call [phone], between 1-3:00pm the day before surgery, to find out what time to arrive for surgery.

## 2020-12-26 ENCOUNTER — Other Ambulatory Visit
Admission: RE | Admit: 2020-12-26 | Discharge: 2020-12-26 | Disposition: A | Discharge status: Home / Self Care | Payer: BC Managed Care – PPO | Source: Ambulatory Visit | Attending: Surgery | Admitting: Surgery

## 2020-12-26 ENCOUNTER — Other Ambulatory Visit: Payer: Self-pay

## 2020-12-26 HISTORY — DX: Pneumonia, unspecified organism: J18.9

## 2020-12-26 NOTE — Patient Instructions (Addendum)
Your procedure is scheduled on: 01/06/21 - Tuesday Report to the Registration Desk on the 1st floor of the [facility]. To find out your arrival time, please call [phone] between 1PM - 3PM on: 01/05/21-  Monday [facility] to [facility] at 830 am on 01/02/21 for Labs, EKG and Covid Test.  REMEMBER: Instructions that are not followed completely may result in serious medical risk, up to and including death; or upon the discretion of your surgeon and anesthesiologist your surgery may need to be rescheduled.  Do not eat food or drink any fluids after midnight the night before surgery.  No gum chewing, lozengers or hard candies.  - Follow Bowel Prep instructions as directed by Dr.Pabon.  TAKE THESE MEDICATIONS THE MORNING OF SURGERY WITH A SIP OF WATER:  - omeprazole (PRILOSEC) 10 MG capsule, take one the night before and one on the morning of surgery - helps to prevent nausea after surgery.  Follow recommendations from Cardiologist, Pulmonologist or PCP regarding stopping Aspirin, Coumadin, Plavix, Eliquis, Pradaxa, or Pletal. Stop taking on 12/31/20, five days before your surgery.  One week prior to surgery: Stop Anti-inflammatories (NSAIDS) such as Advil, Aleve, Ibuprofen, Motrin, Naproxen, Naprosyn and Aspirin based products such as Excedrin, Goodys Powder, BC Powder.  Stop ANY OVER THE COUNTER supplements until after surgery. Stop taking on 12/30/20- Multiple Vitamin (MULTIVITAMIN) capsule  You may take Tylenol if needed for pain up until the day of surgery.  No Alcohol for 24 hours before or after surgery.  No Smoking including e-cigarettes for 24 hours prior to surgery.  No chewable tobacco products for at least 6 hours prior to surgery.  No nicotine patches on the day of surgery.  Do not use any "recreational" drugs for at least a week prior to your surgery.  Please be advised that the combination of cocaine and anesthesia may have negative outcomes, up to and including  death. If you test positive for cocaine, your surgery will be cancelled.  On the morning of surgery brush your teeth with toothpaste and water, you may rinse your mouth with mouthwash if you wish. Do not swallow any toothpaste or mouthwash.  Do not wear jewelry, make-up, hairpins, clips or nail polish.  Do not wear lotions, powders, or perfumes.   Do not shave body from the neck down 48 hours prior to surgery just in case you cut yourself which could leave a site for infection.  Also, freshly shaved skin may become irritated if using the CHG soap.  Contact lenses, hearing aids and dentures may not be worn into surgery.  Do not bring valuables to the hospital. [facility] is not responsible for any missing/lost belongings or valuables.   Use CHG Soap or wipes as directed on instruction sheet.  Notify your doctor if there is any change in your medical condition (cold, fever, infection).  Wear comfortable clothing (specific to your surgery type) to the hospital.  Plan for stool softeners for home use; pain medications have a tendency to cause constipation. You can also help prevent constipation by eating foods high in fiber such as fruits and vegetables and drinking plenty of fluids as your diet allows.  After surgery, you can help prevent lung complications by doing breathing exercises.  Take deep breaths and cough every 1-2 hours. Your doctor may order a device called an Incentive Spirometer to help you take deep breaths. When coughing or sneezing, hold a pillow firmly against your incision with both hands. This is called "splinting."  Doing this helps protect your incision. It also decreases belly discomfort.  If you are being admitted to the hospital overnight, leave your suitcase in the car. After surgery it may be brought to your room.  If you are being discharged the day of surgery, you will not be allowed to drive home. You will need a responsible adult (18 years or older) to  drive you home and stay with you that night.   If you are taking public transportation, you will need to have a responsible adult (18 years or older) with you. Please confirm with your physician that it is acceptable to use public transportation.   Please call the [facility] at [phone] if you have any questions about these instructions.  Surgery Visitation Policy:  Patients undergoing a surgery or procedure may have one family member or support person with them as long as that person is not COVID-19 positive or experiencing its symptoms.  That person may remain in the waiting area during the procedure.  Inpatient Visitation:    Visiting hours are 7 a.m. to 8 p.m. Inpatients will be allowed two visitors daily. The visitors may change each day during the patient's stay. No visitors under the age of 10. Any visitor under the age of 28 must be accompanied by an adult. The visitor must pass COVID-19 screenings, use hand sanitizer when entering and exiting the patient's room and wear a mask at all times, including in the patient's room. Patients must also wear a mask when staff or their visitor are in the room. Masking is required regardless of vaccination status.

## 2021-01-02 ENCOUNTER — Other Ambulatory Visit: Payer: Self-pay

## 2021-01-02 ENCOUNTER — Other Ambulatory Visit
Admission: RE | Admit: 2021-01-02 | Discharge: 2021-01-02 | Disposition: A | Discharge status: Home / Self Care | Payer: BC Managed Care – PPO | Source: Ambulatory Visit | Attending: Surgery | Admitting: Surgery

## 2021-01-02 DIAGNOSIS — Z20822 Contact with and (suspected) exposure to covid-19: Secondary | ICD-10-CM | POA: Insufficient documentation

## 2021-01-02 DIAGNOSIS — Z01818 Encounter for other preprocedural examination: Secondary | ICD-10-CM | POA: Insufficient documentation

## 2021-01-02 LAB — CBC
HCT: 50 % (ref 39.0–52.0)
Hemoglobin: 17.2 g/dL — ABNORMAL HIGH (ref 13.0–17.0)
MCH: 29.7 pg (ref 26.0–34.0)
MCHC: 34.4 g/dL (ref 30.0–36.0)
MCV: 86.2 fL (ref 80.0–100.0)
Platelets: 310 10*3/uL (ref 150–400)
RBC: 5.8 MIL/uL (ref 4.22–5.81)
RDW: 14.3 % (ref 11.5–15.5)
WBC: 9.7 10*3/uL (ref 4.0–10.5)
nRBC: 0 % (ref 0.0–0.2)

## 2021-01-02 LAB — PROTIME-INR
INR: 1 (ref 0.8–1.2)
Prothrombin Time: 12.9 seconds (ref 11.4–15.2)

## 2021-01-02 LAB — SARS CORONAVIRUS 2 (TAT 6-24 HRS): SARS Coronavirus 2: NEGATIVE

## 2021-01-02 LAB — TYPE AND SCREEN
ABO/RH(D): O POS
Antibody Screen: NEGATIVE

## 2021-01-02 LAB — APTT: aPTT: 31 seconds (ref 24–36)

## 2021-01-05 MED ORDER — CHLORHEXIDINE GLUCONATE CLOTH 2 % EX PADS: 6.0000 | MEDICATED_PAD | Freq: Once | CUTANEOUS | Status: DC

## 2021-01-05 MED ORDER — CELECOXIB 200 MG PO CAPS: 200.0000 mg | ORAL_CAPSULE | ORAL | Status: AC

## 2021-01-05 MED ORDER — ACETAMINOPHEN 500 MG PO TABS: 1000.0000 mg | ORAL_TABLET | ORAL | Status: AC

## 2021-01-05 MED ORDER — CHLORHEXIDINE GLUCONATE 0.12 % MT SOLN: 15.0000 mL | Freq: Once | OROMUCOSAL | Status: AC

## 2021-01-05 MED ORDER — ORAL CARE MOUTH RINSE: 15.0000 mL | Freq: Once | OROMUCOSAL | Status: AC

## 2021-01-05 MED ORDER — GABAPENTIN 300 MG PO CAPS: 300.0000 mg | ORAL_CAPSULE | ORAL | Status: AC

## 2021-01-05 MED ORDER — SODIUM CHLORIDE 0.9 % IV SOLN
2.0000 g | INTRAVENOUS | Status: AC
  Administered 2021-01-06: 2 g via INTRAVENOUS

## 2021-01-05 MED ORDER — HEPARIN SODIUM (PORCINE) 5000 UNIT/ML IJ SOLN: 5000.0000 [IU] | Freq: Once | INTRAMUSCULAR | Status: AC

## 2021-01-05 MED ORDER — ALVIMOPAN 12 MG PO CAPS: 12.0000 mg | ORAL_CAPSULE | ORAL | Status: AC

## 2021-01-05 MED ORDER — LACTATED RINGERS IV SOLN: INTRAVENOUS | Status: DC

## 2021-01-06 ENCOUNTER — Encounter
Admission: RE | Disposition: A | Discharge status: Home / Self Care | Payer: Self-pay | Source: Home / Self Care | Attending: Surgery

## 2021-01-06 ENCOUNTER — Inpatient Hospital Stay
Admission: RE | Admit: 2021-01-06 | Discharge: 2021-01-08 | DRG: 330 | Disposition: A | Discharge status: Home / Self Care | Payer: BC Managed Care – PPO | Attending: Surgery | Admitting: Surgery

## 2021-01-06 ENCOUNTER — Other Ambulatory Visit: Payer: Self-pay

## 2021-01-06 ENCOUNTER — Inpatient Hospital Stay: Payer: BC Managed Care – PPO | Admitting: Certified Registered"

## 2021-01-06 ENCOUNTER — Encounter: Payer: Self-pay | Admitting: Surgery

## 2021-01-06 DIAGNOSIS — K5732 Diverticulitis of large intestine without perforation or abscess without bleeding: Principal | ICD-10-CM | POA: Diagnosis present

## 2021-01-06 DIAGNOSIS — N3289 Other specified disorders of bladder: Secondary | ICD-10-CM | POA: Diagnosis present

## 2021-01-06 DIAGNOSIS — K219 Gastro-esophageal reflux disease without esophagitis: Secondary | ICD-10-CM | POA: Diagnosis present

## 2021-01-06 DIAGNOSIS — F410 Panic disorder [episodic paroxysmal anxiety] without agoraphobia: Secondary | ICD-10-CM | POA: Diagnosis present

## 2021-01-06 DIAGNOSIS — N390 Urinary tract infection, site not specified: Secondary | ICD-10-CM | POA: Diagnosis present

## 2021-01-06 DIAGNOSIS — Z87440 Personal history of urinary (tract) infections: Secondary | ICD-10-CM

## 2021-01-06 DIAGNOSIS — N321 Vesicointestinal fistula: Secondary | ICD-10-CM | POA: Diagnosis present

## 2021-01-06 DIAGNOSIS — F17210 Nicotine dependence, cigarettes, uncomplicated: Secondary | ICD-10-CM | POA: Diagnosis present

## 2021-01-06 DIAGNOSIS — Z889 Allergy status to unspecified drugs, medicaments and biological substances status: Secondary | ICD-10-CM

## 2021-01-06 DIAGNOSIS — Z9049 Acquired absence of other specified parts of digestive tract: Secondary | ICD-10-CM

## 2021-01-06 DIAGNOSIS — K5792 Diverticulitis of intestine, part unspecified, without perforation or abscess without bleeding: Secondary | ICD-10-CM | POA: Diagnosis not present

## 2021-01-06 LAB — HIV ANTIBODY (ROUTINE TESTING W REFLEX): HIV Screen 4th Generation wRfx: NONREACTIVE

## 2021-01-06 LAB — CBC
HCT: 50.7 % (ref 39.0–52.0)
Hemoglobin: 17.7 g/dL — ABNORMAL HIGH (ref 13.0–17.0)
MCH: 30.1 pg (ref 26.0–34.0)
MCHC: 34.9 g/dL (ref 30.0–36.0)
MCV: 86.2 fL (ref 80.0–100.0)
Platelets: 328 10*3/uL (ref 150–400)
RBC: 5.88 MIL/uL — ABNORMAL HIGH (ref 4.22–5.81)
RDW: 13.9 % (ref 11.5–15.5)
WBC: 22.6 10*3/uL — ABNORMAL HIGH (ref 4.0–10.5)
nRBC: 0 % (ref 0.0–0.2)

## 2021-01-06 LAB — CREATININE, SERUM
Creatinine, Ser: 1.3 mg/dL — ABNORMAL HIGH (ref 0.61–1.24)
GFR, Estimated: >60 mL/min (ref >60)

## 2021-01-06 LAB — URINE DRUG SCREEN, QUALITATIVE (ARMC ONLY)
Amphetamines, Ur Screen: NOT DETECTED
Barbiturates, Ur Screen: NOT DETECTED
Benzodiazepine, Ur Scrn: NOT DETECTED
Cocaine Metabolite,Ur ~~LOC~~: NOT DETECTED
MDMA (Ecstasy)Ur Screen: NOT DETECTED
Methadone Scn, Ur: NOT DETECTED
Opiate, Ur Screen: NOT DETECTED
Phencyclidine (PCP) Ur S: NOT DETECTED
Tricyclic, Ur Screen: NOT DETECTED

## 2021-01-06 LAB — ABO/RH: ABO/RH(D): O POS

## 2021-01-06 SURGERY — COLECTOMY, SIGMOID, ROBOT-ASSISTED
Anesthesia: General | Site: Abdomen

## 2021-01-06 MED ORDER — METHYLENE BLUE 0.5 % INJ SOLN
INTRAVENOUS | Status: DC | PRN
  Administered 2021-01-06: 1 mL

## 2021-01-06 MED ORDER — KETOROLAC TROMETHAMINE 30 MG/ML IJ SOLN
30.0000 mg | Freq: Four times a day (QID) | INTRAMUSCULAR | Status: DC
  Administered 2021-01-06 – 2021-01-08 (×7): 30 mg via INTRAVENOUS
  Filled 2021-01-06 (×7): qty 1

## 2021-01-06 MED ORDER — KETAMINE HCL 50 MG/ML IJ SOLN
INTRAMUSCULAR | Status: AC
  Filled 2021-01-06: qty 1

## 2021-01-06 MED ORDER — ROCURONIUM BROMIDE 100 MG/10ML IV SOLN
INTRAVENOUS | Status: DC | PRN
  Administered 2021-01-06: 30 mg via INTRAVENOUS
  Administered 2021-01-06: 50 mg via INTRAVENOUS
  Administered 2021-01-06: 30 mg via INTRAVENOUS
  Administered 2021-01-06 (×2): 20 mg via INTRAVENOUS
  Administered 2021-01-06: 30 mg via INTRAVENOUS
  Administered 2021-01-06: 20 mg via INTRAVENOUS

## 2021-01-06 MED ORDER — ONDANSETRON 4 MG PO TBDP: 4.0000 mg | ORAL_TABLET | Freq: Four times a day (QID) | ORAL | Status: DC | PRN

## 2021-01-06 MED ORDER — MIDAZOLAM HCL 2 MG/2ML IJ SOLN
INTRAMUSCULAR | Status: DC | PRN
  Administered 2021-01-06: 2 mg via INTRAVENOUS

## 2021-01-06 MED ORDER — ROCURONIUM BROMIDE 10 MG/ML (PF) SYRINGE
PREFILLED_SYRINGE | INTRAVENOUS | Status: AC
  Filled 2021-01-06: qty 10

## 2021-01-06 MED ORDER — METRONIDAZOLE 500 MG/100ML IV SOLN
500.0000 mg | Freq: Once | INTRAVENOUS | Status: AC
  Administered 2021-01-06: 500 mg via INTRAVENOUS
  Filled 2021-01-06: qty 100

## 2021-01-06 MED ORDER — BUPIVACAINE-EPINEPHRINE 0.25% -1:200000 IJ SOLN
INTRAMUSCULAR | Status: DC | PRN
  Administered 2021-01-06: 30 mL

## 2021-01-06 MED ORDER — DIPHENHYDRAMINE HCL 50 MG/ML IJ SOLN: 25.0000 mg | Freq: Four times a day (QID) | INTRAMUSCULAR | Status: DC | PRN

## 2021-01-06 MED ORDER — KETAMINE HCL 10 MG/ML IJ SOLN
INTRAMUSCULAR | Status: DC | PRN
  Administered 2021-01-06 (×5): 20 mg via INTRAVENOUS

## 2021-01-06 MED ORDER — DEXAMETHASONE SODIUM PHOSPHATE 10 MG/ML IJ SOLN
INTRAMUSCULAR | Status: AC
  Filled 2021-01-06: qty 1

## 2021-01-06 MED ORDER — ONDANSETRON HCL 4 MG/2ML IJ SOLN: 4.0000 mg | Freq: Four times a day (QID) | INTRAMUSCULAR | Status: DC | PRN

## 2021-01-06 MED ORDER — CHLORHEXIDINE GLUCONATE 0.12 % MT SOLN
OROMUCOSAL | Status: AC
  Administered 2021-01-06: 15 mL via OROMUCOSAL
  Filled 2021-01-06: qty 15

## 2021-01-06 MED ORDER — SODIUM CHLORIDE FLUSH 0.9 % IV SOLN
INTRAVENOUS | Status: AC
  Filled 2021-01-06: qty 10

## 2021-01-06 MED ORDER — FENTANYL CITRATE (PF) 100 MCG/2ML IJ SOLN
INTRAMUSCULAR | Status: DC | PRN
  Administered 2021-01-06 (×3): 50 ug via INTRAVENOUS

## 2021-01-06 MED ORDER — MORPHINE SULFATE (PF) 2 MG/ML IV SOLN
2.0000 mg | INTRAVENOUS | Status: DC | PRN
  Administered 2021-01-06: 2 mg via INTRAVENOUS
  Filled 2021-01-06: qty 1

## 2021-01-06 MED ORDER — CHLORHEXIDINE GLUCONATE CLOTH 2 % EX PADS: 6.0000 | MEDICATED_PAD | Freq: Every day | CUTANEOUS | Status: DC

## 2021-01-06 MED ORDER — METHYLENE BLUE 0.5 % INJ SOLN
INTRAVENOUS | Status: AC
  Filled 2021-01-06: qty 10

## 2021-01-06 MED ORDER — CEFAZOLIN SODIUM 1 G IJ SOLR
INTRAMUSCULAR | Status: AC
  Filled 2021-01-06: qty 20

## 2021-01-06 MED ORDER — LIDOCAINE HCL (PF) 2 % IJ SOLN
INTRAMUSCULAR | Status: AC
  Filled 2021-01-06: qty 10

## 2021-01-06 MED ORDER — PROCHLORPERAZINE MALEATE 10 MG PO TABS
10.0000 mg | ORAL_TABLET | Freq: Four times a day (QID) | ORAL | Status: DC | PRN
  Filled 2021-01-06: qty 1

## 2021-01-06 MED ORDER — SODIUM CHLORIDE 0.9 % IV SOLN
INTRAVENOUS | Status: DC | PRN
  Administered 2021-01-06: 70 mL

## 2021-01-06 MED ORDER — ALBUMIN HUMAN 5 % IV SOLN
INTRAVENOUS | Status: AC
  Filled 2021-01-06: qty 500

## 2021-01-06 MED ORDER — ONDANSETRON HCL 4 MG/2ML IJ SOLN
INTRAMUSCULAR | Status: AC
  Filled 2021-01-06: qty 2

## 2021-01-06 MED ORDER — HEPARIN SODIUM (PORCINE) 5000 UNIT/ML IJ SOLN
INTRAMUSCULAR | Status: AC
  Administered 2021-01-06: 5000 [IU] via SUBCUTANEOUS
  Filled 2021-01-06: qty 1

## 2021-01-06 MED ORDER — BSS IO SOLN
15.0000 mL | Freq: Once | INTRAOCULAR | Status: AC
Start: 2021-01-06 — End: 2021-01-06
  Administered 2021-01-06: 15 mL
  Filled 2021-01-06: qty 15

## 2021-01-06 MED ORDER — CEFAZOLIN SODIUM-DEXTROSE 2-4 GM/100ML-% IV SOLN
2.0000 g | Freq: Three times a day (TID) | INTRAVENOUS | Status: AC
  Administered 2021-01-06 – 2021-01-07 (×3): 2 g via INTRAVENOUS
  Filled 2021-01-06 (×4): qty 100

## 2021-01-06 MED ORDER — DEXAMETHASONE SODIUM PHOSPHATE 10 MG/ML IJ SOLN
INTRAMUSCULAR | Status: DC | PRN
  Administered 2021-01-06: 10 mg via INTRAVENOUS

## 2021-01-06 MED ORDER — METRONIDAZOLE 500 MG/100ML IV SOLN
500.0000 mg | Freq: Three times a day (TID) | INTRAVENOUS | Status: AC
  Administered 2021-01-06 – 2021-01-07 (×3): 500 mg via INTRAVENOUS
  Filled 2021-01-06 (×3): qty 100

## 2021-01-06 MED ORDER — SODIUM CHLORIDE (PF) 0.9 % IJ SOLN
INTRAMUSCULAR | Status: AC
  Filled 2021-01-06: qty 10

## 2021-01-06 MED ORDER — PROPOFOL 10 MG/ML IV BOLUS
INTRAVENOUS | Status: DC | PRN
  Administered 2021-01-06: 170 mg via INTRAVENOUS

## 2021-01-06 MED ORDER — FENTANYL CITRATE (PF) 100 MCG/2ML IJ SOLN
INTRAMUSCULAR | Status: AC
  Administered 2021-01-06: 50 ug via INTRAVENOUS
  Filled 2021-01-06: qty 2

## 2021-01-06 MED ORDER — BUPIVACAINE-EPINEPHRINE (PF) 0.25% -1:200000 IJ SOLN
INTRAMUSCULAR | Status: AC
  Filled 2021-01-06: qty 30

## 2021-01-06 MED ORDER — LIDOCAINE HCL (PF) 2 % IJ SOLN
INTRAMUSCULAR | Status: DC | PRN
  Administered 2021-01-06: .5 mg/kg/h via INTRADERMAL

## 2021-01-06 MED ORDER — DEXMEDETOMIDINE (PRECEDEX) IN NS 20 MCG/5ML (4 MCG/ML) IV SYRINGE
PREFILLED_SYRINGE | INTRAVENOUS | Status: DC | PRN
Start: 2021-01-06 — End: 2021-01-06
  Administered 2021-01-06: 4 ug via INTRAVENOUS
  Administered 2021-01-06 (×2): 8 ug via INTRAVENOUS

## 2021-01-06 MED ORDER — SODIUM CHLORIDE FLUSH 0.9 % IV SOLN
INTRAVENOUS | Status: AC
  Filled 2021-01-06: qty 50

## 2021-01-06 MED ORDER — LIDOCAINE HCL (CARDIAC) PF 100 MG/5ML IV SOSY
PREFILLED_SYRINGE | INTRAVENOUS | Status: DC | PRN
  Administered 2021-01-06: 60 mg via INTRAVENOUS
  Administered 2021-01-06: 20 mg via INTRAVENOUS

## 2021-01-06 MED ORDER — FENTANYL CITRATE (PF) 250 MCG/5ML IJ SOLN
INTRAMUSCULAR | Status: AC
  Filled 2021-01-06: qty 5

## 2021-01-06 MED ORDER — ONDANSETRON HCL 4 MG/2ML IJ SOLN
4.0000 mg | Freq: Once | INTRAMUSCULAR | Status: DC | PRN
Start: 2021-01-06 — End: 2021-01-06

## 2021-01-06 MED ORDER — MIDAZOLAM HCL 2 MG/2ML IJ SOLN
INTRAMUSCULAR | Status: AC
  Filled 2021-01-06: qty 2

## 2021-01-06 MED ORDER — SODIUM CHLORIDE 0.9 % IV SOLN
INTRAVENOUS | Status: AC
  Filled 2021-01-06: qty 2

## 2021-01-06 MED ORDER — INDOCYANINE GREEN 25 MG IV SOLR
INTRAVENOUS | Status: DC | PRN
  Administered 2021-01-06: 5 mg via INTRAVENOUS

## 2021-01-06 MED ORDER — CEFAZOLIN SODIUM-DEXTROSE 2-3 GM-%(50ML) IV SOLR
INTRAVENOUS | Status: DC | PRN
  Administered 2021-01-06: 2 g via INTRAVENOUS

## 2021-01-06 MED ORDER — SODIUM CHLORIDE 0.9 % IV SOLN: INTRAVENOUS | Status: DC

## 2021-01-06 MED ORDER — ACETAMINOPHEN 500 MG PO TABS
ORAL_TABLET | ORAL | Status: AC
  Administered 2021-01-06: 1000 mg via ORAL
  Filled 2021-01-06: qty 2

## 2021-01-06 MED ORDER — OXYCODONE HCL 5 MG PO TABS
5.0000 mg | ORAL_TABLET | ORAL | Status: DC | PRN
  Administered 2021-01-07: 10 mg via ORAL
  Filled 2021-01-06: qty 2

## 2021-01-06 MED ORDER — PHENYLEPHRINE HCL (PRESSORS) 10 MG/ML IV SOLN
INTRAVENOUS | Status: DC | PRN
  Administered 2021-01-06: 200 ug via INTRAVENOUS
  Administered 2021-01-06 (×4): 100 ug via INTRAVENOUS

## 2021-01-06 MED ORDER — FENTANYL CITRATE (PF) 100 MCG/2ML IJ SOLN: 50.0000 ug | Freq: Once | INTRAMUSCULAR | Status: AC

## 2021-01-06 MED ORDER — PROCHLORPERAZINE EDISYLATE 10 MG/2ML IJ SOLN: 5.0000 mg | Freq: Four times a day (QID) | INTRAMUSCULAR | Status: DC | PRN

## 2021-01-06 MED ORDER — SUGAMMADEX SODIUM 200 MG/2ML IV SOLN
INTRAVENOUS | Status: DC | PRN
  Administered 2021-01-06: 200 mg via INTRAVENOUS

## 2021-01-06 MED ORDER — PREGABALIN 75 MG PO CAPS
75.0000 mg | ORAL_CAPSULE | Freq: Three times a day (TID) | ORAL | Status: DC
  Administered 2021-01-06 – 2021-01-08 (×5): 75 mg via ORAL
  Filled 2021-01-06 (×5): qty 1

## 2021-01-06 MED ORDER — LORAZEPAM 0.5 MG PO TABS: 0.5000 mg | ORAL_TABLET | Freq: Every day | ORAL | Status: DC | PRN

## 2021-01-06 MED ORDER — INDOCYANINE GREEN 25 MG IV SOLR
INTRAVENOUS | Status: DC | PRN
  Administered 2021-01-06 (×3): 10 mg

## 2021-01-06 MED ORDER — SODIUM CHLORIDE 0.9 % IV SOLN
INTRAVENOUS | Status: DC | PRN
  Administered 2021-01-06: 50 ug/min via INTRAVENOUS

## 2021-01-06 MED ORDER — PANTOPRAZOLE SODIUM 40 MG IV SOLR
40.0000 mg | Freq: Every day | INTRAVENOUS | Status: DC
  Administered 2021-01-06 – 2021-01-07 (×2): 40 mg via INTRAVENOUS
  Filled 2021-01-06 (×2): qty 40

## 2021-01-06 MED ORDER — ALBUMIN HUMAN 5 % IV SOLN: INTRAVENOUS | Status: DC | PRN

## 2021-01-06 MED ORDER — ALVIMOPAN 12 MG PO CAPS
12.0000 mg | ORAL_CAPSULE | Freq: Every day | ORAL | Status: DC
  Filled 2021-01-06: qty 1

## 2021-01-06 MED ORDER — FENTANYL CITRATE (PF) 100 MCG/2ML IJ SOLN
INTRAMUSCULAR | Status: AC
  Administered 2021-01-06: 25 ug via INTRAVENOUS
  Filled 2021-01-06: qty 2

## 2021-01-06 MED ORDER — ACETAMINOPHEN 500 MG PO TABS
1000.0000 mg | ORAL_TABLET | Freq: Four times a day (QID) | ORAL | Status: DC
  Administered 2021-01-06 – 2021-01-08 (×7): 1000 mg via ORAL
  Filled 2021-01-06 (×7): qty 2

## 2021-01-06 MED ORDER — FENTANYL CITRATE (PF) 100 MCG/2ML IJ SOLN
25.0000 ug | INTRAMUSCULAR | Status: DC | PRN
  Administered 2021-01-06 (×5): 25 ug via INTRAVENOUS

## 2021-01-06 MED ORDER — ONDANSETRON HCL 4 MG/2ML IJ SOLN
INTRAMUSCULAR | Status: DC | PRN
  Administered 2021-01-06: 4 mg via INTRAVENOUS

## 2021-01-06 MED ORDER — ENOXAPARIN SODIUM 40 MG/0.4ML IJ SOSY
40.0000 mg | PREFILLED_SYRINGE | INTRAMUSCULAR | Status: DC
  Administered 2021-01-07: 40 mg via SUBCUTANEOUS
  Filled 2021-01-06: qty 0.4

## 2021-01-06 MED ORDER — LIDOCAINE HCL (PF) 2 % IJ SOLN
INTRAMUSCULAR | Status: AC
  Filled 2021-01-06: qty 15

## 2021-01-06 MED ORDER — BUPIVACAINE LIPOSOME 1.3 % IJ SUSP
INTRAMUSCULAR | Status: AC
  Filled 2021-01-06: qty 20

## 2021-01-06 MED ORDER — METHOCARBAMOL 500 MG PO TABS
500.0000 mg | ORAL_TABLET | Freq: Four times a day (QID) | ORAL | Status: DC | PRN
  Administered 2021-01-07: 500 mg via ORAL
  Filled 2021-01-06: qty 1

## 2021-01-06 MED ORDER — ALVIMOPAN 12 MG PO CAPS
ORAL_CAPSULE | ORAL | Status: AC
  Administered 2021-01-06: 12 mg via ORAL
  Filled 2021-01-06: qty 1

## 2021-01-06 MED ORDER — CELECOXIB 200 MG PO CAPS
ORAL_CAPSULE | ORAL | Status: AC
  Administered 2021-01-06: 200 mg via ORAL
  Filled 2021-01-06: qty 1

## 2021-01-06 MED ORDER — DIPHENHYDRAMINE HCL 25 MG PO CAPS: 25.0000 mg | ORAL_CAPSULE | Freq: Four times a day (QID) | ORAL | Status: DC | PRN

## 2021-01-06 MED ORDER — GABAPENTIN 300 MG PO CAPS
ORAL_CAPSULE | ORAL | Status: AC
  Administered 2021-01-06: 300 mg via ORAL
  Filled 2021-01-06: qty 1

## 2021-01-06 MED ORDER — LACTATED RINGERS IV SOLN: INTRAVENOUS | Status: DC | PRN

## 2021-01-06 MED ORDER — DEXMEDETOMIDINE (PRECEDEX) IN NS 20 MCG/5ML (4 MCG/ML) IV SYRINGE
PREFILLED_SYRINGE | INTRAVENOUS | Status: AC
  Filled 2021-01-06: qty 5

## 2021-01-06 SURGICAL SUPPLY — 118 items
BAG DRAIN CYSTO-URO LG1000N (MISCELLANEOUS) ×4 IMPLANT
BAG LAPAROSCOPIC 12 15 PORT 16 (BASKET) ×2 IMPLANT
BAG RETRIEVAL 12/15 (BASKET) ×3
BAG RETRIEVAL 12/15MM (BASKET) ×1
BULB RESERV EVAC DRAIN JP 100C (MISCELLANEOUS) ×4 IMPLANT
CANISTER SUCT 1200ML W/VALVE (MISCELLANEOUS) IMPLANT
CANNULA REDUC XI 12-8 STAPL (CANNULA) ×1
CANNULA REDUC XI 12-8MM STAPL (CANNULA) ×1
CANNULA REDUCER 12-8 DVNC XI (CANNULA) ×2 IMPLANT
CATH FOLEY 2WAY  5CC 16FR (CATHETERS) ×2
CATH ROBINSON RED A/P 16FR (CATHETERS) ×4 IMPLANT
CATH URETL 5X70 OPEN END (CATHETERS) ×4 IMPLANT
CATH URTH 16FR FL 2W BLN LF (CATHETERS) ×2 IMPLANT
CHLORAPREP W/TINT 26 (MISCELLANEOUS) ×4 IMPLANT
CLIP VESOLOCK LG 6/CT PURPLE (CLIP) IMPLANT
CLIP VESOLOCK MED LG 6/CT (CLIP) IMPLANT
COVER MAYO STAND REUSABLE (DRAPES) ×4 IMPLANT
COVER WAND RF STERILE (DRAPES) ×4 IMPLANT
DECANTER SPIKE VIAL GLASS SM (MISCELLANEOUS) ×4 IMPLANT
DEFOGGER SCOPE WARMER CLEARIFY (MISCELLANEOUS) ×4 IMPLANT
DERMABOND ADVANCED (GAUZE/BANDAGES/DRESSINGS) ×6
DERMABOND ADVANCED .7 DNX12 (GAUZE/BANDAGES/DRESSINGS) ×6 IMPLANT
DRAIN CHANNEL JP 19F (MISCELLANEOUS) ×4 IMPLANT
DRAPE 3/4 80X56 (DRAPES) ×4 IMPLANT
DRAPE ARM DVNC X/XI (DISPOSABLE) ×8 IMPLANT
DRAPE COLUMN DVNC XI (DISPOSABLE) ×2 IMPLANT
DRAPE DA VINCI XI ARM (DISPOSABLE) ×8
DRAPE DA VINCI XI COLUMN (DISPOSABLE) ×2
DRAPE LEGGINS SURG 28X43 STRL (DRAPES) ×4 IMPLANT
DRAPE UNDER BUTTOCK W/FLU (DRAPES) ×4 IMPLANT
DRSG TEGADERM 4X4.75 (GAUZE/BANDAGES/DRESSINGS) ×4 IMPLANT
ELECT BLADE 6.5 EXT (BLADE) ×4 IMPLANT
ELECT CAUTERY BLADE 6.4 (BLADE) ×4 IMPLANT
ELECT REM PT RETURN 9FT ADLT (ELECTROSURGICAL) ×4
ELECTRODE REM PT RTRN 9FT ADLT (ELECTROSURGICAL) ×2 IMPLANT
GLOVE SURG ENC MOIS LTX SZ7 (GLOVE) ×20 IMPLANT
GLOVE SURG UNDER POLY LF SZ7.5 (GLOVE) ×12 IMPLANT
GOWN STRL REUS W/ TWL LRG LVL3 (GOWN DISPOSABLE) ×20 IMPLANT
GOWN STRL REUS W/TWL LRG LVL3 (GOWN DISPOSABLE) ×20
GRASPER LAPSCPC 5X45 DSP (INSTRUMENTS) ×4 IMPLANT
GUIDEWIRE STR DUAL SENSOR (WIRE) ×4 IMPLANT
HANDLE YANKAUER SUCT BULB TIP (MISCELLANEOUS) ×4 IMPLANT
IRRIGATION STRYKERFLOW (MISCELLANEOUS) ×2 IMPLANT
IRRIGATOR STRYKERFLOW (MISCELLANEOUS) ×4
IV NS 1000ML (IV SOLUTION) ×2
IV NS 1000ML BAXH (IV SOLUTION) ×2 IMPLANT
IV NS IRRIG 3000ML ARTHROMATIC (IV SOLUTION) ×4 IMPLANT
KIT IMAGING PINPOINTPAQ (MISCELLANEOUS) ×16 IMPLANT
KIT PINK PAD W/HEAD ARE REST (MISCELLANEOUS) ×4
KIT PINK PAD W/HEAD ARM REST (MISCELLANEOUS) ×2 IMPLANT
LABEL OR SOLS (LABEL) ×4 IMPLANT
MANIFOLD NEPTUNE II (INSTRUMENTS) ×8 IMPLANT
NEEDLE HYPO 22GX1.5 SAFETY (NEEDLE) ×4 IMPLANT
NS IRRIG 500ML POUR BTL (IV SOLUTION) ×4 IMPLANT
OBTURATOR OPTICAL STANDARD 8MM (TROCAR) ×2
OBTURATOR OPTICAL STND 8 DVNC (TROCAR) ×2
OBTURATOR OPTICALSTD 8 DVNC (TROCAR) ×2 IMPLANT
PACK COLON CLEAN CLOSURE (MISCELLANEOUS) ×4 IMPLANT
PACK CYSTO AR (MISCELLANEOUS) ×4 IMPLANT
PACK LAP CHOLECYSTECTOMY (MISCELLANEOUS) ×4 IMPLANT
PAD PREP 24X41 OB/GYN DISP (PERSONAL CARE ITEMS) IMPLANT
PENCIL ELECTRO HAND CTR (MISCELLANEOUS) ×4 IMPLANT
PORT ACCESS TROCAR AIRSEAL 5 (TROCAR) ×4 IMPLANT
RELOAD STAPLER 2.5X60 WHT DVNC (STAPLE) IMPLANT
RELOAD STAPLER 3.5X45 BLU DVNC (STAPLE) IMPLANT
RELOAD STAPLER 3.5X60 BLU DVNC (STAPLE) ×2 IMPLANT
SEAL CANN UNIV 5-8 DVNC XI (MISCELLANEOUS) ×6 IMPLANT
SEAL XI 5MM-8MM UNIVERSAL (MISCELLANEOUS) ×6
SEALER VESSEL DA VINCI XI (MISCELLANEOUS) ×2
SEALER VESSEL EXT DVNC XI (MISCELLANEOUS) ×2 IMPLANT
SET CYSTO W/LG BORE CLAMP LF (SET/KITS/TRAYS/PACK) ×4 IMPLANT
SET TRI-LUMEN FLTR TB AIRSEAL (TUBING) ×4 IMPLANT
SOL PREP PVP 2OZ (MISCELLANEOUS) ×4
SOLUTION ELECTROLUBE (MISCELLANEOUS) ×4 IMPLANT
SOLUTION PREP PVP 2OZ (MISCELLANEOUS) ×2 IMPLANT
SPONGE DRAIN TRACH 4X4 STRL 2S (GAUZE/BANDAGES/DRESSINGS) ×4 IMPLANT
SPONGE LAP 18X18 RF (DISPOSABLE) ×8 IMPLANT
SPONGE LAP 4X18 RFD (DISPOSABLE) ×4 IMPLANT
STAPLER 45 DA VINCI SURE FORM (STAPLE)
STAPLER 45 SUREFORM DVNC (STAPLE) IMPLANT
STAPLER 60 DA VINCI SURE FORM (STAPLE) ×4
STAPLER 60 SUREFORM DVNC (STAPLE) ×4 IMPLANT
STAPLER CANNULA SEAL DVNC XI (STAPLE) ×2 IMPLANT
STAPLER CANNULA SEAL XI (STAPLE) ×2
STAPLER CIRCULAR MANUAL XL 25 (STAPLE) IMPLANT
STAPLER CIRCULAR MANUAL XL 29 (STAPLE) ×4 IMPLANT
STAPLER CIRCULAR MANUAL XL 33 (STAPLE) IMPLANT
STAPLER RELOAD 2.5X60 WHITE (STAPLE)
STAPLER RELOAD 2.5X60 WHT DVNC (STAPLE)
STAPLER RELOAD 3.5X45 BLU DVNC (STAPLE)
STAPLER RELOAD 3.5X45 BLUE (STAPLE)
STAPLER RELOAD 3.5X60 BLU DVNC (STAPLE) ×2
STAPLER RELOAD 3.5X60 BLUE (STAPLE) ×2
SURGILUBE 2OZ TUBE FLIPTOP (MISCELLANEOUS) ×8 IMPLANT
SUT DVC VLOC 3-0 CL 6 P-12 (SUTURE) ×4 IMPLANT
SUT ETHILON 3-0 FS-10 30 BLK (SUTURE) ×4
SUT MNCRL AB 4-0 PS2 18 (SUTURE) ×4 IMPLANT
SUT PDS AB 0 CT1 27 (SUTURE) ×8 IMPLANT
SUT SILK 2 0 (SUTURE) ×4
SUT SILK 2 0 SH (SUTURE) ×4 IMPLANT
SUT SILK 2 0SH CR/8 30 (SUTURE) ×4 IMPLANT
SUT SILK 2-0 30XBRD TIE 12 (SUTURE) ×4 IMPLANT
SUT VIC AB 2-0 SH 27 (SUTURE) ×4
SUT VIC AB 2-0 SH 27XBRD (SUTURE) ×4 IMPLANT
SUT VICRYL 0 AB UR-6 (SUTURE) ×4 IMPLANT
SUT VLOC 90 S/L VL9 GS22 (SUTURE) ×8 IMPLANT
SUTURE EHLN 3-0 FS-10 30 BLK (SUTURE) ×2 IMPLANT
SYR 10ML LL (SYRINGE) ×8 IMPLANT
SYR 20ML LL LF (SYRINGE) ×8 IMPLANT
SYR TOOMEY IRRIG 70ML (MISCELLANEOUS) ×4
SYRINGE TOOMEY IRRIG 70ML (MISCELLANEOUS) ×2 IMPLANT
SYS TROCAR 1.5-3 SLV ABD GEL (ENDOMECHANICALS) ×4
SYSTEM TROCR 1.5-3 SLV ABD GEL (ENDOMECHANICALS) ×2 IMPLANT
TAPE TRANSPORE STRL 2 31045 (GAUZE/BANDAGES/DRESSINGS) ×4 IMPLANT
TRAY FOLEY MTR SLVR 16FR STAT (SET/KITS/TRAYS/PACK) IMPLANT
TRAY FOLEY SLVR 16FR LF STAT (SET/KITS/TRAYS/PACK) IMPLANT
TROCAR BALLN GELPORT 12X130M (ENDOMECHANICALS) ×4 IMPLANT
WATER STERILE IRR 1000ML POUR (IV SOLUTION) ×4 IMPLANT

## 2021-01-06 NOTE — Transfer of Care (Signed)
Immediate Anesthesia Transfer of Care Note  Patient: Harold Carroll  Procedure(s) Performed: XI ROBOT ASSISTED SIGMOID COLECTOMY, RNFA to assist (N/A Abdomen) INDOCYANINE GREEN FLUORESCENCE IMAGING (ICG) (N/A )  Patient Location: PACU  Anesthesia Type:General  Level of Consciousness: awake, drowsy and patient cooperative  Airway & Oxygen Therapy: Patient Spontanous Breathing and Patient connected to face mask oxygen  Post-op Assessment: Report given to RN and Post -op Vital signs reviewed and stable  Post vital signs: Reviewed and stable  Last Vitals:  Vitals Value Taken Time  BP 132/92 01/06/21 1533  Temp 36.4 C 01/06/21 1530  Pulse 88 01/06/21 1539  Resp 15 01/06/21 1539  SpO2 98 % 01/06/21 1539  Vitals shown include unvalidated device data.  Last Pain:  Vitals:   01/06/21 0851  TempSrc: Temporal  PainSc: 0-No pain      Patients Stated Pain Goal: 0 ([id number])  Complications: No complications documented.

## 2021-01-06 NOTE — Interval H&P Note (Signed)
History and Physical Interval Note:  01/06/2021 8:45 AM  Harold Carroll  has presented today for surgery, with the diagnosis of diverticulitis.  The various methods of treatment have been discussed with the patient and family. After consideration of risks, benefits and other options for treatment, the patient has consented to  Procedure(s): XI ROBOT [place], RNFA to assist (N/A) [place] (ICG) (N/A) as a surgical intervention.  The patient's history has been reviewed, patient examined, no change in status, stable for surgery.  I have reviewed the patient's chart and labs.  Questions were answered to the patient's satisfaction.     [place]

## 2021-01-06 NOTE — Anesthesia Postprocedure Evaluation (Signed)
Anesthesia Post Note  Patient: Harold Carroll  Procedure(s) Performed: XI ROBOT ASSISTED SIGMOID COLECTOMY, RNFA to assist (N/A Abdomen) INDOCYANINE GREEN FLUORESCENCE IMAGING (ICG) (N/A )  Patient location during evaluation: PACU Anesthesia Type: General Level of consciousness: awake and alert Pain management: pain level controlled Vital Signs Assessment: post-procedure vital signs reviewed and stable Respiratory status: spontaneous breathing, nonlabored ventilation, respiratory function stable and patient connected to nasal cannula oxygen Cardiovascular status: blood pressure returned to baseline and stable Postop Assessment: no apparent nausea or vomiting Anesthetic complications: no   No complications documented.   Last Vitals:  Vitals:   01/06/21 1615 01/06/21 1630  BP: (!) 147/88 (!) 147/94  Pulse: 77 80  Resp: 13 12  Temp:    SpO2: 97% 96%    Last Pain:  Vitals:   01/06/21 1630  TempSrc:   PainSc: 5                  Precious Haws Rasean Joos

## 2021-01-06 NOTE — Anesthesia Procedure Notes (Signed)
Procedure Name: Intubation Date/Time: 01/06/2021 9:40 AM Performed by: Jerrye Noble, CRNA Pre-anesthesia Checklist: Patient identified, Emergency Drugs available, Suction available and Patient being monitored Patient Re-evaluated:Patient Re-evaluated prior to induction Oxygen Delivery Method: Circle system utilized Preoxygenation: Pre-oxygenation with 100% oxygen Induction Type: IV induction Ventilation: Mask ventilation without difficulty Laryngoscope Size: McGraph and 4 Grade View: Grade I Tube type: Oral Tube size: 7.5 mm Number of attempts: 1 Airway Equipment and Method: Stylet,  Oral airway and Video-laryngoscopy Placement Confirmation: ETT inserted through vocal cords under direct vision,  positive ETCO2 and breath sounds checked- equal and bilateral Secured at: 23 cm Tube secured with: Tape Dental Injury: Teeth and Oropharynx as per pre-operative assessment

## 2021-01-06 NOTE — Anesthesia Preprocedure Evaluation (Signed)
Anesthesia Evaluation  Patient identified by MRN, date of birth, ID band Patient awake    Reviewed: Allergy & Precautions, H&P , NPO status , Patient's Chart, lab work & pertinent test results, reviewed documented beta blocker date and time   Airway Mallampati: III  TM Distance: >3 FB Neck ROM: full    Dental  (+) Teeth Intact, Poor Dentition, Missing   Pulmonary pneumonia, resolved, Current Smoker and Patient abstained from smoking.,    Pulmonary exam normal        Cardiovascular Exercise Tolerance: Good negative cardio ROS Normal cardiovascular exam Rhythm:regular Rate:Normal     Neuro/Psych Anxiety negative neurological ROS  negative psych ROS   GI/Hepatic Neg liver ROS, GERD  Medicated,  Endo/Other  negative endocrine ROS  Renal/GU negative Renal ROS  negative genitourinary   Musculoskeletal   Abdominal   Peds  Hematology   Anesthesia Other Findings Past Medical History: No date: GERD (gastroesophageal reflux disease) No date: Panic attacks No date: Pneumonia     Comment:  2018 No date: Polycythemia Past Surgical History: 06/18/2019: COLONOSCOPY WITH PROPOFOL; N/A     Comment:  Procedure: COLONOSCOPY WITH BIOPSY;  Surgeon: Lucilla Lame, MD;  Location: [place];  Service:               Endoscopy;  Laterality: N/A; No date: HERNIA REPAIR 10/28/2015: INGUINAL HERNIA REPAIR; Left     Comment:  Procedure: HERNIA REPAIR INGUINAL ADULT;  Surgeon:               Hubbard Robinson, MD;  Location: [facility];  Service:               General;  Laterality: Left; 06/18/2019: POLYPECTOMY     Comment:  Procedure: POLYPECTOMY;  Surgeon: Lucilla Lame, MD;                Location: [place];  Service: Endoscopy;; No date: SKIN SURGERY     Comment:  Tumor-benign No date: TONSILLECTOMY AND ADENOIDECTOMY No date: TUMOR REMOVAL 1968: VENTRAL HERNIA REPAIR     Comment:  Dr. Mare Ferrari BMI     Body Mass Index: 31.19 kg/m     Reproductive/Obstetrics negative OB ROS                             Anesthesia Physical Anesthesia Plan  ASA: III  Anesthesia Plan: General ETT   Post-op Pain Management:    Induction:   PONV Risk Score and Plan: 2  Airway Management Planned:   Additional Equipment:   Intra-op Plan:   Post-operative Plan:   Informed Consent: I have reviewed the patients History and Physical, chart, labs and discussed the procedure including the risks, benefits and alternatives for the proposed anesthesia with the patient or authorized representative who has indicated his/her understanding and acceptance.     Dental Advisory Given  Plan Discussed with: CRNA  Anesthesia Plan Comments:         Anesthesia Quick Evaluation

## 2021-01-06 NOTE — Op Note (Signed)
PROCEDURES: 1. Robotic assisted Laparoscopic Low anterior resection 2. Robotic Laparoscopic takedown of splenic flexure 3. perfusion check of graft anastomosis using ICG   Pre-operative Diagnosis: Recurrent diverticulitis with colovesical fistula  Post-operative Diagnosis: same  Surgeon: [place]   Assistants: Darci Needle [facility]. ( required for the EEA anastomosis and for exposure)  Anesthesia: General endotracheal anesthesia  ASA Class: 2   Surgeon: Caroleen Hamman , MD FACS  Anesthesia: Gen. with endotracheal tube   Findings: Adhesions from the sigmoid to the pelvic wall Tension free anastomosis with very good perfusion and negative intraoperative leak Colovesical fistula , no evidence of icg or methylene blue extravasaton upon inflation of the urinary bladder    Estimated Blood Loss: 50cc                Specimens: colon       Complications: none         Condition: stable  Procedure Details  The patient was seen again in the Holding Room. The benefits, complications, treatment options, and expected outcomes were discussed with the patient. The risks of bleeding, infection, recurrence of symptoms, failure to resolve symptoms, anastomotic leak, bowel injury, any of which could require further surgery were reviewed with the patient.   The patient was taken to Operating Room, identified  and the procedure verified.  A Time Out was held and the above information confirmed.  Prior to the induction of general anesthesia, antibiotic prophylaxis was administered. VTE prophylaxis was in place. General endotracheal anesthesia was then administered and tolerated well.  Please see Dr. Dene Gentry note detailing injection of ICG into ureters and bladder.   After the induction, the abdomen was prepped with Chloraprep and draped in the sterile fashion. The patient was positioned in lithotomy position. 3.5 cm incision was created Right lower quadrant. The abdominal cavity was entered under  direct visualization and the Mini GelPort device was placed and pneumoperitoneum was obtained, no hemodynamic changes were apparent. . Three 8 mm robotic ports were placed under direct visualization and an additional 5 mm assist port was placed. Patient was positioned in steep trendelenburg and left side up. Robot was brought to the field and docked in the standard fashion. WE maintained visualization of our instruments at all times and avoided any collision between arms. I scrubbed out and went to the console. There were dense adhesions from sigmoid to the abdominal wall that where lysed in the standard fashion with the scissors. The sigmoid colon was severely inflamed and fused to the dome of the bladder, meticulous dissection was performed to divide those two structures, there was a small abscess cavity between the blader and sigmoid and the abscess was drained. There was about 5 -10 cc of pus drained. It was irrigated.  We also infused methylene blue , no evidence of extravasation of blu dye from the bladder was observed.  We identified the takeoff of the inferior mesenteric artery dissected the pedicle and divided using vessel sealer  in the standard fashion.   Using the sealer we were able to divide the mesorectum and and also divided the mesentery of the descending colon we mobilized the descending colon IN a medial to lateral fashion. We preserved the ureter at all times. Pelvic dissection was performed in a standard fashion, being in the areolar space anterior to the sacrum. THe mesorectum was divided with the vessel sealer. We dissected the rectum circumferentially and We divided at the mid rectum with standard 60 mm blue load  The  white line of pot was identified and divided and  We were also able to mobilize the splenic flexure using scisors in the standard fashion. The mesentery of the descending colon was divided and we divided the mid descending colon with a blue load 60mm stapler. We  used ICG green to make sure the distal descending colon had good perfusion. We made sure we had adequate reach so the anastomosis could be performed tension free. The specimen was removed in a bag. The robot was undocked and  I scrubbed back in. Under direct visualization the descending colon stump was opened and measured the diameter of the bowel A 29 mm dilator was perfect size. A pursestring was used after inserting the anvil device. Mr. Olean Ree  was able to pass a 29 mm standard EEA stapler device through the anus and Under direct visualization we performed an end to end anastomosis with the EEA device. A leak test was performed inflating the colon with a [place] syringe and a rubber catheter. No evidence of leak was observed. There was also adequate hemostasis. Two intact doughnuts were observed.   All the laparoscopic ports were removed and a second look showed no evidence of any bleeding or any other injuries.  Liposomal marcaine was infiltrated at all incision sites in a full thickness fashion.  We changed gloves and  close the  abdomen with two -0 PDS sutures in a running fashion to close the anterior and posterior fascia respectively. The skin incisions were closed with 4-0 Monocryl. Dermabond was used to coat all the skin incisions. Needle and laparotomy count were correct and there were no immediate complications.  Caroleen Hamman, MD, FACS

## 2021-01-06 NOTE — Interval H&P Note (Signed)
History and Physical Interval Note:  01/06/2021 8:52 AM  Harold Carroll  has presented today for surgery, with the diagnosis of diverticulitis.  The various methods of treatment have been discussed with the patient and family. After consideration of risks, benefits and other options for treatment, the patient has consented to  Procedure(s): XI ROBOT [place], RNFA to assist (N/A) [place] (ICG) (N/A) as a surgical intervention.  The patient's history has been reviewed, patient examined, no change in status, stable for surgery.  I have reviewed the patient's chart and labs.  Questions were answered to the patient's satisfaction.     [place]

## 2021-01-06 NOTE — Progress Notes (Signed)
55 y.o. male scheduled for robotic assisted sigmoid colectomy.  Bilateral ureteral injection indocyanine green has been requested to aid in intraoperative ureteral localization.  The procedure was discussed in detail with Mr. Harold Carroll.  All questions were answered.

## 2021-01-07 LAB — CBC
HCT: 43.2 % (ref 39.0–52.0)
Hemoglobin: 15.3 g/dL (ref 13.0–17.0)
MCH: 30.5 pg (ref 26.0–34.0)
MCHC: 35.4 g/dL (ref 30.0–36.0)
MCV: 86.1 fL (ref 80.0–100.0)
Platelets: 287 10*3/uL (ref 150–400)
RBC: 5.02 MIL/uL (ref 4.22–5.81)
RDW: 13.9 % (ref 11.5–15.5)
WBC: 17.9 10*3/uL — ABNORMAL HIGH (ref 4.0–10.5)
nRBC: 0 % (ref 0.0–0.2)

## 2021-01-07 LAB — BASIC METABOLIC PANEL
Anion gap: 8 (ref 5–15)
BUN: 14 mg/dL (ref 6–20)
CO2: 22 mmol/L (ref 22–32)
Calcium: 8.3 mg/dL — ABNORMAL LOW (ref 8.9–10.3)
Chloride: 105 mmol/L (ref 98–111)
Creatinine, Ser: 1.04 mg/dL (ref 0.61–1.24)
GFR, Estimated: >60 mL/min (ref >60)
Glucose, Bld: 118 mg/dL — ABNORMAL HIGH (ref 70–99)
Potassium: 4.4 mmol/L (ref 3.5–5.1)
Sodium: 135 mmol/L (ref 135–145)

## 2021-01-07 MED ORDER — ENOXAPARIN SODIUM 60 MG/0.6ML IJ SOSY
0.5000 mg/kg | PREFILLED_SYRINGE | INTRAMUSCULAR | Status: DC
  Administered 2021-01-08: 52.5 mg via SUBCUTANEOUS
  Filled 2021-01-07: qty 0.6

## 2021-01-07 MED ORDER — SODIUM CHLORIDE 0.9 % IV SOLN
INTRAVENOUS | Status: DC | PRN
  Administered 2021-01-07: 250 mL via INTRAVENOUS

## 2021-01-07 MED ORDER — TAMSULOSIN HCL 0.4 MG PO CAPS
0.4000 mg | ORAL_CAPSULE | Freq: Every day | ORAL | Status: DC
  Administered 2021-01-07 – 2021-01-08 (×2): 0.4 mg via ORAL
  Filled 2021-01-07 (×2): qty 1

## 2021-01-07 NOTE — Progress Notes (Signed)
PHARMACIST - PHYSICIAN COMMUNICATION  CONCERNING:  Enoxaparin (Lovenox) for DVT Prophylaxis    RECOMMENDATION: Patient was prescribed enoxaprin 40mg  q24 hours for VTE prophylaxis.   Filed Weights   01/06/21 0851  Weight: 104.3 kg (230 lb)    Body mass index is 31.19 kg/m.  Estimated Creatinine Clearance: 101.4 mL/min (by C-G formula based on SCr of 1.04 mg/dL).   Based on [place] patient is candidate for enoxaparin 0.5mg /kg TBW SQ every 24 hours based on BMI being >30.  DESCRIPTION: Pharmacy has adjusted enoxaparin dose per [facility] policy.  Patient is now receiving enoxaparin 52.5 mg every 24 hours    Lu Duffel, PharmD, BCPS Clinical Pharmacist 01/07/2021 12:05 PM

## 2021-01-07 NOTE — Progress Notes (Signed)
Patient is passing flatus and had 2 bowel movements post surgery.  Ambulating to bathroom with standby assistance.  Will continue to monitor.  Harold Carroll

## 2021-01-07 NOTE — Progress Notes (Signed)
Mobility Specialist - Progress Note   01/07/21 1549  Mobility  Activity Ambulated in room  Level of Assistance Standby assist, set-up cues, supervision of patient - no hands on  Assistive Device None  Distance Ambulated (ft) 25 ft  Mobility Response Tolerated well  Mobility performed by Mobility specialist  $Mobility charge 1 Mobility    Pt ambulating in room, no physical assist. Pt denied SOB on RA. Some pain at incision site, but no other complaints. No LOB   Kathee Delton Mobility Specialist 01/07/21, 3:51 PM

## 2021-01-07 NOTE — Progress Notes (Signed)
[place] Hospital Day(s): 1.   Post op day(s): 1 Day Post-Op.   Interval History:  Patient seen and examined No acute events or new complaints overnight.  Patient reports he is feeling pretty good this morning, expected incisional soreness No fever, chills, nausea, emesis Likely reactive leukocytosis; improved some this morning; down to 17.9K Renal function normal, sCr - 1.04; UO - 2.4 No electrolyte derangements Surgical drain with 110 ccs; serosanguinous He has ben on full liquids; tolerating well He did have multiple BMs overnight  Vital signs in last 24 hours: [min-max] current  Temp:  [96 F (35.6 C)-98.4 F (36.9 C)] 98.2 F (36.8 C) (06/08 0419) Pulse Rate:  [71-90] 71 (06/08 0419) Resp:  [11-18] 16 (06/08 0419) BP: (119-156)/(76-97) 123/79 (06/08 0419) SpO2:  [93 %-100 %] 95 % (06/08 0419) Weight:  [104.3 kg] 104.3 kg (06/07 0851)     Height: 6' (182.9 cm) Weight: 104.3 kg BMI (Calculated): 31.19   Intake/Output last 2 shifts:  06/07 0701 - 06/08 0700 In: 2833.9 [P.O.:120; I.V.:2013.9; IV Piggyback:700] Out: 2585 [Urine:2425; Drains:110; Blood:50]   Physical Exam:  Constitutional: alert, cooperative and no distress  Respiratory: breathing non-labored at rest  Cardiovascular: regular rate and sinus rhythm  Gastrointestinal: soft, mild incisional soreness, and non-distended, no rebound/guarding, surgical drain in right mid-abdomen, output serosanguinous  Integumentary: Laparoscopic incisions are CDI with dermabond, no erythema or drainage   Labs:  CBC Latest Ref Rng & Units 01/07/2021 01/06/2021 01/02/2021  WBC 4.0 - 10.5 K/uL 17.9(H) 22.6(H) 9.7  Hemoglobin 13.0 - 17.0 g/dL 15.3 17.7(H) 17.2(H)  Hematocrit 39.0 - 52.0 % 43.2 50.7 50.0  Platelets 150 - 400 K/uL 287 328 310   CMP Latest Ref Rng & Units 01/07/2021 01/06/2021 11/14/2020  Glucose 70 - 99 mg/dL 118(H) - 137(H)  BUN 6 - 20 mg/dL 14 - 14  Creatinine 0.61 - 1.24 mg/dL  1.04 1.30(H) 1.19  Sodium 135 - 145 mmol/L 135 - 137  Potassium 3.5 - 5.1 mmol/L 4.4 - 3.8  Chloride 98 - 111 mmol/L 105 - 104  CO2 22 - 32 mmol/L 22 - 23  Calcium 8.9 - 10.3 mg/dL 8.3(L) - 9.0  Total Protein 6.5 - 8.1 g/dL - - 7.2  Total Bilirubin 0.3 - 1.2 mg/dL - - 0.9  Alkaline Phos 38 - 126 U/L - - 91  AST 15 - 41 U/L - - 27  ALT 0 - 44 U/L - - 27     Imaging studies: No new pertinent imaging studies   Assessment/Plan:  55 y.o. male doing well with ROBF 1 Day Post-Op s/p robotic assisted laparoscopic LAR with EEA for colovesical fistula.   - Advance diet as tolerated today  - Wean from IVF  - Complete peri-operative Abx  - Discontinue Foley Catheter  - Discontinue Entereg    - Monitor abdominal examination; on-going bowel function  - Pain control prn; antiemetics prn   - Monitor leukocytosis; improving; likely reactive   - Encouraged mobilization    - Discharge Planning: Doing well, hopefully home in AM  All of the above findings and recommendations were discussed with the patient, and the medical team, and all of patient's questions were answered to his expressed satisfaction.  -- Edison Simon, PA-C [facility] 01/07/2021, 7:12 AM [phone] M-F: 7am - 4pm

## 2021-01-07 NOTE — Progress Notes (Signed)
   01/07/21 1400  Clinical Encounter Type  Visited With Patient  Visit Type Initial;Spiritual support;Social support  Referral From Chaplain  Consult/Referral To [facility] did an initial visit with Harold Carroll. Chaplain offered emotional and spiritual support. Harold Carroll stated he was grateful for the visit. He thanked the [employer] for the visit and is looking forward to being discharged tomorrow.

## 2021-01-07 NOTE — Op Note (Signed)
Preoperative diagnosis:  1. Recurrent diverticulitis with colovesical fistula  Postoperative diagnosis:  1. Same  Procedure: 1. Cystoscopy with bilateral ureteral catheterization and injection indocyanine green   Surgeon: Abbie Sons, MD  Anesthesia: General  Complications: None  Intraoperative findings:  Cystoscopy-the urethra was normal in caliber without stricture.  Prostatic urethra with prominent trilobar enlargement.  Moderate bladder trabeculation with 2 posterior wall diverticula near the bladder neck which showed no lesions.  Mild diffuse mucosal erythema globally with particulate matter noted.  EBL: Minimal  Specimens: None  Indication: Harold Carroll is a 55 y.o. male with recurrent diverticulitis and colovesical fistula scheduled for robotic assisted sigmoid resection.  Injection of intraureteral ICG has been requested to aid in intraoperative ureteral localization.  After reviewing the management options for treatment, he elected to proceed with the above surgical procedure(s). We have discussed the potential benefits and risks of the procedure, side effects of the proposed treatment, the likelihood of the patient achieving the goals of the procedure, and any potential problems that might occur during the procedure or recuperation. Informed consent has been obtained.  Description of procedure:  The patient was taken to the operating room and general anesthesia was induced.  The patient was placed in the dorsal lithotomy position, prepped and draped in the usual sterile fashion, and preoperative antibiotics were administered. A preoperative time-out was performed.   A 21 French cystoscope was lubricated, passed per urethra and advanced proximally under direct vision with findings as described above  The right ureteral orifice was then defied and a 5 [place] open-ended catheter was unable to be advanced into the ureter.  A 0.038 Sensor wire was then to be advanced  into the ureter and the catheter was advanced over the wire to the 15 cm mark.  10 cc of ICG was injected to the ureteral catheter with a pullout technique.  The left ureter orifice was notified and able to be catheterized without the aid of a guidewire and 10 cc of ICG was injected in similar fashion.  Per Dr. Corlis Leak request an additional 10 cc of ICG was instilled in the bladder.  The cystoscope was removed and a 16 French Foley catheter was placed and was clamped.  He was then prepped and draped for his laparoscopic procedure which has been dictated separately by  Dr. Dahlia Byes.   Abbie Sons, M.D.

## 2021-01-08 LAB — SURGICAL PATHOLOGY

## 2021-01-08 MED ORDER — OXYCODONE HCL 5 MG PO TABS: 5.0000 mg | ORAL_TABLET | ORAL | 0 refills | Status: DC | PRN

## 2021-01-08 MED ORDER — IBUPROFEN 600 MG PO TABS
600.0000 mg | ORAL_TABLET | Freq: Four times a day (QID) | ORAL | 0 refills | Status: DC | PRN
Start: 2021-01-08 — End: 2021-01-26

## 2021-01-08 NOTE — Progress Notes (Signed)
Patient ambulated about 3200 feet in hallway and had another bowel movement this morning. No complaints of pain. He is still receiving scheduled tylenol and toradol.  Will continue to monitor.  Christene Slates

## 2021-01-08 NOTE — Discharge Summary (Signed)
[facility] SURGICAL DISCHARGE SUMMARY  Patient ID: Harold Carroll MRN: [id number] DOB/AGE: [birth year] 55 y.o.  Admit date: 01/06/2021 Discharge date: 01/08/2021  Discharge Diagnoses Patient Active Problem List   Diagnosis Date Noted   S/P laparoscopic colectomy 01/06/2021    Consultants None  Procedures 01/06/2021:  1. Robotic assisted Laparoscopic Low anterior resection 2. Robotic Laparoscopic takedown of splenic flexure 3. perfusion check of graft anastomosis using ICG  HPI: Harold Carroll is a 55 y.o. male with a prior history of recurrent UTIs and CT findings consistent with colovesical fistula  Hospital Course: Informed consent was obtained and documented, and patient underwent uneventful robotic assisted sigmoid colectomy (Dr Dahlia Byes, 01/06/2021).  Post-operatively, patient did very well and had return of bowel function on POD1 Advancement of patient's diet and ambulation were well-tolerated. The remainder of patient's hospital course was essentially unremarkable, and discharge planning was initiated accordingly with patient safely able to be discharged home with appropriate discharge instructions, pain control, and outpatient follow-up after all hos questions were answered to his expressed satisfaction.   Discharge Condition: Good   Physical Examination:  Constitutional: alert, cooperative and no distress Respiratory: breathing non-labored at rest Cardiovascular: regular rate and sinus rhythm Gastrointestinal: soft, mild incisional soreness, and non-distended, no rebound/guarding, surgical drain in right mid-abdomen, output serosanguinous (will remove) Integumentary: Laparoscopic incisions are CDI with dermabond, no erythema or drainage      Allergies as of 01/08/2021   No Known Allergies      Medication List     STOP taking these medications    bisacodyl 5 MG EC tablet Commonly known as: DULCOLAX   ciprofloxacin 500 MG  tablet Commonly known as: CIPRO   metroNIDAZOLE 500 MG tablet Commonly known as: Flagyl   neomycin 500 MG tablet Commonly known as: MYCIFRADIN   polyethylene glycol powder 17 GM/SCOOP powder Commonly known as: MiraLax       TAKE these medications    aspirin 81 MG EC tablet Take 81 mg by mouth daily.   cetirizine 10 MG tablet Commonly known as: ZYRTEC Take 10 mg by mouth daily.   fluticasone 50 MCG/ACT nasal spray Commonly known as: FLONASE Place 2 sprays into both nostrils daily. What changed:  when to take this reasons to take this   ibuprofen 600 MG tablet Commonly known as: ADVIL Take 1 tablet (600 mg total) by mouth every 6 (six) hours as needed.   LORazepam 0.5 MG tablet Commonly known as: ATIVAN Take 1 tablet (0.5 mg total) by mouth daily as needed for anxiety.   multivitamin capsule Take 1 capsule by mouth daily.   omeprazole 10 MG capsule Commonly known as: PRILOSEC Take 10 mg by mouth daily.   oxyCODONE 5 MG immediate release tablet Commonly known as: Oxy IR/ROXICODONE Take 1 tablet (5 mg total) by mouth every 4 (four) hours as needed for severe pain or breakthrough pain.   sildenafil 20 MG tablet Commonly known as: Revatio Take 1 tablet (20 mg total) by mouth as needed. Take 1-5 tabs as needed prior to intercourse   simvastatin 10 MG tablet Commonly known as: ZOCOR Take 1 tablet (10 mg total) by mouth at bedtime.   tamsulosin 0.4 MG Caps capsule Commonly known as: FLOMAX Take 1 capsule (0.4 mg total) by mouth daily.          Follow-up Information     Pabon, [place] F, MD. Schedule an appointment as soon as possible for a visit in 2 week(s).   Specialty: General  Surgery Why: S/P LAPAROSCOPIC SIGMOID COLECTOMY Contact information: [address] [place] [address] [phone]                  Time spent on discharge management including discussion of hospital course, clinical condition, outpatient  instructions, prescriptions, and follow up with the patient and members of the medical team: >30 minutes  -- Edison Simon , PA-C [facility]  01/08/2021, 8:33 AM [phone] M-F: 7am - 4pm

## 2021-01-08 NOTE — Discharge Instructions (Signed)
In addition to included general post-operative instructions,  Diet: Resume home diet.   Activity: No heavy lifting >20 pounds (children, pets, laundry, garbage) for 4 weeks, but light activity and walking are encouraged. Do not drive or drink alcohol if taking narcotic pain medications or having pain that might distract from driving.  Wound care: You may shower/get incision wet with soapy water and pat dry (do not rub incisions), but no baths or submerging incision underwater until follow-up.   Medications: Resume all home medications. For mild to moderate pain: acetaminophen (Tylenol) or ibuprofen/naproxen (if no kidney disease). Combining Tylenol with alcohol can substantially increase your risk of causing liver disease. Narcotic pain medications, if prescribed, can be used for severe pain, though may cause nausea, constipation, and drowsiness. Do not combine Tylenol and Percocet (or similar) within a 6 hour period as Percocet (and similar) contain(s) Tylenol. If you do not need the narcotic pain medication, you do not need to fill the prescription.  Call office [phone] / [phone]) at any time if any questions, worsening pain, fevers/chills, bleeding, drainage from incision site, or other concerns.

## 2021-01-19 ENCOUNTER — Ambulatory Visit: Payer: BC Managed Care – PPO | Admitting: Urology

## 2021-01-26 ENCOUNTER — Other Ambulatory Visit: Payer: Self-pay

## 2021-01-26 ENCOUNTER — Ambulatory Visit (INDEPENDENT_AMBULATORY_CARE_PROVIDER_SITE_OTHER): Payer: BC Managed Care – PPO | Admitting: Surgery

## 2021-01-26 ENCOUNTER — Encounter: Payer: Self-pay | Admitting: Surgery

## 2021-01-26 VITALS — BP 144/88 | HR 85 | Temp 98.0°F | Ht 72.0 in | Wt 223.6 lb

## 2021-01-26 DIAGNOSIS — Z09 Encounter for follow-up examination after completed treatment for conditions other than malignant neoplasm: Secondary | ICD-10-CM

## 2021-01-26 NOTE — Patient Instructions (Addendum)
You may return to work next week if you are comfortable.   GENERAL POST-OPERATIVE PATIENT INSTRUCTIONS   WOUND CARE INSTRUCTIONS: Try to keep the wound dry and avoid ointments on the wound unless directed to do so.  If the wound becomes bright red and painful or starts to drain infected material that is not clear, please contact your physician immediately.  If the wound is mildly pink and has a thick firm ridge underneath it, this is normal, and is referred to as a healing ridge.  This will resolve over the next 4-6 weeks.  BATHING: You may shower if you have been informed of this by your surgeon. However, Please do not submerge in a tub, hot tub, or pool until incisions are completely sealed or have been told by your surgeon that you may do so.  DIET:  You may eat any foods that you can tolerate.  It is a good idea to eat a high fiber diet and take in plenty of fluids to prevent constipation.  If you do become constipated you may want to take a mild laxative or take ducolax tablets on a daily basis until your bowel habits are regular.  Constipation can be very uncomfortable, along with straining, after recent surgery.  ACTIVITY: You are encouraged to walk and engage in light activity for the next two weeks.  You should not lift, push, or pull more than 20 pounds for 6 weeks after surgery as it could put you at increased risk for complications.  Twenty pounds is roughly equivalent to a plastic bag of groceries. At that time- Listen to your body when lifting, if you have pain when lifting, stop and then try again in a few days. Soreness after doing exercises or activities of daily living is normal as you get back in to your normal routine.  MEDICATIONS:  Try to take narcotic medications and anti-inflammatory medications, such as tylenol, ibuprofen, naprosyn, etc., with food.  This will minimize stomach upset from the medication.  Should you develop nausea and vomiting from the pain medication, or  develop a rash, please discontinue the medication and contact your physician.  You should not drive, make important decisions, or operate machinery when taking narcotic pain medication.  SUNBLOCK Use sun block to incision area over the next year if this area will be exposed to sun. This helps decrease scarring and will allow you avoid a permanent darkened area over your incision.  QUESTIONS:  Please feel free to call our office if you have any questions, and we will be glad to assist you.

## 2021-01-26 NOTE — Progress Notes (Signed)
Two and Half weeks after robotic low ant resection for colovesical fistula.    He is very well-appearing is ambulating tolerating diet and having normal bowel movements.  Urinary symptoms completely resolved  Physical exam: No acute distress Abdomen's: Soft nontender incisions healing well without evidence of infection no peritonitis  A/ P Doing very well without complications.  Pathology discussed with patient in detail RTC 3 months

## 2021-01-28 ENCOUNTER — Telehealth: Payer: Self-pay | Admitting: *Deleted

## 2021-01-28 NOTE — Telephone Encounter (Signed)
Faxed FMLA to [facility] [employer] at [phone]

## 2021-02-06 ENCOUNTER — Encounter: Payer: Self-pay | Admitting: Urology

## 2021-02-09 MED ORDER — SILDENAFIL CITRATE 20 MG PO TABS: 20.0000 mg | ORAL_TABLET | ORAL | 11 refills | Status: DC | PRN

## 2021-02-11 ENCOUNTER — Encounter: Payer: Self-pay | Admitting: Urology

## 2021-02-11 ENCOUNTER — Ambulatory Visit (INDEPENDENT_AMBULATORY_CARE_PROVIDER_SITE_OTHER): Payer: BC Managed Care – PPO | Admitting: Urology

## 2021-02-11 ENCOUNTER — Other Ambulatory Visit: Payer: Self-pay

## 2021-02-11 VITALS — BP 144/78 | HR 86 | Ht 72.0 in | Wt 223.0 lb

## 2021-02-11 DIAGNOSIS — R310 Gross hematuria: Secondary | ICD-10-CM

## 2021-02-11 LAB — URINALYSIS, COMPLETE
Bilirubin, UA: NEGATIVE
Glucose, UA: NEGATIVE
Ketones, UA: NEGATIVE
Nitrite, UA: NEGATIVE
Protein,UA: NEGATIVE
RBC, UA: NEGATIVE
Specific Gravity, UA: 1.025 (ref 1.005–1.030)
Urobilinogen, Ur: 1 mg/dL (ref 0.2–1.0)
pH, UA: 6 (ref 5.0–7.5)

## 2021-02-11 LAB — MICROSCOPIC EXAMINATION
Bacteria, UA: NONE SEEN
Epithelial Cells (non renal): NONE SEEN /hpf (ref 0–10)
RBC, Urine: NONE SEEN /hpf (ref 0–2)

## 2021-02-11 MED ORDER — FINASTERIDE 5 MG PO TABS: 5.0000 mg | ORAL_TABLET | Freq: Every day | ORAL | 3 refills | Status: DC

## 2021-02-11 NOTE — Progress Notes (Signed)
   02/11/21  CC:  Chief Complaint  Patient presents with   Cysto    HPI: 55 year old male with recurrent UTI/hematuria found to have colovesical fistula now status postsurgical repair.  Since the surgery, has been doing extremely well and not had any more bladder infections.  He does continue to have obstructive urinary symptoms in the form of weak stream and difficulty emptying his bladder.  He is chronically on Flomax.  Blood pressure (!) 149/93, pulse 79. NED. A&Ox3.   No respiratory distress   Abd soft, NT, ND Normal phallus with bilateral descended testicles  Cystoscopy Procedure Note  Patient identification was confirmed, informed consent was obtained, and patient was prepped using Betadine solution.  Lidocaine jelly was administered per urethral meatus.     Pre-Procedure: - Inspection reveals a normal caliber ureteral meatus.  Procedure: The flexible cystoscope was introduced without difficulty - No urethral strictures/lesions are present. - Enlarged prostate trilobar coaptation - Elevated bladder neck - Bilateral ureteral orifices identified - Bladder mucosa  reveals no papillary tumors or lesions.  All inflammation has resolved. - No bladder stones -Moderate trabeculation with 2 distinct diverticula on the posterior bladder wall closer to the bladder neck   Retroflexion shows intravesical portion of the prostate extending into the bladder with friable mucosa/hypervascularity   Post-Procedure: - Patient tolerated the procedure well  Assessment/ Plan:  1. Colovesical fistula Resolved, inflammation is now gone   2. Benign prostatic hyperplasia with incomplete bladder emptying Continue Flomax  Given prostamegaly, moderate trabeculation with diverticula as well as incomplete bladder emptying, patient would likely benefit from an outlet procedure as previously mentioned.  At this time, he still recovering from surgery does not want to pursue anything.  He  wonders if adding another medication may help.  Will plan to add finasteride and reassess urinary symptoms in 6 months.  At that point in time, he would likely be a good candidate for HoLEP and or TURP given the presence of a intravesical lobe if his symptoms fail to improve with maximal medical therapy.   F/u 6 months with PSA/ IPSS/ PVR  Hollice Espy, MD

## 2021-03-08 ENCOUNTER — Telehealth: Payer: BC Managed Care – PPO | Admitting: Physician Assistant

## 2021-03-08 DIAGNOSIS — U071 COVID-19: Secondary | ICD-10-CM

## 2021-03-08 MED ORDER — ALBUTEROL SULFATE HFA 108 (90 BASE) MCG/ACT IN AERS: 2.0000 | INHALATION_SPRAY | Freq: Four times a day (QID) | RESPIRATORY_TRACT | 0 refills | Status: DC | PRN

## 2021-03-08 MED ORDER — MOLNUPIRAVIR EUA 200MG CAPSULE: 4.0000 | ORAL_CAPSULE | Freq: Two times a day (BID) | ORAL | 0 refills | Status: AC

## 2021-03-08 NOTE — Progress Notes (Signed)
Virtual Visit Consent   Harold Carroll, you are scheduled for a virtual visit with a [facility] provider today.     Just as with appointments in the office, your consent must be obtained to participate.  Your consent will be active for this visit and any virtual visit you may have with one of our providers in the next 365 days.     If you have a MyChart account, a copy of this consent can be sent to you electronically.  All virtual visits are billed to your insurance company just like a traditional visit in the office.    As this is a virtual visit, video technology does not allow for your provider to perform a traditional examination.  This may limit your provider's ability to fully assess your condition.  If your provider identifies any concerns that need to be evaluated in person or the need to arrange testing (such as labs, EKG, etc.), we will make arrangements to do so.     Although advances in technology are sophisticated, we cannot ensure that it will always work on either your end or our end.  If the connection with a video visit is poor, the visit may have to be switched to a telephone visit.  With either a video or telephone visit, we are not always able to ensure that we have a secure connection.     I need to obtain your verbal consent now.   Are you willing to proceed with your visit today?    [name] has provided verbal consent on 03/08/2021 for a virtual visit (video or telephone).   Harold Carroll, [place]   Date: 03/08/2021 8:27 AM   Virtual Visit via Video Note   I, Harold Carroll, connected with  Harold Carroll  ([id number], [id number]) on 03/08/21 at  8:30 AM EDT by a video-enabled telemedicine application and verified that I am speaking with the correct person using two identifiers.  Location: Patient: Virtual Visit Location Patient: Home Provider: Virtual Visit Location Provider: Home Office   I discussed the limitations of evaluation  and management by telemedicine and the availability of in person appointments. The patient expressed understanding and agreed to proceed.    History of Present Illness: Harold Carroll is a 55 y.o. who identifies as a male who was assigned male at birth, and is being seen today for COVID-19. Came back from the beach last week -- girlfriend testing positive this past Tuesday after symptom onset. He was asymptomatic earlier in the week and giving exposure was initially tested and was negative (Wednesday). Starting this morning noting some chest tightness, nasal congestion, chest congestion. Denies significant headache or fever. Denies chills or body aches thus far. Noting scratchy throat now as well. Took another COVID test this morning which was positive.   Smoker. Has been told by his PCP he has mild COPD but does ot require medications for those. No personal history of CAD, DM. Does have history of polycythemia.   HPI: HPI  Problems:  Patient Active Problem List   Diagnosis Date Noted   S/P laparoscopic colectomy 01/06/2021   Special screening for malignant neoplasms, colon    Polyp of sigmoid colon    Polycythemia 02/19/2019   Mixed hyperlipidemia 10/02/2015   Overweight (BMI 25.0-29.9) 09/24/2015   Tobacco use disorder 09/24/2015   Panic disorder 09/24/2015   FH: heart disease 09/24/2015   FH: diabetes mellitus 09/24/2015   GERD (gastroesophageal reflux disease) 09/24/2015  Seasonal allergic rhinitis 09/24/2015    Allergies: No Known Allergies Medications:  Current Outpatient Medications:    albuterol (VENTOLIN HFA) 108 (90 Base) MCG/ACT inhaler, Inhale 2 puffs into the lungs every 6 (six) hours as needed for wheezing or shortness of breath., Disp: 8 g, Rfl: 0   molnupiravir EUA 200 mg CAPS, Take 4 capsules (800 mg total) by mouth 2 (two) times daily for 5 days., Disp: 40 capsule, Rfl: 0   aspirin 81 MG EC tablet, Take 81 mg by mouth daily., Disp: , Rfl:    cetirizine (ZYRTEC)  10 MG tablet, Take 10 mg by mouth daily., Disp: , Rfl:    finasteride (PROSCAR) 5 MG tablet, Take 1 tablet (5 mg total) by mouth daily., Disp: 90 tablet, Rfl: 3   fluticasone (FLONASE) 50 MCG/ACT nasal spray, Place 2 sprays into both nostrils daily., Disp: 16 g, Rfl: 6   LORazepam (ATIVAN) 0.5 MG tablet, Take 1 tablet (0.5 mg total) by mouth daily as needed for anxiety., Disp: 10 tablet, Rfl: 0   Multiple Vitamin (MULTIVITAMIN) capsule, Take 1 capsule by mouth daily., Disp: , Rfl:    omeprazole (PRILOSEC) 10 MG capsule, Take 10 mg by mouth daily., Disp: , Rfl:    sildenafil (REVATIO) 20 MG tablet, Take 1 tablet (20 mg total) by mouth as needed. Take 1-5 tabs as needed prior to intercourse, Disp: 30 tablet, Rfl: 11   simvastatin (ZOCOR) 10 MG tablet, Take 1 tablet (10 mg total) by mouth at bedtime., Disp: 90 tablet, Rfl: 3   tamsulosin (FLOMAX) 0.4 MG CAPS capsule, Take 1 capsule (0.4 mg total) by mouth daily., Disp: 90 capsule, Rfl: 1  Observations/Objective: Patient is well-developed, well-nourished in no acute distress.  Resting comfortably at home.  Head is normocephalic, atraumatic.  No labored breathing. Speech is clear and coherent with logical content.  Patient is alert and oriented at baseline.   Assessment and Plan: 1. COVID-19 - albuterol (VENTOLIN HFA) 108 (90 Base) MCG/ACT inhaler; Inhale 2 puffs into the lungs every 6 (six) hours as needed for wheezing or shortness of breath.  Dispense: 8 g; Refill: 0 - molnupiravir EUA 200 mg CAPS; Take 4 capsules (800 mg total) by mouth 2 (two) times daily for 5 days.  Dispense: 40 capsule; Refill: 0 Patient with multiple risk factors for complicated course of illness. Discussed risks/benefits of antiviral medications including most common potential ADRs. Patient voiced understanding and would like to proceed with antiviral medication. They are candidate for molnupiravir. Rx sent to pharmacy. Supportive measures, OTC medications and vitamin  regimen reviewed. Albuterol per orders. Patient has been enrolled in a MyChart COVID symptom monitoring program. Samule Dry reviewed in detail. Strict ER precautions discussed with patient.    Follow Up Instructions: I discussed the assessment and treatment plan with the patient. The patient was provided an opportunity to ask questions and all were answered. The patient agreed with the plan and demonstrated an understanding of the instructions.  A copy of instructions were sent to the patient via MyChart.  The patient was advised to call back or seek an in-person evaluation if the symptoms worsen or if the condition fails to improve as anticipated.  Time:  I spent 15 minutes with the patient via telehealth technology discussing the above problems/concerns.    Harold Rio, PA-C

## 2021-03-08 NOTE — Patient Instructions (Signed)
Harold Carroll, thank you for joining [name], PA-C for today's virtual visit.  While this provider is not your primary care provider (PCP), if your PCP is located in our provider database this encounter information will be shared with them immediately following your visit.  Consent: (Patient) Harold Carroll provided verbal consent for this virtual visit at the beginning of the encounter.  Current Medications:  Current Outpatient Medications:    albuterol (VENTOLIN HFA) 108 (90 Base) MCG/ACT inhaler, Inhale 2 puffs into the lungs every 6 (six) hours as needed for wheezing or shortness of breath., Disp: 8 g, Rfl: 0   molnupiravir EUA 200 mg CAPS, Take 4 capsules (800 mg total) by mouth 2 (two) times daily for 5 days., Disp: 40 capsule, Rfl: 0   aspirin 81 MG EC tablet, Take 81 mg by mouth daily., Disp: , Rfl:    cetirizine (ZYRTEC) 10 MG tablet, Take 10 mg by mouth daily., Disp: , Rfl:    finasteride (PROSCAR) 5 MG tablet, Take 1 tablet (5 mg total) by mouth daily., Disp: 90 tablet, Rfl: 3   fluticasone (FLONASE) 50 MCG/ACT nasal spray, Place 2 sprays into both nostrils daily., Disp: 16 g, Rfl: 6   LORazepam (ATIVAN) 0.5 MG tablet, Take 1 tablet (0.5 mg total) by mouth daily as needed for anxiety., Disp: 10 tablet, Rfl: 0   Multiple Vitamin (MULTIVITAMIN) capsule, Take 1 capsule by mouth daily., Disp: , Rfl:    omeprazole (PRILOSEC) 10 MG capsule, Take 10 mg by mouth daily., Disp: , Rfl:    sildenafil (REVATIO) 20 MG tablet, Take 1 tablet (20 mg total) by mouth as needed. Take 1-5 tabs as needed prior to intercourse, Disp: 30 tablet, Rfl: 11   simvastatin (ZOCOR) 10 MG tablet, Take 1 tablet (10 mg total) by mouth at bedtime., Disp: 90 tablet, Rfl: 3   tamsulosin (FLOMAX) 0.4 MG CAPS capsule, Take 1 capsule (0.4 mg total) by mouth daily., Disp: 90 capsule, Rfl: 1   Medications ordered in this encounter:  Meds ordered this encounter  Medications   albuterol (VENTOLIN  HFA) 108 (90 Base) MCG/ACT inhaler    Sig: Inhale 2 puffs into the lungs every 6 (six) hours as needed for wheezing or shortness of breath.    Dispense:  8 g    Refill:  0    Order Specific Question:   Supervising Provider    Answer:   MILLER, BRIAN [[id number]]   molnupiravir EUA 200 mg CAPS    Sig: Take 4 capsules (800 mg total) by mouth 2 (two) times daily for 5 days.    Dispense:  40 capsule    Refill:  0    Order Specific Question:   Supervising Provider    Answer:   Sabra Heck, [place]     *If you need refills on other medications prior to your next appointment, please contact your pharmacy*  Follow-Up: Call back or seek an in-person evaluation if the symptoms worsen or if the condition fails to improve as anticipated.  Other Instructions Please keep well-hydrated and get plenty of rest. Start a saline nasal rinse to flush out your nasal passages. You can use plain Mucinex to help thin congestion. If you have a humidifier, running in the bedroom at night. I want you to start OTC vitamin D3 1000 units daily, vitamin C 1000 mg daily, and a zinc supplement. Please take prescribed medications as directed.  You have been enrolled in a MyChart symptom monitoring program. Please  answer these questions daily so we can keep track of how you are doing.  You were to quarantine for 5 days from onset of your symptoms.  After day 5, if you have had no fever and you are feeling better, you can end quarantine but need to mask for an additional 5 days. After day 5 if you have a fever or are having significant symptoms, please quarantine for full 10 days.  If you note any worsening of symptoms, any significant shortness of breath or any chest pain, please seek ER evaluation ASAP.  Please do not delay care!    If you have been instructed to have an in-person evaluation today at a local Urgent Care facility, please use the link below. It will take you to a list of all of our available [facility], including address, phone number and hours of operation. Please do not delay care.  [facility]  If you or a family member do not have a primary care provider, use the link below to schedule a visit and establish care. When you choose a [facility] primary care physician or advanced practice provider, you gain a long-term partner in health. Find a Primary Care Provider  Learn more about [facility]'s in-office and virtual care options: [facility]

## 2021-03-11 ENCOUNTER — Encounter: Payer: Self-pay | Admitting: Urology

## 2021-03-11 ENCOUNTER — Other Ambulatory Visit: Payer: Self-pay

## 2021-03-11 MED ORDER — TAMSULOSIN HCL 0.4 MG PO CAPS: 0.4000 mg | ORAL_CAPSULE | Freq: Every day | ORAL | 3 refills | Status: DC

## 2021-03-11 NOTE — Telephone Encounter (Signed)
Pt requested refill on flomax. Pt aware medication sent to the pharmacy

## 2021-04-27 ENCOUNTER — Ambulatory Visit: Payer: BC Managed Care – PPO | Admitting: Surgery

## 2021-04-27 ENCOUNTER — Encounter: Payer: Self-pay | Admitting: Surgery

## 2021-04-27 ENCOUNTER — Other Ambulatory Visit: Payer: Self-pay

## 2021-04-27 VITALS — BP 126/87 | HR 101 | Temp 98.0°F | Ht 72.0 in | Wt 227.6 lb

## 2021-04-27 DIAGNOSIS — N321 Vesicointestinal fistula: Secondary | ICD-10-CM | POA: Diagnosis not present

## 2021-04-27 NOTE — Patient Instructions (Addendum)
Please call our office if you have any questions or concerns.  

## 2021-05-04 NOTE — Progress Notes (Signed)
Outpatient Surgical Follow Up  05/04/2021  Harold Carroll is an 55 y.o. male.   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  He has done very well.  He is having normal bowel movements he is having no abdominal pain he is having no evidence of UTIs.  He is very happy with his results.  Past Medical History:  Diagnosis Date   GERD (gastroesophageal reflux disease)    Panic attacks    Pneumonia    2018   Polycythemia     Past Surgical History:  Procedure Laterality Date   COLONOSCOPY WITH PROPOFOL N/A 06/18/2019   Procedure: COLONOSCOPY WITH BIOPSY;  Surgeon: Lucilla Lame, MD;  Location: [facility];  Service: Endoscopy;  Laterality: N/A;   HERNIA REPAIR     INGUINAL HERNIA REPAIR Left 10/28/2015   Procedure: HERNIA REPAIR INGUINAL ADULT;  Surgeon: Hubbard Robinson, MD;  Location: [facility];  Service: General;  Laterality: Left;   POLYPECTOMY  06/18/2019   Procedure: POLYPECTOMY;  Surgeon: Lucilla Lame, MD;  Location: [facility];  Service: Endoscopy;;   SKIN SURGERY     Tumor-benign   TONSILLECTOMY AND ADENOIDECTOMY     TUMOR REMOVAL     VENTRAL HERNIA REPAIR  1968   Dr. Mare Ferrari    Family History  Problem Relation Age of Onset   Thyroid disease Mother    Heart disease Father        heart attack   Diabetes Father    Lung cancer Maternal Aunt    Cancer Maternal Aunt        lung cancer   Heart disease Paternal Uncle    Cancer Maternal Grandfather        lung cancer   Prostate cancer Brother 71   Lupus Brother    Prostate cancer Maternal Uncle     Social History:  reports that he has been smoking cigarettes. He started smoking about 37 years ago. He has a 45.00 pack-year smoking history. He has never used smokeless tobacco. He reports current alcohol use of about 8.0 standard drinks per  week. He reports current drug use. Drug: Marijuana.  Allergies: No Known Allergies  Medications reviewed.    ROS Full ROS performed and is otherwise negative other than what is stated in HPI   BP 126/87   Pulse (!) 101   Temp 98 F (36.7 C) (Oral)   Ht 6' (1.829 m)   Wt 227 lb 9.6 oz (103.2 kg)   SpO2 95%   BMI 30.87 kg/m   Physical Exam Vitals and nursing note reviewed. Exam conducted with a chaperone present.  Constitutional:      General: He is not in acute distress.    Appearance: Normal appearance. He is normal weight. He is not ill-appearing or toxic-appearing.  Eyes:     General:        Right eye: No discharge.        Left eye: No discharge.  Cardiovascular:     Rate and Rhythm: Normal rate and regular rhythm.     Heart sounds: No murmur heard. Pulmonary:     Effort: Pulmonary effort is normal. No respiratory distress.     Breath sounds: Normal breath sounds. No stridor. No wheezing.  Abdominal:     General: Abdomen is flat. There is  no distension.     Palpations: Abdomen is soft. There is no mass.     Tenderness: There is no abdominal tenderness. There is no guarding or rebound.     Hernia: No hernia is present.  Musculoskeletal:     Cervical back: Normal range of motion and neck supple. No rigidity or tenderness.  Lymphadenopathy:     Cervical: No cervical adenopathy.  Skin:    General: Skin is warm and dry.     Capillary Refill: Capillary refill takes less than 2 seconds.  Neurological:     General: No focal deficit present.     Mental Status: He is alert and oriented to person, place, and time.  Psychiatric:        Mood and Affect: Mood normal.        Behavior: Behavior normal.        Thought Content: Thought content normal.        Judgment: Judgment normal.     Assessment/Plan: Rectal status post robotic sigmoid colectomy for colovesical fistula.  He is doing very well without complications.  No need for any further surgical  follow-up.  Greater than 50% of the 20 minutes  visit was spent in counseling/coordination of care   Caroleen Hamman, MD [facility]

## 2021-05-15 ENCOUNTER — Inpatient Hospital Stay: Payer: BC Managed Care – PPO | Attending: Oncology

## 2021-05-15 ENCOUNTER — Inpatient Hospital Stay: Payer: BC Managed Care – PPO | Admitting: Nurse Practitioner

## 2021-05-15 ENCOUNTER — Other Ambulatory Visit: Payer: Self-pay

## 2021-05-15 ENCOUNTER — Inpatient Hospital Stay: Payer: BC Managed Care – PPO

## 2021-05-15 VITALS — BP 140/100 | HR 93 | Temp 98.2°F | Resp 16 | Wt 226.7 lb

## 2021-05-15 DIAGNOSIS — D751 Secondary polycythemia: Secondary | ICD-10-CM | POA: Insufficient documentation

## 2021-05-15 LAB — CBC WITH DIFFERENTIAL/PLATELET
Abs Immature Granulocytes: 0.05 10*3/uL (ref 0.00–0.07)
Basophils Absolute: 0.1 10*3/uL (ref 0.0–0.1)
Basophils Relative: 1 %
Eosinophils Absolute: 0.3 10*3/uL (ref 0.0–0.5)
Eosinophils Relative: 3 %
HCT: 52.3 % — ABNORMAL HIGH (ref 39.0–52.0)
Hemoglobin: 17.6 g/dL — ABNORMAL HIGH (ref 13.0–17.0)
Immature Granulocytes: 0 %
Lymphocytes Relative: 22 %
Lymphs Abs: 2.7 10*3/uL (ref 0.7–4.0)
MCH: 28.8 pg (ref 26.0–34.0)
MCHC: 33.7 g/dL (ref 30.0–36.0)
MCV: 85.5 fL (ref 80.0–100.0)
Monocytes Absolute: 0.9 10*3/uL (ref 0.1–1.0)
Monocytes Relative: 7 %
Neutro Abs: 8.2 10*3/uL — ABNORMAL HIGH (ref 1.7–7.7)
Neutrophils Relative %: 67 %
Platelets: 293 10*3/uL (ref 150–400)
RBC: 6.12 MIL/uL — ABNORMAL HIGH (ref 4.22–5.81)
RDW: 16.8 % — ABNORMAL HIGH (ref 11.5–15.5)
WBC: 12.3 10*3/uL — ABNORMAL HIGH (ref 4.0–10.5)
nRBC: 0 % (ref 0.0–0.2)

## 2021-05-15 NOTE — Progress Notes (Signed)
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  Of note patient has had an elevated hemoglobin dating back to 2016 and his yearly hemoglobin trends have been 17.8, 18.8, 19.3 and then most recently 20.1 on 01/24/2019.  Hemoglobin and platelet counts have been normal.  He does have a chronic smoking history and currently smokes 1 pack/day and has been doing so for the last 30 years.  He denies any exogenous testosterone use.  He does report getting a good night sleep and wakes up in the morning feeling refreshed but does note that his partner has noticed that he snores at night.  He does not have any known chronic lung disease.    EPO level was normal. JAK2 mutation testing was negative. UA showed no hematuria  Interval history-patient is 55 year old male who returns to clinic for labs, follow-up, and possible phlebotomy.  He feels well and denies specific complaints.  He is status post robotic low anterior resection for colovesicular fistula approximately 4 months ago.  Feels great, has not had further UTIs.  ECOG PS- 0 Pain scale- 0   Review of systems- Review of Systems  Constitutional:  Negative for chills, fever, malaise/fatigue and weight loss.  HENT:  Negative for congestion, ear discharge and nosebleeds.   Eyes:  Negative for blurred vision.  Respiratory:  Negative for  cough, hemoptysis, sputum production, shortness of breath and wheezing.   Cardiovascular:  Negative for chest pain, palpitations, orthopnea and claudication.  Gastrointestinal:  Negative for abdominal pain, blood in stool, constipation, diarrhea, heartburn, melena, nausea and vomiting.  Genitourinary:  Negative for dysuria, flank pain, frequency, hematuria and urgency.  Musculoskeletal:  Negative for back pain, joint pain and myalgias.  Skin:  Negative for rash.  Neurological:  Negative for dizziness, tingling, focal weakness, seizures, weakness and headaches.  Endo/Heme/Allergies:  Does not bruise/bleed easily.  Psychiatric/Behavioral:  Negative for depression and suicidal ideas. The patient does not have insomnia.       No Known Allergies    Past Medical History:  Diagnosis Date   GERD (gastroesophageal reflux disease)    Panic attacks    Pneumonia    2018   Polycythemia      Past Surgical History:  Procedure Laterality Date   COLONOSCOPY WITH PROPOFOL N/A 06/18/2019   Procedure: COLONOSCOPY WITH BIOPSY;  Surgeon: Lucilla Lame, MD;  Location: [place];  Service: Endoscopy;  Laterality: N/A;   HERNIA REPAIR     INGUINAL HERNIA REPAIR Left 10/28/2015   Procedure: HERNIA REPAIR INGUINAL ADULT;  Surgeon: Hubbard Robinson, MD;  Location: [facility];  Service: General;  Laterality: Left;   POLYPECTOMY  06/18/2019   Procedure: POLYPECTOMY;  Surgeon: Lucilla Lame, MD;  Location: [place];  Service: Endoscopy;;   SKIN SURGERY     Tumor-benign   TONSILLECTOMY AND ADENOIDECTOMY     TUMOR REMOVAL     VENTRAL HERNIA REPAIR  1968   Dr. Mare Ferrari    Social History  Socioeconomic History   Marital status: Significant Other    Spouse name: Not on file   Number of children: Not on file   Years of education: Not on file   Highest education level: Not on file  Occupational History   Not on file  Tobacco Use   Smoking status: Every Day    Packs/day: 1.50     Years: 30.00    Pack years: 45.00    Types: Cigarettes    Start date: 10/22/1983    Last attempt to quit: 10/08/2015    Years since quitting: 5.6   Smokeless tobacco: Never   Tobacco comments:    off and on since 1985  Vaping Use   Vaping Use: Never used  Substance and Sexual Activity   Alcohol use: Yes    Alcohol/week: 8.0 standard drinks    Types: 8 Standard drinks or equivalent per week    Comment: pt has hardly any drinks during week and weekends drinks more mostly liquor and some beer   Drug use: Yes    Types: Marijuana    Comment: every now and then about 1 time month   Sexual activity: Yes  Other Topics Concern   Not on file  Social History Narrative   Not on file   Social Determinants of Health   Financial Resource Strain: Not on file  Food Insecurity: Not on file  Transportation Needs: Not on file  Physical Activity: Not on file  Stress: Not on file  Social Connections: Not on file  Intimate Partner Violence: Not on file    Family History  Problem Relation Age of Onset   Thyroid disease Mother    Heart disease Father        heart attack   Diabetes Father    Lung cancer Maternal Aunt    Cancer Maternal Aunt        lung cancer   Heart disease Paternal Uncle    Cancer Maternal Grandfather        lung cancer   Prostate cancer Brother 58   Lupus Brother    Prostate cancer Maternal Uncle      Current Outpatient Medications:    albuterol (VENTOLIN HFA) 108 (90 Base) MCG/ACT inhaler, Inhale 2 puffs into the lungs every 6 (six) hours as needed for wheezing or shortness of breath., Disp: 8 g, Rfl: 0   aspirin 81 MG EC tablet, Take 81 mg by mouth daily., Disp: , Rfl:    cetirizine (ZYRTEC) 10 MG tablet, Take 10 mg by mouth daily., Disp: , Rfl:    finasteride (PROSCAR) 5 MG tablet, Take 1 tablet (5 mg total) by mouth daily., Disp: 90 tablet, Rfl: 3   fluticasone (FLONASE) 50 MCG/ACT nasal spray, Place 2 sprays into both nostrils daily., Disp: 16 g, Rfl: 6    LORazepam (ATIVAN) 0.5 MG tablet, Take 1 tablet (0.5 mg total) by mouth daily as needed for anxiety., Disp: 10 tablet, Rfl: 0   Multiple Vitamin (MULTIVITAMIN) capsule, Take 1 capsule by mouth daily., Disp: , Rfl:    omeprazole (PRILOSEC) 10 MG capsule, Take 10 mg by mouth daily., Disp: , Rfl:    sildenafil (REVATIO) 20 MG tablet, Take 1 tablet (20 mg total) by mouth as needed. Take 1-5 tabs as needed prior to intercourse, Disp: 30 tablet, Rfl: 11   simvastatin (ZOCOR) 10 MG tablet, Take 1 tablet (10 mg total) by mouth at bedtime., Disp: 90 tablet, Rfl: 3   tamsulosin (FLOMAX) 0.4 MG CAPS capsule,  Take 1 capsule (0.4 mg total) by mouth daily., Disp: 90 capsule, Rfl: 3  Physical exam:  Vitals:   05/15/21 1425  BP: (!) 140/100  Pulse: 93  Resp: 16  Temp: 98.2 F (36.8 C)  SpO2: 97%  Weight: 226 lb 11.2 oz (102.8 kg)   Physical Exam Constitutional:      General: He is not in acute distress. Cardiovascular:     Rate and Rhythm: Normal rate and regular rhythm.     Heart sounds: Normal heart sounds.  Pulmonary:     Effort: Pulmonary effort is normal.     Breath sounds: Normal breath sounds.  Abdominal:     General: Bowel sounds are normal.     Palpations: Abdomen is soft.  Skin:    General: Skin is warm and dry.  Neurological:     Mental Status: He is alert and oriented to person, place, and time.     CMP Latest Ref Rng & Units 01/07/2021  Glucose 70 - 99 mg/dL 118(H)  BUN 6 - 20 mg/dL 14  Creatinine 0.61 - 1.24 mg/dL 1.04  Sodium 135 - 145 mmol/L 135  Potassium 3.5 - 5.1 mmol/L 4.4  Chloride 98 - 111 mmol/L 105  CO2 22 - 32 mmol/L 22  Calcium 8.9 - 10.3 mg/dL 8.3(L)  Total Protein 6.5 - 8.1 g/dL -  Total Bilirubin 0.3 - 1.2 mg/dL -  Alkaline Phos 38 - 126 U/L -  AST 15 - 41 U/L -  ALT 0 - 44 U/L -   CBC Latest Ref Rng & Units 05/15/2021  WBC 4.0 - 10.5 K/uL 12.3(H)  Hemoglobin 13.0 - 17.0 g/dL 17.6(H)  Hematocrit 39.0 - 52.0 % 52.3(H)  Platelets 150 - 400 K/uL 293     No images are attached to the encounter.  No results found.   Assessment and plan- Patient is a 55 y.o. male with secondary polycythemia here for routine follow-up  Patient's hematocrit is 52.3.  Reviewed that goal is to maintain hematocrit less than 55.  Therefore, he does not require phlebotomy.  Return to clinic in 6 months for labs, MD follow-up, and possible phlebotomy.   Visit Diagnosis 1. Secondary polycythemia     Beckey Rutter, [place], AGNP-C [place] at [facility] [phone] (clinic) 05/15/2021

## 2021-05-15 NOTE — Progress Notes (Signed)
No phlebotomy required today per Beckey Rutter NP.

## 2021-05-15 NOTE — Progress Notes (Signed)
Pt has no concerns/complaints at this time. 

## 2021-06-05 ENCOUNTER — Encounter: Payer: Self-pay | Admitting: Oncology

## 2021-07-13 ENCOUNTER — Encounter: Payer: Self-pay | Admitting: Internal Medicine

## 2021-07-13 ENCOUNTER — Ambulatory Visit (INDEPENDENT_AMBULATORY_CARE_PROVIDER_SITE_OTHER): Payer: BC Managed Care – PPO | Admitting: Internal Medicine

## 2021-07-13 ENCOUNTER — Other Ambulatory Visit: Payer: Self-pay

## 2021-07-13 VITALS — BP 112/82 | HR 98 | Temp 97.5°F | Ht 72.0 in | Wt 235.0 lb

## 2021-07-13 DIAGNOSIS — Z0000 Encounter for general adult medical examination without abnormal findings: Secondary | ICD-10-CM | POA: Diagnosis not present

## 2021-07-13 DIAGNOSIS — Z125 Encounter for screening for malignant neoplasm of prostate: Secondary | ICD-10-CM | POA: Diagnosis not present

## 2021-07-13 DIAGNOSIS — D485 Neoplasm of uncertain behavior of skin: Secondary | ICD-10-CM

## 2021-07-13 DIAGNOSIS — Z23 Encounter for immunization: Secondary | ICD-10-CM | POA: Diagnosis not present

## 2021-07-13 DIAGNOSIS — F17200 Nicotine dependence, unspecified, uncomplicated: Secondary | ICD-10-CM | POA: Diagnosis not present

## 2021-07-13 DIAGNOSIS — E782 Mixed hyperlipidemia: Secondary | ICD-10-CM | POA: Diagnosis not present

## 2021-07-13 DIAGNOSIS — F410 Panic disorder [episodic paroxysmal anxiety] without agoraphobia: Secondary | ICD-10-CM

## 2021-07-13 MED ORDER — LORAZEPAM 0.5 MG PO TABS: 0.5000 mg | ORAL_TABLET | Freq: Every day | ORAL | 0 refills | Status: DC | PRN

## 2021-07-13 NOTE — Progress Notes (Signed)
Date:  07/13/2021   Name:  Harold Carroll   DOB:  Dec 28, 1965   MRN:  [id number]   Chief Complaint: Annual Exam Harold Carroll is a 55 y.o. male who presents today for his Complete Annual Exam. He feels well. He reports exercising none. He reports he is sleeping well. He has recovered well from recent surgery for colo-vesicle fistula.  Colonoscopy: 06/2019 repeat 5 yrs  Immunization History  Administered Date(s) Administered   Influenza-Unspecified 05/16/2018, 05/23/2019, 05/08/2020, 05/02/2021   PFIZER(Purple Top)SARS-COV-2 Vaccination 06/04/2020, 06/24/2020   Tdap 09/24/2015    Lab Results  Component Value Date   PSA1 0.7 01/24/2019   PSA1 1.1 11/21/2017    Hyperlipidemia This is a chronic problem. The problem is controlled. Pertinent negatives include no chest pain, myalgias or shortness of breath. Current antihyperlipidemic treatment includes statins.  Gastroesophageal Reflux He complains of heartburn. He reports no abdominal pain, no chest pain, no choking or no wheezing. This is a recurrent problem. The problem occurs occasionally. Pertinent negatives include no fatigue. He has tried a PPI for the symptoms. The treatment provided significant relief.   Lab Results  Component Value Date   NA 135 01/07/2021   K 4.4 01/07/2021   CO2 22 01/07/2021   GLUCOSE 118 (H) 01/07/2021   BUN 14 01/07/2021   CREATININE 1.04 01/07/2021   CALCIUM 8.3 (L) 01/07/2021   GFRNONAA >60 01/07/2021   Lab Results  Component Value Date   CHOL 191 07/04/2020   HDL 46 07/04/2020   LDLCALC 120 (H) 07/04/2020   TRIG 140 07/04/2020   CHOLHDL 4.2 07/04/2020   Lab Results  Component Value Date   TSH 1.240 09/29/2015   Lab Results  Component Value Date   HGBA1C 5.6 09/29/2015   Lab Results  Component Value Date   WBC 12.3 (H) 05/15/2021   HGB 17.6 (H) 05/15/2021   HCT 52.3 (H) 05/15/2021   MCV 85.5 05/15/2021   PLT 293 05/15/2021   Lab Results  Component Value Date    ALT 27 11/14/2020   AST 27 11/14/2020   ALKPHOS 91 11/14/2020   BILITOT 0.9 11/14/2020   Lab Results  Component Value Date   VD25OH 30.0 09/29/2015     Review of Systems  Constitutional:  Negative for appetite change, chills, diaphoresis, fatigue and unexpected weight change.  HENT:  Negative for hearing loss, tinnitus, trouble swallowing and voice change.   Eyes:  Negative for visual disturbance.  Respiratory:  Negative for choking, shortness of breath and wheezing.   Cardiovascular:  Negative for chest pain, palpitations and leg swelling.  Gastrointestinal:  Positive for heartburn. Negative for abdominal pain, blood in stool, constipation and diarrhea.  Genitourinary:  Positive for decreased urine volume. Negative for difficulty urinating, dysuria and frequency.  Musculoskeletal:  Negative for arthralgias, back pain and myalgias.  Skin:  Negative for color change and rash.  Neurological:  Negative for dizziness, syncope and headaches.  Hematological:  Negative for adenopathy.  Psychiatric/Behavioral:  Negative for dysphoric mood and sleep disturbance. The patient is not nervous/anxious.    Patient Active Problem List   Diagnosis Date Noted   S/P laparoscopic colectomy 01/06/2021   Polyp of sigmoid colon    Polycythemia 02/19/2019   Mixed hyperlipidemia 10/02/2015   Overweight (BMI 25.0-29.9) 09/24/2015   Tobacco use disorder 09/24/2015   Panic disorder 09/24/2015   GERD (gastroesophageal reflux disease) 09/24/2015   Seasonal allergic rhinitis 09/24/2015    No Known Allergies  Past Surgical History:  Procedure Laterality Date   COLONOSCOPY WITH PROPOFOL N/A 06/18/2019   Procedure: COLONOSCOPY WITH BIOPSY;  Surgeon: Lucilla Lame, MD;  Location: [place];  Service: Endoscopy;  Laterality: N/A;   HERNIA REPAIR     INGUINAL HERNIA REPAIR Left 10/28/2015   Procedure: HERNIA REPAIR INGUINAL ADULT;  Surgeon: Hubbard Robinson, MD;  Location: [facility];  Service:  General;  Laterality: Left;   POLYPECTOMY  06/18/2019   Procedure: POLYPECTOMY;  Surgeon: Lucilla Lame, MD;  Location: [place];  Service: Endoscopy;;   ROBOT ASSISTED LAPAROSCOPIC PARTIAL COLECTOMY  2022   SKIN SURGERY     Tumor-benign   TONSILLECTOMY AND ADENOIDECTOMY     TUMOR REMOVAL     VENTRAL HERNIA REPAIR  1968   Dr. Mare Ferrari    Social History   Tobacco Use   Smoking status: Every Day    Packs/day: 1.50    Years: 30.00    Pack years: 45.00    Types: Cigarettes    Start date: 10/22/1983    Last attempt to quit: 10/08/2015    Years since quitting: 5.7   Smokeless tobacco: Never   Tobacco comments:    off and on since 1985  Vaping Use   Vaping Use: Never used  Substance Use Topics   Alcohol use: Yes    Alcohol/week: 8.0 standard drinks    Types: 8 Standard drinks or equivalent per week    Comment: pt has hardly any drinks during week and weekends drinks more mostly liquor and some beer   Drug use: Yes    Types: Marijuana    Comment: every now and then about 1 time month     Medication list has been reviewed and updated.  Current Meds  Medication Sig   albuterol (VENTOLIN HFA) 108 (90 Base) MCG/ACT inhaler Inhale 2 puffs into the lungs every 6 (six) hours as needed for wheezing or shortness of breath.   aspirin 81 MG EC tablet Take 81 mg by mouth daily.   cetirizine (ZYRTEC) 10 MG tablet Take 10 mg by mouth daily.   fluticasone (FLONASE) 50 MCG/ACT nasal spray Place 2 sprays into both nostrils daily.   LORazepam (ATIVAN) 0.5 MG tablet Take 1 tablet (0.5 mg total) by mouth daily as needed for anxiety.   Multiple Vitamin (MULTIVITAMIN) capsule Take 1 capsule by mouth daily.   omeprazole (PRILOSEC) 10 MG capsule Take 10 mg by mouth daily.   sildenafil (REVATIO) 20 MG tablet Take 1 tablet (20 mg total) by mouth as needed. Take 1-5 tabs as needed prior to intercourse   simvastatin (ZOCOR) 10 MG tablet Take 1 tablet (10 mg total) by mouth at bedtime.    tamsulosin (FLOMAX) 0.4 MG CAPS capsule Take 1 capsule (0.4 mg total) by mouth daily.    PHQ 2/9 Scores 07/13/2021 09/22/2020 07/04/2020 01/14/2020  PHQ - 2 Score 0 0 0 0  PHQ- 9 Score 0 0 0 0    GAD 7 : Generalized Anxiety Score 07/13/2021 09/22/2020 07/04/2020 01/14/2020  Nervous, Anxious, on Edge 0 0 0 0  Control/stop worrying 0 0 0 0  Worry too much - different things 0 0 0 0  Trouble relaxing 0 0 0 0  Restless 0 0 0 0  Easily annoyed or irritable 0 0 0 0  Afraid - awful might happen 0 0 0 0  Total GAD 7 Score 0 0 0 0  Anxiety Difficulty Not difficult at all Not difficult at all - Not difficult at all  BP Readings from Last 3 Encounters:  07/13/21 112/82  05/15/21 (!) 140/100  04/27/21 126/87    Physical Exam Vitals and nursing note reviewed.  Constitutional:      Appearance: Normal appearance. He is well-developed.  HENT:     Head: Normocephalic.     Right Ear: Tympanic membrane, ear canal and external ear normal.     Left Ear: Tympanic membrane, ear canal and external ear normal.     Nose: Nose normal.  Eyes:     Conjunctiva/sclera: Conjunctivae normal.     Pupils: Pupils are equal, round, and reactive to light.  Neck:     Thyroid: No thyromegaly.     Vascular: No carotid bruit.  Cardiovascular:     Rate and Rhythm: Normal rate and regular rhythm.     Heart sounds: Normal heart sounds.  Pulmonary:     Effort: Pulmonary effort is normal.     Breath sounds: Normal breath sounds. No wheezing.  Chest:  Breasts:    Right: No mass.     Left: No mass.  Abdominal:     General: Bowel sounds are normal.     Palpations: Abdomen is soft.     Tenderness: There is no abdominal tenderness.  Musculoskeletal:        General: Normal range of motion.     Cervical back: Normal range of motion and neck supple.  Lymphadenopathy:     Cervical: No cervical adenopathy.  Skin:    General: Skin is warm and dry.  Neurological:     Mental Status: He is alert and oriented to  person, place, and time.     Deep Tendon Reflexes: Reflexes are normal and symmetric.  Psychiatric:        Attention and Perception: Attention normal.        Mood and Affect: Mood normal.        Thought Content: Thought content normal.    Wt Readings from Last 3 Encounters:  07/13/21 235 lb (106.6 kg)  05/15/21 226 lb 11.2 oz (102.8 kg)  04/27/21 227 lb 9.6 oz (103.2 kg)    BP 112/82   Pulse 98   Temp (!) 97.5 F (36.4 C) (Oral)   Ht 6' (1.829 m)   Wt 235 lb (106.6 kg)   SpO2 95%   BMI 31.87 kg/m   Assessment and Plan: 1. Annual physical exam Exam is normal except for weight. Encourage regular exercise and appropriate dietary changes.  2. Prostate cancer screening DRE deferred to Urology - PSA  3. Mixed hyperlipidemia Tolerating statin medication without side effects at this time LDL is at goal of < 70 on current dose Continue same therapy without change at this time. - Comprehensive metabolic panel - Lipid panel  4. Tobacco use disorder - Ambulatory Referral Lung Cancer Screening [facility]  5. Panic disorder - LORazepam (ATIVAN) 0.5 MG tablet; Take 1 tablet (0.5 mg total) by mouth daily as needed for anxiety.  Dispense: 10 tablet; Refill: 0  6. Neoplasm of uncertain behavior of skin - Ambulatory referral to Dermatology  7. Need for vaccination for pneumococcus - Pneumococcal conjugate vaccine 20-valent   Partially dictated using Editor, commissioning. Any errors are unintentional.  Halina Maidens, MD [facility]  07/13/2021

## 2021-07-14 LAB — COMPREHENSIVE METABOLIC PANEL
ALT: 30 IU/L (ref 0–44)
AST: 30 IU/L (ref 0–40)
Albumin/Globulin Ratio: 1.7 (ref 1.2–2.2)
Albumin: 4.4 g/dL (ref 3.8–4.9)
Alkaline Phosphatase: 144 IU/L — ABNORMAL HIGH (ref 44–121)
BUN/Creatinine Ratio: 13 (ref 9–20)
BUN: 15 mg/dL (ref 6–24)
Bilirubin Total: 0.8 mg/dL (ref 0.0–1.2)
CO2: 24 mmol/L (ref 20–29)
Calcium: 9.7 mg/dL (ref 8.7–10.2)
Chloride: 97 mmol/L (ref 96–106)
Creatinine, Ser: 1.16 mg/dL (ref 0.76–1.27)
Globulin, Total: 2.6 g/dL (ref 1.5–4.5)
Glucose: 113 mg/dL — ABNORMAL HIGH (ref 70–99)
Potassium: 4.8 mmol/L (ref 3.5–5.2)
Sodium: 136 mmol/L (ref 134–144)
Total Protein: 7 g/dL (ref 6.0–8.5)
eGFR: 74 mL/min/{1.73_m2} (ref >59)

## 2021-07-14 LAB — LIPID PANEL
Chol/HDL Ratio: 5 ratio (ref 0.0–5.0)
Cholesterol, Total: 231 mg/dL — ABNORMAL HIGH (ref 100–199)
HDL: 46 mg/dL (ref >39)
LDL Chol Calc (NIH): 141 mg/dL — ABNORMAL HIGH (ref 0–99)
Triglycerides: 244 mg/dL — ABNORMAL HIGH (ref 0–149)
VLDL Cholesterol Cal: 44 mg/dL — ABNORMAL HIGH (ref 5–40)

## 2021-07-14 LAB — PSA: Prostate Specific Ag, Serum: 1.4 ng/mL (ref 0.0–4.0)

## 2021-08-14 ENCOUNTER — Other Ambulatory Visit: Payer: Self-pay

## 2021-08-19 ENCOUNTER — Ambulatory Visit: Payer: Self-pay | Admitting: Urology

## 2021-08-28 ENCOUNTER — Other Ambulatory Visit: Payer: Self-pay

## 2021-08-28 ENCOUNTER — Other Ambulatory Visit: Payer: BC Managed Care – PPO

## 2021-08-28 DIAGNOSIS — N401 Enlarged prostate with lower urinary tract symptoms: Secondary | ICD-10-CM

## 2021-08-28 DIAGNOSIS — R3914 Feeling of incomplete bladder emptying: Secondary | ICD-10-CM

## 2021-08-29 LAB — PSA: Prostate Specific Ag, Serum: 0.8 ng/mL (ref 0.0–4.0)

## 2021-09-01 NOTE — H&P (View-Only) (Signed)
09/02/21 1:43 PM   Harold Carroll Feb 15, 1966 [id number]  Referring provider:  Glean Hess, MD [address] [place],  [place] [address] Chief Complaint  Patient presents with   Benign Prostatic Hypertrophy     HPI: Harold Carroll is a 56 y.o.male with a personal history of BPH with LUTS, ED, and high risk hematuria, who presents today for a 6 month follow-up with IPSS, PVR, PSA,and DRE.   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  He did well following this procedure and has no evidence of recurrence of this.  No further UTIs.  He was scoped in July 2022 which revealed intravesical portion of the prostate extending into the bladder with friable mucosa/hypervascularity with diverticula.  Bladder was noted to be trabeculated.  He was placed on finasteride at that time in addition to Flomax due to obstructive urinary symptoms, elected maximal medical therapy rather than surgical intervention for his BPH.   His most recent PSA on 08/28/2021 was 0.8.   He has a family history of prostate cancer and bladder cancer.   He reports that he stopped taking finasteride due to swelling of his feet. He took this medication for 6 weeks.  He did not see difference on this medication.  He is now interested in consideration of an outlet procedure.  IPSS and PVR as below.   IPSS     Row Name 09/02/21 1300         International Prostate Symptom Score   How often have you had the sensation of not emptying your bladder? About half the time     How often have you had to urinate less than every two hours? About half the time     How often have you found you stopped and started again several times when you urinated? About half the time     How often have you found it difficult to postpone urination? About half the time     How often have you had a weak urinary stream? More than half the time     How often have you had to strain to start  urination? About half the time     How many times did you typically get up at night to urinate? 1 Time     Total IPSS Score 20       Quality of Life due to urinary symptoms   If you were to spend the rest of your life with your urinary condition just the way it is now how would you feel about that? Mostly Disatisfied              Score:  1-7 Mild 8-19 Moderate 20-35 Severe   PMH: Past Medical History:  Diagnosis Date   GERD (gastroesophageal reflux disease)    Panic attacks    Pneumonia    2018   Polycythemia     Surgical History: Past Surgical History:  Procedure Laterality Date   COLONOSCOPY WITH PROPOFOL N/A 06/18/2019   Procedure: COLONOSCOPY WITH BIOPSY;  Surgeon: Lucilla Lame, MD;  Location: [place];  Service: Endoscopy;  Laterality: N/A;   HERNIA REPAIR     INGUINAL HERNIA REPAIR Left 10/28/2015   Procedure: HERNIA REPAIR INGUINAL ADULT;  Surgeon: Hubbard Robinson, MD;  Location: [facility];  Service: General;  Laterality: Left;   POLYPECTOMY  06/18/2019   Procedure: POLYPECTOMY;  Surgeon: Lucilla Lame, MD;  Location: [place];  Service:  Endoscopy;;   ROBOT ASSISTED LAPAROSCOPIC PARTIAL COLECTOMY  2022   SKIN SURGERY     Tumor-benign   TONSILLECTOMY AND ADENOIDECTOMY     TUMOR REMOVAL     VENTRAL HERNIA REPAIR  1968   Dr. Mare Ferrari    Home Medications:  Allergies as of 09/02/2021   No Known Allergies      Medication List        Accurate as of September 02, 2021  1:43 PM. If you have any questions, ask your nurse or doctor.          STOP taking these medications    finasteride 5 MG tablet Commonly known as: PROSCAR Stopped by: Hollice Espy, MD       TAKE these medications    albuterol 108 (90 Base) MCG/ACT inhaler Commonly known as: VENTOLIN HFA Inhale 2 puffs into the lungs every 6 (six) hours as needed for wheezing or shortness of breath.   aspirin 81 MG EC tablet Take 81 mg by mouth daily.   cetirizine 10  MG tablet Commonly known as: ZYRTEC Take 10 mg by mouth daily.   fluticasone 50 MCG/ACT nasal spray Commonly known as: FLONASE Place 2 sprays into both nostrils daily.   LORazepam 0.5 MG tablet Commonly known as: ATIVAN Take 1 tablet (0.5 mg total) by mouth daily as needed for anxiety.   multivitamin capsule Take 1 capsule by mouth daily.   omeprazole 10 MG capsule Commonly known as: PRILOSEC Take 10 mg by mouth daily.   sildenafil 20 MG tablet Commonly known as: Revatio Take 1 tablet (20 mg total) by mouth as needed. Take 1-5 tabs as needed prior to intercourse   simvastatin 10 MG tablet Commonly known as: ZOCOR Take 1 tablet (10 mg total) by mouth at bedtime.   tamsulosin 0.4 MG Caps capsule Commonly known as: FLOMAX Take 1 capsule (0.4 mg total) by mouth daily.        Allergies: No Known Allergies  Family History: Family History  Problem Relation Age of Onset   Thyroid disease Mother    Heart disease Father        heart attack   Diabetes Father    Lung cancer Maternal Aunt    Cancer Maternal Aunt        lung cancer   Heart disease Paternal Uncle    Cancer Maternal Grandfather        lung cancer   Prostate cancer Brother 29   Lupus Brother    Prostate cancer Maternal Uncle     Social History:  reports that he has been smoking cigarettes. He started smoking about 37 years ago. He has a 45.00 pack-year smoking history. He has never used smokeless tobacco. He reports current alcohol use of about 8.0 standard drinks per week. He reports current drug use. Drug: Marijuana.   Physical Exam: BP (!) 141/96    Pulse 96    Ht 6' (1.829 m)    Wt 236 lb (107 kg)    BMI 32.01 kg/m   Constitutional:  Alert and oriented, No acute distress. HEENT: [place] AT, moist mucus membranes.  Trachea midline, no masses. Cardiovascular: No clubbing, cyanosis, or edema. Respiratory: Normal respiratory effort, no increased work of breathing. Skin: No rashes, bruises or suspicious  lesions. Neurologic: Grossly intact, no focal deficits, moving all 4 extremities. Psychiatric: Normal mood and affect.  Laboratory Data:  Lab Results  Component Value Date   CREATININE 1.16 07/13/2021   Lab Results  Component Value Date  HGBA1C 5.6 09/29/2015    Pertinent Imaging: Results for orders placed or performed in visit on 09/02/21  BLADDER SCAN AMB NON-IMAGING  Result Value Ref Range   Scan Result 147  ml     Assessment & Plan:    BPH with incomplete bladder emptying  - Worsening and no symptomatic relief on medications  - We discussed alternatives including TURP vs. holmium laser enucleation of the prostate vs. greenlight laser ablation.  Due to the presence of fairly significant well-circumscribed median lobe, he is a less ideal candidate for UroLift.  Differences between the surgical procedures were discussed as well as the risks and benefits of each.  He is most interested in HoLEP.  -We discussed the common postoperative course following holep including need for overnight Foley catheter, temporary worsening of irritative voiding symptoms, and occasional stress incontinence which typically lasts up to 6 months but can persist.  We discussed retrograde ejaculation and damage to surrounding structures including the urinary sphincter. Other uncommon complications including hematuria and urinary tract infection.  He understands all of the above and is willing to proceed as planned. - We discussed Kegel exercises in detail today, he was given a handout.  - Urine sent for preoperative culture    I,Kailey Littlejohn,acting as a scribe for Hollice Espy, MD.,have documented all relevant documentation on the behalf of Hollice Espy, MD,as directed by  Hollice Espy, MD while in the presence of Hollice Espy, MD.   I have reviewed the above documentation for accuracy and completeness, and I agree with the above.   Hollice Espy, MD   [facility] [address], [place], [place] [address] [phone]

## 2021-09-01 NOTE — Progress Notes (Signed)
09/02/21 1:43 PM   Harold Carroll 1966-07-25 [id number]  Referring provider:  Glean Hess, MD [address] [place],  [place] [address] Chief Complaint  Patient presents with   Benign Prostatic Hypertrophy     HPI: Harold Carroll is a 56 y.o.male with a personal history of BPH with LUTS, ED, and high risk hematuria, who presents today for a 6 month follow-up with IPSS, PVR, PSA,and DRE.   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  He did well following this procedure and has no evidence of recurrence of this.  No further UTIs.  He was scoped in July 2022 which revealed intravesical portion of the prostate extending into the bladder with friable mucosa/hypervascularity with diverticula.  Bladder was noted to be trabeculated.  He was placed on finasteride at that time in addition to Flomax due to obstructive urinary symptoms, elected maximal medical therapy rather than surgical intervention for his BPH.   His most recent PSA on 08/28/2021 was 0.8.   He has a family history of prostate cancer and bladder cancer.   He reports that he stopped taking finasteride due to swelling of his feet. He took this medication for 6 weeks.  He did not see difference on this medication.  He is now interested in consideration of an outlet procedure.  IPSS and PVR as below.   IPSS     Row Name 09/02/21 1300         International Prostate Symptom Score   How often have you had the sensation of not emptying your bladder? About half the time     How often have you had to urinate less than every two hours? About half the time     How often have you found you stopped and started again several times when you urinated? About half the time     How often have you found it difficult to postpone urination? About half the time     How often have you had a weak urinary stream? More than half the time     How often have you had to strain to start  urination? About half the time     How many times did you typically get up at night to urinate? 1 Time     Total IPSS Score 20       Quality of Life due to urinary symptoms   If you were to spend the rest of your life with your urinary condition just the way it is now how would you feel about that? Mostly Disatisfied              Score:  1-7 Mild 8-19 Moderate 20-35 Severe   PMH: Past Medical History:  Diagnosis Date   GERD (gastroesophageal reflux disease)    Panic attacks    Pneumonia    2018   Polycythemia     Surgical History: Past Surgical History:  Procedure Laterality Date   COLONOSCOPY WITH PROPOFOL N/A 06/18/2019   Procedure: COLONOSCOPY WITH BIOPSY;  Surgeon: Lucilla Lame, MD;  Location: [place];  Service: Endoscopy;  Laterality: N/A;   HERNIA REPAIR     INGUINAL HERNIA REPAIR Left 10/28/2015   Procedure: HERNIA REPAIR INGUINAL ADULT;  Surgeon: Hubbard Robinson, MD;  Location: [facility];  Service: General;  Laterality: Left;   POLYPECTOMY  06/18/2019   Procedure: POLYPECTOMY;  Surgeon: Lucilla Lame, MD;  Location: [facility];  Service:  Endoscopy;;   ROBOT ASSISTED LAPAROSCOPIC PARTIAL COLECTOMY  2022   SKIN SURGERY     Tumor-benign   TONSILLECTOMY AND ADENOIDECTOMY     TUMOR REMOVAL     VENTRAL HERNIA REPAIR  1968   Dr. Mare Ferrari    Home Medications:  Allergies as of 09/02/2021   No Known Allergies      Medication List        Accurate as of September 02, 2021  1:43 PM. If you have any questions, ask your nurse or doctor.          STOP taking these medications    finasteride 5 MG tablet Commonly known as: PROSCAR Stopped by: Hollice Espy, MD       TAKE these medications    albuterol 108 (90 Base) MCG/ACT inhaler Commonly known as: VENTOLIN HFA Inhale 2 puffs into the lungs every 6 (six) hours as needed for wheezing or shortness of breath.   aspirin 81 MG EC tablet Take 81 mg by mouth daily.   cetirizine 10  MG tablet Commonly known as: ZYRTEC Take 10 mg by mouth daily.   fluticasone 50 MCG/ACT nasal spray Commonly known as: FLONASE Place 2 sprays into both nostrils daily.   LORazepam 0.5 MG tablet Commonly known as: ATIVAN Take 1 tablet (0.5 mg total) by mouth daily as needed for anxiety.   multivitamin capsule Take 1 capsule by mouth daily.   omeprazole 10 MG capsule Commonly known as: PRILOSEC Take 10 mg by mouth daily.   sildenafil 20 MG tablet Commonly known as: Revatio Take 1 tablet (20 mg total) by mouth as needed. Take 1-5 tabs as needed prior to intercourse   simvastatin 10 MG tablet Commonly known as: ZOCOR Take 1 tablet (10 mg total) by mouth at bedtime.   tamsulosin 0.4 MG Caps capsule Commonly known as: FLOMAX Take 1 capsule (0.4 mg total) by mouth daily.        Allergies: No Known Allergies  Family History: Family History  Problem Relation Age of Onset   Thyroid disease Mother    Heart disease Father        heart attack   Diabetes Father    Lung cancer Maternal Aunt    Cancer Maternal Aunt        lung cancer   Heart disease Paternal Uncle    Cancer Maternal Grandfather        lung cancer   Prostate cancer Brother 71   Lupus Brother    Prostate cancer Maternal Uncle     Social History:  reports that he has been smoking cigarettes. He started smoking about 37 years ago. He has a 45.00 pack-year smoking history. He has never used smokeless tobacco. He reports current alcohol use of about 8.0 standard drinks per week. He reports current drug use. Drug: Marijuana.   Physical Exam: BP (!) 141/96    Pulse 96    Ht 6' (1.829 m)    Wt 236 lb (107 kg)    BMI 32.01 kg/m   Constitutional:  Alert and oriented, No acute distress. HEENT: [place] AT, moist mucus membranes.  Trachea midline, no masses. Cardiovascular: No clubbing, cyanosis, or edema. Respiratory: Normal respiratory effort, no increased work of breathing. Skin: No rashes, bruises or suspicious  lesions. Neurologic: Grossly intact, no focal deficits, moving all 4 extremities. Psychiatric: Normal mood and affect.  Laboratory Data:  Lab Results  Component Value Date   CREATININE 1.16 07/13/2021   Lab Results  Component Value Date  HGBA1C 5.6 09/29/2015    Pertinent Imaging: Results for orders placed or performed in visit on 09/02/21  BLADDER SCAN AMB NON-IMAGING  Result Value Ref Range   Scan Result 147  ml     Assessment & Plan:    BPH with incomplete bladder emptying  - Worsening and no symptomatic relief on medications  - We discussed alternatives including TURP vs. holmium laser enucleation of the prostate vs. greenlight laser ablation.  Due to the presence of fairly significant well-circumscribed median lobe, he is a less ideal candidate for UroLift.  Differences between the surgical procedures were discussed as well as the risks and benefits of each.  He is most interested in HoLEP.  -We discussed the common postoperative course following holep including need for overnight Foley catheter, temporary worsening of irritative voiding symptoms, and occasional stress incontinence which typically lasts up to 6 months but can persist.  We discussed retrograde ejaculation and damage to surrounding structures including the urinary sphincter. Other uncommon complications including hematuria and urinary tract infection.  He understands all of the above and is willing to proceed as planned. - We discussed Kegel exercises in detail today, he was given a handout.  - Urine sent for preoperative culture    I,Kailey Littlejohn,acting as a scribe for Hollice Espy, MD.,have documented all relevant documentation on the behalf of Hollice Espy, MD,as directed by  Hollice Espy, MD while in the presence of Hollice Espy, MD.   I have reviewed the above documentation for accuracy and completeness, and I agree with the above.   Hollice Espy, MD   [facility] [address] [place], [place] [address] [phone]

## 2021-09-02 ENCOUNTER — Ambulatory Visit: Payer: BC Managed Care – PPO | Admitting: Urology

## 2021-09-02 ENCOUNTER — Encounter: Payer: Self-pay | Admitting: Urology

## 2021-09-02 ENCOUNTER — Other Ambulatory Visit: Payer: Self-pay

## 2021-09-02 VITALS — BP 141/96 | HR 96 | Ht 72.0 in | Wt 236.0 lb

## 2021-09-02 DIAGNOSIS — N401 Enlarged prostate with lower urinary tract symptoms: Secondary | ICD-10-CM

## 2021-09-02 DIAGNOSIS — R3914 Feeling of incomplete bladder emptying: Secondary | ICD-10-CM

## 2021-09-02 LAB — BLADDER SCAN AMB NON-IMAGING: Scan Result: 147

## 2021-09-02 NOTE — Patient Instructions (Signed)

## 2021-09-03 ENCOUNTER — Telehealth: Payer: Self-pay | Admitting: Urology

## 2021-09-03 LAB — URINALYSIS, COMPLETE
Bilirubin, UA: NEGATIVE
Glucose, UA: NEGATIVE
Ketones, UA: NEGATIVE
Leukocytes,UA: NEGATIVE
Nitrite, UA: NEGATIVE
Protein,UA: NEGATIVE
RBC, UA: NEGATIVE
Specific Gravity, UA: 1.02 (ref 1.005–1.030)
Urobilinogen, Ur: 0.2 mg/dL (ref 0.2–1.0)
pH, UA: 6 (ref 5.0–7.5)

## 2021-09-03 LAB — MICROSCOPIC EXAMINATION
Bacteria, UA: NONE SEEN
RBC, Urine: NONE SEEN /hpf (ref 0–2)

## 2021-09-03 NOTE — Telephone Encounter (Signed)
Surgical Physician Order Form [facility] [place]  * Scheduling expectation : Next Available  *Length of Case:   *Clearance needed: no  *Anticoagulation Instructions: Hold all anticoagulants  *Aspirin Instructions: Ok to continue Aspirin  *Post-op visit Date/Instructions:  1-3 day cath removal  *Diagnosis: BPH w/urinary obstruction  *Procedure:     HOLEP (17981)   Additional orders: N/A  -Admit type: OUTpatient  -Anesthesia: General  -VTE Prophylaxis Standing Order SCDs       Other:   -Standing Lab Orders Per Anesthesia    Lab other: None  -Standing Test orders EKG/Chest x-ray per Anesthesia       Test other:   - Medications:  Ancef 2gm IV  -Other orders:  N/A

## 2021-09-05 LAB — CULTURE, URINE COMPREHENSIVE

## 2021-09-09 ENCOUNTER — Other Ambulatory Visit: Payer: Self-pay

## 2021-09-09 DIAGNOSIS — N401 Enlarged prostate with lower urinary tract symptoms: Secondary | ICD-10-CM

## 2021-09-09 DIAGNOSIS — N138 Other obstructive and reflux uropathy: Secondary | ICD-10-CM

## 2021-09-09 NOTE — Progress Notes (Signed)
[facility] Posting Form   Surgery Date/Time: Date: 09/21/2021  Surgeon: Dr. Hollice Espy, MD  Surgery Location: Day Surgery  Inpt ( No  )   Outpt (Yes)   Obs ( No  )   Diagnosis: N40.1 N13.8 Benign Prostatic Hyperplasia with urinary obstruction  -CPT: 02725  Surgery: Holmium Laser Enucleation of the Prostate  Stop Anticoagulations: Yes, may continue ASA  Cardiac/Medical/Pulmonary Clearance needed: no  *Orders entered into EPIC  Date: 09/09/21   *Case booked in EPIC  Date: 09/09/21  *Notified pt of Surgery: 09/04/2021  PRE-OP UA & CX: no   *Placed into Prior Authorization Work Que Date: 09/09/21   Assistant/laser/rep:No               /

## 2021-09-09 NOTE — Telephone Encounter (Signed)
I spoke with Harold Carroll. We have discussed possible surgery dates and Monday February 20th, 2023 was agreed upon by all parties. Patient given information about surgery date, what to expect pre-operatively and post operatively.   We discussed that a Pre-Admission Testing office will be calling to set up the pre-op visit that will take place prior to surgery, and that these appointments are typically done over the phone with a Pre-Admissions RN.   Informed patient that our office will communicate any additional care to be provided after surgery. Patients questions or concerns were discussed during our call. Advised to call our office should there be any additional information, questions or concerns that arise. Patient verbalized understanding.

## 2021-09-15 ENCOUNTER — Other Ambulatory Visit: Payer: Self-pay

## 2021-09-15 ENCOUNTER — Other Ambulatory Visit
Admission: RE | Admit: 2021-09-15 | Discharge: 2021-09-15 | Disposition: A | Discharge status: Home / Self Care | Payer: BC Managed Care – PPO | Source: Ambulatory Visit | Attending: Urology | Admitting: Urology

## 2021-09-15 NOTE — Patient Instructions (Signed)
Your procedure is scheduled on: Monday September 21, 2021. Report to Day Surgery inside [facility] 2nd floor, stop by admissions desk before getting on elevator.  To find out your arrival time please call [phone] between 1PM - 3PM on Friday September 18, 2021.  Remember: Instructions that are not followed completely may result in serious medical risk,  up to and including death, or upon the discretion of your surgeon and anesthesiologist your  surgery may need to be rescheduled.     _X__ 1. Do not eat food or drink fluids after midnight the night before your procedure.                 No chewing gum or hard candies.   __X__2.  On the morning of surgery brush your teeth with toothpaste and water, you                may rinse your mouth with mouthwash if you wish.  Do not swallow any toothpaste or mouthwash.     _X__ 3.  No Alcohol for 24 hours before or after surgery.   _X__ 4.  Do Not Smoke or use e-cigarettes For 24 Hours Prior to Your Surgery.                 Do not use any chewable tobacco products for at least 6 hours prior to                 Surgery.  _X__  5.  Do not use any recreational drugs (marijuana, cocaine, heroin, ecstasy, MDMA or other)                For at least one week prior to your surgery.  Combination of these drugs with anesthesia                May have life threatening results.  ____  6.  Bring all medications with you on the day of surgery if instructed.   __X_ 7.  Notify your doctor if there is any change in your medical condition      (cold, fever, infections).     Do not wear jewelry, make-up, hairpins, clips or nail polish. Do not wear lotions, powders, or perfumes. You may wear deodorant. Do not shave 48 hours prior to surgery. Men may shave face and neck. Do not bring valuables to the hospital.    [facility] is not responsible for any belongings or valuables.  Contacts, dentures or bridgework may not be worn into  surgery. Leave your suitcase in the car. After surgery it may be brought to your room. For patients admitted to the hospital, discharge time is determined by your treatment team.   Patients discharged the day of surgery will not be allowed to drive home.   Make arrangements for someone to be with you for the first 24 hours of your Same Day Discharge.   __X__ Take these medicines the morning of surgery with A SIP OF WATER:    1. omeprazole (PRILOSEC) 10 MG   2.   3.   4.  5.  6.  ____ Fleet Enema (as directed)   ____ Use CHG Soap (or wipes) as directed  ____ Use Benzoyl Peroxide Gel as instructed  ____ Use inhalers on the day of surgery  ____ Stop metformin 2 days prior to surgery    ____ Take 1/2 of usual insulin dose the night before surgery. No insulin the morning  of surgery.   ____ Call your PCP, cardiologist, or Pulmonologist if taking Coumadin/Plavix/aspirin and ask when to stop before your surgery.   __X__ One Week prior to surgery- Stop Anti-inflammatories such as Ibuprofen, Aleve, Advil, Motrin, meloxicam (MOBIC), diclofenac, etodolac, ketorolac, Toradol, Daypro, piroxicam, Goody's or BC powders. OK TO USE TYLENOL IF NEEDED   __X__ Stop supplements until after surgery.    ____ Bring C-Pap to the hospital.    If you have any questions regarding your pre-procedure instructions,  Please call Pre-admit Testing at [phone]

## 2021-09-20 MED ORDER — ORAL CARE MOUTH RINSE: 15.0000 mL | Freq: Once | OROMUCOSAL | Status: AC

## 2021-09-20 MED ORDER — LACTATED RINGERS IV SOLN: INTRAVENOUS | Status: DC

## 2021-09-20 MED ORDER — CHLORHEXIDINE GLUCONATE 0.12 % MT SOLN: 15.0000 mL | Freq: Once | OROMUCOSAL | Status: AC

## 2021-09-20 MED ORDER — CEFAZOLIN SODIUM-DEXTROSE 2-4 GM/100ML-% IV SOLN
2.0000 g | INTRAVENOUS | Status: AC
  Administered 2021-09-21: 2 g via INTRAVENOUS

## 2021-09-21 ENCOUNTER — Encounter: Payer: Self-pay | Admitting: Urology

## 2021-09-21 ENCOUNTER — Ambulatory Visit: Payer: BC Managed Care – PPO | Admitting: Certified Registered"

## 2021-09-21 ENCOUNTER — Ambulatory Visit
Admission: RE | Admit: 2021-09-21 | Discharge: 2021-09-21 | Disposition: A | Discharge status: Home / Self Care | Payer: BC Managed Care – PPO | Attending: Urology | Admitting: Urology

## 2021-09-21 ENCOUNTER — Other Ambulatory Visit: Payer: Self-pay

## 2021-09-21 ENCOUNTER — Encounter
Admission: RE | Disposition: A | Discharge status: Home / Self Care | Payer: Self-pay | Source: Home / Self Care | Attending: Urology

## 2021-09-21 DIAGNOSIS — Z8052 Family history of malignant neoplasm of bladder: Secondary | ICD-10-CM | POA: Insufficient documentation

## 2021-09-21 DIAGNOSIS — N138 Other obstructive and reflux uropathy: Secondary | ICD-10-CM

## 2021-09-21 DIAGNOSIS — K219 Gastro-esophageal reflux disease without esophagitis: Secondary | ICD-10-CM | POA: Insufficient documentation

## 2021-09-21 DIAGNOSIS — N401 Enlarged prostate with lower urinary tract symptoms: Secondary | ICD-10-CM | POA: Insufficient documentation

## 2021-09-21 DIAGNOSIS — F17200 Nicotine dependence, unspecified, uncomplicated: Secondary | ICD-10-CM | POA: Insufficient documentation

## 2021-09-21 DIAGNOSIS — N320 Bladder-neck obstruction: Secondary | ICD-10-CM | POA: Insufficient documentation

## 2021-09-21 DIAGNOSIS — F419 Anxiety disorder, unspecified: Secondary | ICD-10-CM | POA: Insufficient documentation

## 2021-09-21 DIAGNOSIS — Z8042 Family history of malignant neoplasm of prostate: Secondary | ICD-10-CM | POA: Insufficient documentation

## 2021-09-21 DIAGNOSIS — Z8719 Personal history of other diseases of the digestive system: Secondary | ICD-10-CM | POA: Insufficient documentation

## 2021-09-21 HISTORY — PX: HOLEP-LASER ENUCLEATION OF THE PROSTATE WITH MORCELLATION: SHX6641

## 2021-09-21 SURGERY — ENUCLEATION, PROSTATE, USING LASER, WITH MORCELLATION
Anesthesia: General

## 2021-09-21 MED ORDER — PHENYLEPHRINE HCL (PRESSORS) 10 MG/ML IV SOLN
INTRAVENOUS | Status: AC
  Filled 2021-09-21: qty 1

## 2021-09-21 MED ORDER — MIDAZOLAM HCL 2 MG/2ML IJ SOLN
INTRAMUSCULAR | Status: AC
  Filled 2021-09-21: qty 2

## 2021-09-21 MED ORDER — ONDANSETRON HCL 4 MG/2ML IJ SOLN: 4.0000 mg | Freq: Once | INTRAMUSCULAR | Status: DC | PRN

## 2021-09-21 MED ORDER — CHLORHEXIDINE GLUCONATE 0.12 % MT SOLN
OROMUCOSAL | Status: AC
  Administered 2021-09-21: 15 mL via OROMUCOSAL
  Filled 2021-09-21: qty 15

## 2021-09-21 MED ORDER — FENTANYL CITRATE (PF) 100 MCG/2ML IJ SOLN
INTRAMUSCULAR | Status: AC
  Filled 2021-09-21: qty 2

## 2021-09-21 MED ORDER — OXYCODONE HCL 5 MG/5ML PO SOLN: 5.0000 mg | Freq: Once | ORAL | Status: DC | PRN

## 2021-09-21 MED ORDER — ACETAMINOPHEN 10 MG/ML IV SOLN
INTRAVENOUS | Status: AC
  Filled 2021-09-21: qty 100

## 2021-09-21 MED ORDER — ONDANSETRON HCL 4 MG/2ML IJ SOLN
INTRAMUSCULAR | Status: DC | PRN
  Administered 2021-09-21: 4 mg via INTRAVENOUS

## 2021-09-21 MED ORDER — DEXAMETHASONE SODIUM PHOSPHATE 10 MG/ML IJ SOLN
INTRAMUSCULAR | Status: AC
  Filled 2021-09-21: qty 1

## 2021-09-21 MED ORDER — STERILE WATER FOR IRRIGATION IR SOLN
Status: DC | PRN
  Administered 2021-09-21: 500 mL

## 2021-09-21 MED ORDER — PROPOFOL 10 MG/ML IV BOLUS
INTRAVENOUS | Status: DC | PRN
  Administered 2021-09-21: 150 mg via INTRAVENOUS

## 2021-09-21 MED ORDER — FENTANYL CITRATE (PF) 100 MCG/2ML IJ SOLN
INTRAMUSCULAR | Status: DC | PRN
  Administered 2021-09-21: 100 ug via INTRAVENOUS

## 2021-09-21 MED ORDER — PHENYLEPHRINE 40 MCG/ML (10ML) SYRINGE FOR IV PUSH (FOR BLOOD PRESSURE SUPPORT)
PREFILLED_SYRINGE | INTRAVENOUS | Status: DC | PRN
  Administered 2021-09-21: 160 ug via INTRAVENOUS

## 2021-09-21 MED ORDER — ONDANSETRON HCL 4 MG/2ML IJ SOLN
INTRAMUSCULAR | Status: AC
  Filled 2021-09-21: qty 2

## 2021-09-21 MED ORDER — ROCURONIUM BROMIDE 100 MG/10ML IV SOLN
INTRAVENOUS | Status: DC | PRN
  Administered 2021-09-21: 60 mg via INTRAVENOUS
  Administered 2021-09-21: 20 mg via INTRAVENOUS

## 2021-09-21 MED ORDER — MIDAZOLAM HCL 2 MG/2ML IJ SOLN
INTRAMUSCULAR | Status: DC | PRN
  Administered 2021-09-21: 2 mg via INTRAVENOUS

## 2021-09-21 MED ORDER — FUROSEMIDE 10 MG/ML IJ SOLN
INTRAMUSCULAR | Status: AC
  Filled 2021-09-21: qty 4

## 2021-09-21 MED ORDER — HYDROCODONE-ACETAMINOPHEN 5-325 MG PO TABS: 1.0000 | ORAL_TABLET | Freq: Four times a day (QID) | ORAL | 0 refills | Status: DC | PRN

## 2021-09-21 MED ORDER — ACETAMINOPHEN 10 MG/ML IV SOLN
INTRAVENOUS | Status: DC | PRN
Start: 2021-09-21 — End: 2021-09-21
  Administered 2021-09-21: 1000 mg via INTRAVENOUS

## 2021-09-21 MED ORDER — SUGAMMADEX SODIUM 500 MG/5ML IV SOLN
INTRAVENOUS | Status: DC | PRN
  Administered 2021-09-21: 200 mg via INTRAVENOUS

## 2021-09-21 MED ORDER — OXYCODONE HCL 5 MG PO TABS: 5.0000 mg | ORAL_TABLET | Freq: Once | ORAL | Status: DC | PRN

## 2021-09-21 MED ORDER — LIDOCAINE HCL (CARDIAC) PF 100 MG/5ML IV SOSY
PREFILLED_SYRINGE | INTRAVENOUS | Status: DC | PRN
Start: 2021-09-21 — End: 2021-09-21
  Administered 2021-09-21: 100 mg via INTRAVENOUS

## 2021-09-21 MED ORDER — SODIUM CHLORIDE 0.9 % IR SOLN
Status: DC | PRN
  Administered 2021-09-21: 24000 mL

## 2021-09-21 MED ORDER — ROCURONIUM BROMIDE 10 MG/ML (PF) SYRINGE
PREFILLED_SYRINGE | INTRAVENOUS | Status: AC
  Filled 2021-09-21: qty 10

## 2021-09-21 MED ORDER — PROPOFOL 10 MG/ML IV BOLUS
INTRAVENOUS | Status: AC
  Filled 2021-09-21: qty 20

## 2021-09-21 MED ORDER — OXYBUTYNIN CHLORIDE 5 MG PO TABS
5.0000 mg | ORAL_TABLET | Freq: Three times a day (TID) | ORAL | 0 refills | Status: DC | PRN
Start: 2021-09-21 — End: 2022-06-16

## 2021-09-21 MED ORDER — LIDOCAINE HCL (PF) 2 % IJ SOLN
INTRAMUSCULAR | Status: AC
  Filled 2021-09-21: qty 5

## 2021-09-21 MED ORDER — FUROSEMIDE 10 MG/ML IJ SOLN
INTRAMUSCULAR | Status: DC | PRN
  Administered 2021-09-21: 10 mg via INTRAMUSCULAR

## 2021-09-21 MED ORDER — CEFAZOLIN SODIUM-DEXTROSE 2-4 GM/100ML-% IV SOLN
INTRAVENOUS | Status: AC
  Filled 2021-09-21: qty 100

## 2021-09-21 MED ORDER — FENTANYL CITRATE (PF) 100 MCG/2ML IJ SOLN: 25.0000 ug | INTRAMUSCULAR | Status: DC | PRN

## 2021-09-21 MED ORDER — DEXAMETHASONE SODIUM PHOSPHATE 10 MG/ML IJ SOLN
INTRAMUSCULAR | Status: DC | PRN
  Administered 2021-09-21: 5 mg via INTRAVENOUS

## 2021-09-21 SURGICAL SUPPLY — 37 items
ADAPTER IRRIG TUBE 2 SPIKE SOL (ADAPTER) ×4 IMPLANT
ADPR TBG 2 SPK PMP STRL ASCP (ADAPTER) ×2
BAG DRN LRG CPC RND TRDRP CNTR (MISCELLANEOUS)
BAG DRN RND TRDRP ANRFLXCHMBR (UROLOGICAL SUPPLIES)
BAG URINE DRAIN 2000ML AR STRL (UROLOGICAL SUPPLIES) IMPLANT
BAG URO DRAIN 4000ML (MISCELLANEOUS) IMPLANT
CATH FOL 2WAY LX 20X30 (CATHETERS) ×1 IMPLANT
CATH FOL 2WAY LX 22X30 (CATHETERS) IMPLANT
CATH FOLEY 3WAY 30CC 22FR (CATHETERS) IMPLANT
CATH URETL OPEN 5X70 (CATHETERS) ×2 IMPLANT
CONTAINER COLLECT MORCELLATR (MISCELLANEOUS) ×1 IMPLANT
DRAPE 3/4 80X56 (DRAPES) ×2 IMPLANT
DRAPE UTILITY 15X26 TOWEL STRL (DRAPES) IMPLANT
FIBER LASER FLEXIVA PULSE 550 (Laser) ×2 IMPLANT
FILTER OVERFLOW MORCELLATOR (FILTER) ×1 IMPLANT
GAUZE 4X4 16PLY ~~LOC~~+RFID DBL (SPONGE) ×2 IMPLANT
GLOVE SURG ENC MOIS LTX SZ6.5 (GLOVE) ×4 IMPLANT
GOWN STRL REUS W/ TWL LRG LVL3 (GOWN DISPOSABLE) ×2 IMPLANT
GOWN STRL REUS W/TWL LRG LVL3 (GOWN DISPOSABLE) ×4
HOLDER FOLEY CATH W/STRAP (MISCELLANEOUS) ×2 IMPLANT
IV NS IRRIG 3000ML ARTHROMATIC (IV SOLUTION) ×12 IMPLANT
KIT TURNOVER CYSTO (KITS) ×2 IMPLANT
MBRN O SEALING YLW 17 FOR INST (MISCELLANEOUS) ×2
MEMBRANE SLNG YLW 17 FOR INST (MISCELLANEOUS) ×1 IMPLANT
MORCELLATOR COLLECT CONTAINER (MISCELLANEOUS) ×2
MORCELLATOR OVERFLOW FILTER (FILTER) ×2
MORCELLATOR ROTATION 4.75 335 (MISCELLANEOUS) ×2 IMPLANT
PACK CYSTO AR (MISCELLANEOUS) ×2 IMPLANT
SET CYSTO W/LG BORE CLAMP LF (SET/KITS/TRAYS/PACK) IMPLANT
SET IRRIG Y TYPE TUR BLADDER L (SET/KITS/TRAYS/PACK) ×2 IMPLANT
SLEEVE PROTECTION STRL DISP (MISCELLANEOUS) ×4 IMPLANT
SURGILUBE 2OZ TUBE FLIPTOP (MISCELLANEOUS) ×2 IMPLANT
SYR TOOMEY IRRIG 70ML (MISCELLANEOUS) ×2
SYRINGE TOOMEY IRRIG 70ML (MISCELLANEOUS) ×1 IMPLANT
TUBE PUMP MORCELLATOR PIRANHA (TUBING) ×2 IMPLANT
WATER STERILE IRR 1000ML POUR (IV SOLUTION) ×1 IMPLANT
WATER STERILE IRR 500ML POUR (IV SOLUTION) ×2 IMPLANT

## 2021-09-21 NOTE — Transfer of Care (Signed)
Immediate Anesthesia Transfer of Care Note  Patient: Harold Carroll  Procedure(s) Performed: HOLEP-LASER ENUCLEATION OF THE PROSTATE WITH MORCELLATION  Patient Location: PACU  Anesthesia Type:General  Level of Consciousness: drowsy  Airway & Oxygen Therapy: Patient Spontanous Breathing and Patient connected to face mask oxygen  Post-op Assessment: Report given to RN and Post -op Vital signs reviewed and stable  Post vital signs: Reviewed and stable  Last Vitals:  Vitals Value Taken Time  BP 106/63 09/21/21 1314  Temp 36 C 09/21/21 1314  Pulse 72 09/21/21 1320  Resp 18 09/21/21 1320  SpO2 97 % 09/21/21 1320  Vitals shown include unvalidated device data.  Last Pain:  Vitals:   09/21/21 1127  TempSrc: Temporal  PainSc: 0-No pain         Complications: No notable events documented.

## 2021-09-21 NOTE — Anesthesia Postprocedure Evaluation (Signed)
Anesthesia Post Note  Patient: Harold Carroll  Procedure(s) Performed: HOLEP-LASER ENUCLEATION OF THE PROSTATE WITH MORCELLATION  Patient location during evaluation: PACU Anesthesia Type: General Level of consciousness: awake and alert Pain management: pain level controlled Vital Signs Assessment: post-procedure vital signs reviewed and stable Respiratory status: spontaneous breathing, nonlabored ventilation, respiratory function stable and patient connected to nasal cannula oxygen Cardiovascular status: blood pressure returned to baseline and stable Postop Assessment: no apparent nausea or vomiting Anesthetic complications: no   No notable events documented.   Last Vitals:  Vitals:   09/21/21 1335 09/21/21 1340  BP:    Pulse: 77 74  Resp: (!) 21 16  Temp: (!) 36.2 C   SpO2: 98% 95%    Last Pain:  Vitals:   09/21/21 1335  TempSrc:   PainSc: 0-No pain                 Arita Miss

## 2021-09-21 NOTE — Discharge Instructions (Addendum)
Holmium Laser Enucleation of the Prostate (HoLEP)  HoLEP is a treatment for men with benign prostatic hyperplasia (BPH). The laser surgery removed blockages of urine flow, and is done without any incisions on the body.     What is HoLEP?  HoLEP is a type of laser surgery used to treat obstruction (blockage) of urine flow as a result of benign prostatic hyperplasia (BPH). In men with BPH, the prostate gland is not cancerous, but has become enlarged. An enlarged prostate can result in a number of urinary tract symptoms such as weak urinary stream, difficulty in starting urination, inability to urinate, frequent urination, or getting up at night to urinate.  HoLEP was developed in the 1990's as a more effective and less expensive surgical option for BPH, compared to other surgical options such as laser vaporization(PVP/greenlight laser), transurethral resection of the prostate(TURP), and open simple prostatectomy.   What happens during a HoLEP?  HoLEP requires general anesthesia ("asleep" throughout the procedure).   An antibiotic is given to reduce the risk of infection  A surgical instrument called a resectoscope is inserted through the urethra (the tube that carries urine from the bladder). The resectoscope has a camera that allows the surgeon to view the internal structure of the prostate gland, and to see where the incisions are being made during surgery.  The laser is inserted into the resectoscope and is used to enucleate (free up) the enlarged prostate tissue from the capsule (outer shell) and then to seal up any blood vessels. The tissue that has been removed is pushed back into the bladder.  A morcellator is placed through the resectoscope, and is used to suction out the prostate tissue that has been pushed into the bladder.  When the prostate tissue has been removed, the resectoscope is removed, and a foley catheter is placed to allow healing and drain the urine from the  bladder.     What happens after a HoLEP?  More than 90% of patients go home the same day a few hours after surgery. Less than 10% will be admitted to the hospital overnight for observation to monitor the urine, or if they have other medical problems.  Fluid is flushed through the catheter for about 1 hour after surgery to clear any blood from the urine. It is normal to have some blood in the urine after surgery. The need for blood transfusion is extremely rare.  Eating and drinking are permitted after the procedure once the patient has fully awakened from anesthesia.  The catheter is usually removed 2-3 days after surgery- the patient will come to clinic to have the catheter removed and make sure they can urinate on their own.  It is very important to drink lots of fluids after surgery for one week to keep the bladder flushed.  At first, there may be some burning with urination, but this typically improved within a few hours to days. Most patients do not have a significant amount of pain, and narcotic pain medications are rarely needed.  Symptoms of urinary frequency, urgency, and even leakage are NORMAL for the first few weeks after surgery as the bladder adjusts after having to work hard against blockage from the prostate for many years. This will improve, but can sometimes take several months.  The use of pelvic floor exercises (Kegel exercises) can help improve problems with urinary incontinence.   After catheter removal, patients will be seen at 6 weeks and 6 months for symptom check  No heavy lifting for   at least 2-3 weeks after surgery, however patients can walk and do light activities the first day after surgery. Return to work time depends on occupation.    What are the advantages of HoLEP?  HoLEP has been studied in many different parts of the world and has been shown to be a safe and effective procedure. Although there are many types of BPH surgeries available, HoLEP offers a  unique advantage in being able to remove a large amount of tissue without any incisions on the body, even in very large prostates, while decreasing the risk of bleeding and providing tissue for pathology (to look for cancer). This decreases the need for blood transfusions during surgery, minimizes hospital stay, and reduces the risk of needing repeat treatment.  What are the side effects of HoLEP?  Temporary burning and bleeding during urination. Some blood may be seen in the urine for weeks after surgery and is part of the healing process.  Urinary incontinence (inability to control urine flow) is expected in all patients immediately after surgery and they should wear pads for the first few days/weeks. This typically improves over the course of several weeks. Performing Kegel exercises can help decrease leakage from stress maneuvers such as coughing, sneezing, or lifting. The rate of long term leakage is very low. Patients may also have leakage with urgency and this may be treated with medication. The risk of urge incontinence can be dependent on several factors including age, prostate size, symptoms, and other medical problems.  Retrograde ejaculation or "backwards ejaculation." In 75% of cases, the patient will not see any fluid during ejaculation after surgery.  Erectile function is generally not significantly affected.   What are the risks of HoLEP?  Injury to the urethra or development of scar tissue at a later date  Injury to the capsule of the prostate (typically treated with longer catheterization).  Injury to the bladder or ureteral orifices (where the urine from the kidney drains out)  Infection of the bladder, testes, or kidneys  Return of urinary obstruction at a later date requiring another operation (<2%)  Need for blood transfusion or re-operation due to bleeding  Failure to relieve all symptoms and/or need for prolonged catheterization after surgery  5-15% of patients are  found to have previously undiagnosed prostate cancer in their specimen. Prostate cancer can be treated after HoLEP.  Standard risks of anesthesia including blood clots, heart attacks, etc  When should I call my doctor?  Fever over 101.3 degrees  Inability to urinate, or large blood clots in the urine  AMBULATORY SURGERY  DISCHARGE INSTRUCTIONS   The drugs that you were given will stay in your system until tomorrow so for the next 24 hours you should not:  Drive an automobile Make any legal decisions Drink any alcoholic beverage   You may resume regular meals tomorrow.  Today it is better to start with liquids and gradually work up to solid foods.  You may eat anything you prefer, but it is better to start with liquids, then soup and crackers, and gradually work up to solid foods.   Please notify your doctor immediately if you have any unusual bleeding, trouble breathing, redness and pain at the surgery site, drainage, fever, or pain not relieved by medication.    Additional Instructions:        Please contact your physician with any problems or Same Day Surgery at [phone], Monday through Friday 6 am to 4 pm, or Portal at [place] Main number   at [phone].  

## 2021-09-21 NOTE — Anesthesia Preprocedure Evaluation (Addendum)
Anesthesia Evaluation  Patient identified by MRN, date of birth, ID band Patient awake    Reviewed: Allergy & Precautions, NPO status , Patient's Chart, lab work & pertinent test results  History of Anesthesia Complications Negative for: history of anesthetic complications  Airway Mallampati: II  TM Distance: >3 FB Neck ROM: Full    Dental no notable dental hx. (+) Teeth Intact, Chipped, Caps   Pulmonary neg sleep apnea, COPD, Current Smoker and Patient abstained from smoking.,    Pulmonary exam normal breath sounds clear to auscultation       Cardiovascular Exercise Tolerance: Good METS(-) hypertension(-) CAD and (-) Past MI negative cardio ROS  (-) dysrhythmias  Rhythm:Regular Rate:Normal - Systolic murmurs    Neuro/Psych PSYCHIATRIC DISORDERS Anxiety negative neurological ROS     GI/Hepatic GERD  Medicated and Controlled,(+)     (-) substance abuse  ,   Endo/Other  neg diabetes  Renal/GU negative Renal ROS     Musculoskeletal   Abdominal   Peds  Hematology   Anesthesia Other Findings Past Medical History: No date: GERD (gastroesophageal reflux disease) No date: Panic attacks No date: Pneumonia     Comment:  2018 No date: Polycythemia  Reproductive/Obstetrics                            Anesthesia Physical Anesthesia Plan  ASA: 3  Anesthesia Plan: General   Post-op Pain Management:    Induction: Intravenous  PONV Risk Score and Plan: 3 and Ondansetron and Dexamethasone  Airway Management Planned: Oral ETT  Additional Equipment: None  Intra-op Plan:   Post-operative Plan: Extubation in OR  Informed Consent: I have reviewed the patients History and Physical, chart, labs and discussed the procedure including the risks, benefits and alternatives for the proposed anesthesia with the patient or authorized representative who has indicated his/her understanding and  acceptance.     Dental advisory given  Plan Discussed with: CRNA and Surgeon  Anesthesia Plan Comments: (Discussed risks of anesthesia with patient, including PONV, sore throat, lip/dental/eye damage. Rare risks discussed as well, such as cardiorespiratory and neurological sequelae, and allergic reactions. Discussed the role of CRNA in patient's perioperative care. Patient understands.)        Anesthesia Quick Evaluation

## 2021-09-21 NOTE — Anesthesia Procedure Notes (Signed)
Procedure Name: Intubation Date/Time: 09/21/2021 11:52 AM Performed by: Cammie Sickle, CRNA Pre-anesthesia Checklist: Patient identified, Emergency Drugs available, Suction available and Patient being monitored Patient Re-evaluated:Patient Re-evaluated prior to induction Oxygen Delivery Method: Circle system utilized Preoxygenation: Pre-oxygenation with 100% oxygen Induction Type: IV induction Ventilation: Mask ventilation without difficulty and Two handed mask ventilation required Laryngoscope Size: McGraph and 3 Grade View: Grade I Tube type: Oral Tube size: 7.5 mm Number of attempts: 1 Airway Equipment and Method: Stylet and Oral airway Placement Confirmation: ETT inserted through vocal cords under direct vision, positive ETCO2 and breath sounds checked- equal and bilateral Secured at: 23 cm Tube secured with: Tape Dental Injury: Teeth and Oropharynx as per pre-operative assessment

## 2021-09-21 NOTE — Op Note (Signed)
Date of procedure: 09/21/21  Preoperative diagnosis:  BPH with BOO  Postoperative diagnosis:  same   Procedure: HoLEP with morcellation  Surgeon: Hollice Espy, MD  Anesthesia: General  Complications: None  Intraoperative findings: Intravesical protrusion with elevated bladder neck, circumferential intravesical prostate rather than true median lobe.  Enucleated in 2 lobes.  Moderately trabeculated bladder with diverticuli appreciated.  EBL: Minimal  Specimens: Prostate chips  Drains: 64 French two-way Foley catheter with 50 cc in the balloon  Indication: Harold Carroll is a 56 y.o. patient with obstructive urinary symptoms despite pharmacotherapy electing to undergo an outlet procedure in the form of HoLEP.  After reviewing the management options for treatment, he elected to proceed with the above surgical procedure(s). We have discussed the potential benefits and risks of the procedure, side effects of the proposed treatment, the likelihood of the patient achieving the goals of the procedure, and any potential problems that might occur during the procedure or recuperation. Informed consent has been obtained.  Description of procedure:  The patient was taken to the operating room and general anesthesia was induced.  The patient was placed in the dorsal lithotomy position, prepped and draped in the usual sterile fashion, and preoperative antibiotics were administered. A preoperative time-out was performed.     A 26 French resectoscope sheath using a blunt angled obturator was introduced without difficulty into the bladder.  The bladder was carefully inspected and noted to be moderately trabeculated with several diverticula. there is an elevated bladder neck circumferentially including an anterior lobe but without a discrete circumscribed median lobe.  The trigone was able to be visualized with some manipulation and the UOs were a good distance from bladder neck itself.  The  prostatic fossa had significant trilobar coaptation with greater than 5 cm prostatic length.  A 550 m laser fiber was then brought in and using settings of 0.9 J's and 53 Hz, 2 incisions were created at the 6:00 position of the bladder neck down to the level of the bladder neck/capsular fibers.  The incision was carried down caudally meeting in the midline just above the verumontanum.     Next, a semilunar incision was created at the prostatic apex on the left side again freeing up the adenoma from the underlying capsule.  Care was taken to avoid any resection past the verumontanum.  This incision was carried around laterally and cranially towards the bladder neck.  Ultimately, I was able to complete the anterior commissure mucosa and the adenoma into the bladder creating a widely patent prostatic fossa.     Next, the same similar incision was created at the right prostatic apex.  This adenoma however ended up being enucleated and more of a piece wise fashion freeing up a large BPH nodules from the capsular fibers.  Once this was completed and cleared from the bladder neck, the prostatic fossa was noted to be widely patent.  Hemostasis was achieved using hemostatic fiber settings.  Bilateral UOs were visualized and free of any injury.  Finally, the 24 French resectoscope was exchanged for nephroscope and using the Piranha handpiece morcellator, the bladder was distended in each of the prostate chips were evacuated.  The bladder was irrigated several times and smaller chips were clear for the bladder.  This point time, there were no residual fibers appreciated in the bladder.  Hemostasis was adequate.  10 mg of IV Lasix was administered to help with postoperative diuresis.  A 20 French two-way Foley catheter was then inserted  over a catheter guide with 50 cc in the balloon.  The catheter irrigated easily and well.  Patient was then clean and dry, repositioned supine position, reversed from anesthesia, taken to  PACU in stable condition.   Plan: Patient will return to the office in 2 for voiding trial.  IPSS/PVR with me in 6 weeks.    Hollice Espy, M.D.

## 2021-09-22 ENCOUNTER — Other Ambulatory Visit: Payer: Self-pay | Admitting: Internal Medicine

## 2021-09-22 ENCOUNTER — Encounter: Payer: Self-pay | Admitting: Urology

## 2021-09-22 ENCOUNTER — Encounter: Payer: Self-pay | Admitting: Internal Medicine

## 2021-09-22 DIAGNOSIS — E782 Mixed hyperlipidemia: Secondary | ICD-10-CM

## 2021-09-22 DIAGNOSIS — F410 Panic disorder [episodic paroxysmal anxiety] without agoraphobia: Secondary | ICD-10-CM

## 2021-09-22 LAB — SURGICAL PATHOLOGY

## 2021-09-22 MED ORDER — LORAZEPAM 0.5 MG PO TABS: 0.5000 mg | ORAL_TABLET | Freq: Every day | ORAL | 0 refills | Status: AC | PRN

## 2021-09-22 MED ORDER — SIMVASTATIN 10 MG PO TABS: 10.0000 mg | ORAL_TABLET | Freq: Every day | ORAL | 3 refills | Status: AC

## 2021-09-22 NOTE — Interval H&P Note (Signed)
History and Physical Interval Note:  09/22/2021 10:41 AM  Harold Carroll  has presented today for surgery, with the diagnosis of Benign Prostatic Hyperplasia with Urinary Obstruction.  The various methods of treatment have been discussed with the patient and family. After consideration of risks, benefits and other options for treatment, the patient has consented to  Procedure(s): [place] WITH MORCELLATION (N/A) as a surgical intervention.  The patient's history has been reviewed, patient examined, no change in status, stable for surgery.  I have reviewed the patient's chart and labs.  Questions were answered to the patient's satisfaction.    RRR CTAB  Hollice Espy

## 2021-09-22 NOTE — Progress Notes (Signed)
09/23/2021 8:37 AM   Harold Carroll 1965/10/08 [id number]  Referring provider: Glean Hess, MD [address] [place],  [place] [address]  Chief Complaint  Patient presents with   Follow-up   Urological history: 1. BPH with LU TS -PSA 0.8 08/2021 -s/p HoLEP 09/21/2021 - prostate chips negative for malignancy  2. ED -contributing factors of age, HLD, BPH and smoking -managed with sildenafil 20 mg, on-demand-dosing  3. Family history of prostate cancer -brother with prostate cancer diagnosed at the age of 60 treated with seed implants  4. Family history of bladder cancer -2 uncles with bladder cancer  5. High risk hematuria -smoker -CTU 09/2020 colovesical fistula  -cysto 10/2020 prostamegaly, moderate trabeculation with diverticula and inflammation on lateral walls  6. 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 male who presents today for voiding trial after undergoing HoLEP on 09/21/2021 with Dr. Erlene Quan with his friend, Lenna Sciara.   His post-procedural course was as expected and uneventful.    Catheter Removal  Patient is present today for a catheter removal.  50 ml of water was drained from the balloon. A 20 FR foley cath was removed from the bladder no complications were noted . Patient tolerated well.  PMH: Past Medical History:  Diagnosis Date   GERD (gastroesophageal reflux disease)    Panic attacks    Pneumonia    2018   Polycythemia     Surgical History: Past Surgical History:  Procedure Laterality Date   COLONOSCOPY WITH PROPOFOL N/A 06/18/2019   Procedure: COLONOSCOPY WITH BIOPSY;  Surgeon: Lucilla Lame, MD;  Location: [facility];  Service: Endoscopy;  Laterality: N/A;   HERNIA REPAIR     HOLEP-LASER ENUCLEATION OF THE PROSTATE WITH MORCELLATION N/A 09/21/2021   Procedure: HOLEP-LASER ENUCLEATION OF THE PROSTATE WITH MORCELLATION;  Surgeon: Hollice Espy, MD;  Location: [facility];   Service: Urology;  Laterality: N/A;   INGUINAL HERNIA REPAIR Left 10/28/2015   Procedure: HERNIA REPAIR INGUINAL ADULT;  Surgeon: Hubbard Robinson, MD;  Location: [facility];  Service: General;  Laterality: Left;   POLYPECTOMY  06/18/2019   Procedure: POLYPECTOMY;  Surgeon: Lucilla Lame, MD;  Location: [facility];  Service: Endoscopy;;   ROBOT ASSISTED LAPAROSCOPIC PARTIAL COLECTOMY  2022   SKIN SURGERY     Tumor-benign   TONSILLECTOMY AND ADENOIDECTOMY     TUMOR REMOVAL     VENTRAL HERNIA REPAIR  1968   Dr. Mare Ferrari    Home Medications:  Allergies as of 09/23/2021   No Known Allergies      Medication List        Accurate as of September 23, 2021  8:37 AM. If you have any questions, ask your nurse or doctor.          albuterol 108 (90 Base) MCG/ACT inhaler Commonly known as: VENTOLIN HFA Inhale 2 puffs into the lungs every 6 (six) hours as needed for wheezing or shortness of breath.   aspirin 81 MG EC tablet Take 81 mg by mouth daily.   cetirizine 10 MG tablet Commonly known as: ZYRTEC Take 10 mg by mouth daily.   fluticasone 50 MCG/ACT nasal spray Commonly known as: FLONASE Place 2 sprays into both nostrils daily. What changed:  when to take this reasons to take this   HYDROcodone-acetaminophen 5-325 MG tablet Commonly known as: NORCO/VICODIN Take 1-2 tablets by mouth every 6 (six) hours as needed for moderate pain.   LORazepam 0.5  MG tablet Commonly known as: ATIVAN Take 1 tablet (0.5 mg total) by mouth daily as needed for anxiety.   multivitamin capsule Take 1 capsule by mouth daily.   omeprazole 10 MG capsule Commonly known as: PRILOSEC Take 10 mg by mouth daily.   oxybutynin 5 MG tablet Commonly known as: DITROPAN Take 1 tablet (5 mg total) by mouth every 8 (eight) hours as needed for bladder spasms.   sildenafil 20 MG tablet Commonly known as: Revatio Take 1 tablet (20 mg total) by mouth as needed. Take 1-5 tabs as needed prior to  intercourse   simvastatin 10 MG tablet Commonly known as: ZOCOR Take 1 tablet (10 mg total) by mouth at bedtime.   tamsulosin 0.4 MG Caps capsule Commonly known as: FLOMAX Take 1 capsule (0.4 mg total) by mouth daily.        Allergies:  No Known Allergies  Family History: Family History  Problem Relation Age of Onset   Thyroid disease Mother    Heart disease Father        heart attack   Diabetes Father    Lung cancer Maternal Aunt    Cancer Maternal Aunt        lung cancer   Heart disease Paternal Uncle    Cancer Maternal Grandfather        lung cancer   Prostate cancer Brother 2   Lupus Brother    Prostate cancer Maternal Uncle     Social History:  reports that he has been smoking cigarettes. He started smoking about 37 years ago. He has a 45.00 pack-year smoking history. He has never used smokeless tobacco. He reports current alcohol use of about 9.0 standard drinks per week. He reports current drug use. Drug: Marijuana.  ROS: Pertinent ROS in HPI  Physical Exam: Constitutional:  Well nourished. Alert and oriented, No acute distress. HEENT: [place] AT, mask in place  Trachea midline Cardiovascular: No clubbing, cyanosis, or edema. Respiratory: Normal respiratory effort, no increased work of breathing. GU: No CVA tenderness.  No bladder fullness or masses.  Patient with circumcised phallus.  Foley in place.  Neurologic: Grossly intact, no focal deficits, moving all 4 extremities. Psychiatric: Normal mood and affect.   Laboratory Data: Lab Results  Component Value Date   WBC 12.3 (H) 05/15/2021   HGB 17.6 (H) 05/15/2021   HCT 52.3 (H) 05/15/2021   MCV 85.5 05/15/2021   PLT 293 05/15/2021    Lab Results  Component Value Date   CREATININE 1.16 07/13/2021       Component Value Date/Time   CHOL 231 (H) 07/13/2021 0834   HDL 46 07/13/2021 0834   CHOLHDL 5.0 07/13/2021 0834   LDLCALC 141 (H) 07/13/2021 0834    Lab Results  Component Value Date   AST 30  07/13/2021   Lab Results  Component Value Date   ALT 30 07/13/2021  I have reviewed the labs.   Pertinent Imaging: N/A  Assessment & Plan:    1. BPH with LU TS -s/p HoLEP 09/21/2021 -Foley removed today -return precautions reviewed  Return for Follow up as scheduled on 11/03/2021 with Dr. Erlene Quan .  These notes generated with voice recognition software. I apologize for typographical errors.  Zara Council, PA-C  [facility] [address]  [place],  [address] [phone]

## 2021-09-23 ENCOUNTER — Ambulatory Visit (INDEPENDENT_AMBULATORY_CARE_PROVIDER_SITE_OTHER): Payer: BC Managed Care – PPO | Admitting: Urology

## 2021-09-23 ENCOUNTER — Other Ambulatory Visit: Payer: Self-pay

## 2021-09-23 ENCOUNTER — Telehealth: Payer: Self-pay

## 2021-09-23 DIAGNOSIS — N401 Enlarged prostate with lower urinary tract symptoms: Secondary | ICD-10-CM

## 2021-09-23 DIAGNOSIS — N138 Other obstructive and reflux uropathy: Secondary | ICD-10-CM

## 2021-09-23 NOTE — Patient Instructions (Signed)

## 2021-09-23 NOTE — Telephone Encounter (Signed)
Patient called in today and states that he would like to know how long he is should remain out of work for his surgery.

## 2021-09-23 NOTE — Telephone Encounter (Signed)
It depends on what he does.  Now that the catheter is out, if he has ingestional he can go back right away.  If he is does heavy straining, he should probably stay out a week or two.  Hollice Espy, MD

## 2021-09-23 NOTE — Telephone Encounter (Signed)
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 Patient states that he will let his employer know.

## 2021-10-05 ENCOUNTER — Other Ambulatory Visit: Payer: Self-pay | Admitting: Internal Medicine

## 2021-10-05 DIAGNOSIS — H6691 Otitis media, unspecified, right ear: Secondary | ICD-10-CM

## 2021-10-05 DIAGNOSIS — J0100 Acute maxillary sinusitis, unspecified: Secondary | ICD-10-CM

## 2021-10-06 NOTE — Telephone Encounter (Signed)
Requested medication (s) are due for refill today:   No ? ?Requested medication (s) are on the active medication list:   No ? ?Future visit scheduled:   Yes ? ? ?Last ordered: Returned because this is from 05/16/2017,    Old rx and no protocol assigned to it.  ? ?Requested Prescriptions  ?Pending Prescriptions Disp Refills  ? amoxicillin-clavulanate (AUGMENTIN) 875-125 MG tablet [Pharmacy Med Name: Amoxicillin-Pot Clavulanate 875-125 MG Oral Tablet] 20 tablet 0  ?  Sig: Take 1 tablet by mouth twice daily  ?  ? Off-Protocol Failed - 10/05/2021  6:28 PM  ?  ?  Failed - Medication not assigned to a protocol, review manually.  ?  ?  Passed - Valid encounter within last 12 months  ?  Recent Outpatient Visits   ? ?      ? 2 months ago Annual physical exam  ? [facility] Glean Hess, MD  ? 1 year ago Gross hematuria  ? [facility] Glean Hess, MD  ? 1 year ago Gross hematuria  ? [facility] Juline Patch, MD  ? 1 year ago Annual physical exam  ? [facility] Glean Hess, MD  ? 1 year ago Urinary retention  ? [facility] Glean Hess, MD  ? ?  ?  ?Future Appointments   ? ?        ? In 4 weeks Hollice Espy, MD [place]  ? In 3 months [employer] [facility] Jesse Sans, MD [facility] [place]  ? In 9 months Glean Hess, MD [facility] [place]  ? ?  ? ?  ?  ?  ? ?

## 2021-10-13 ENCOUNTER — Other Ambulatory Visit: Payer: Self-pay | Admitting: Urology

## 2021-10-13 ENCOUNTER — Encounter: Payer: Self-pay | Admitting: Urology

## 2021-10-13 DIAGNOSIS — N138 Other obstructive and reflux uropathy: Secondary | ICD-10-CM

## 2021-10-13 NOTE — Progress Notes (Signed)
Order for PT

## 2021-11-02 NOTE — Progress Notes (Signed)
? ?11/03/21 ?10:21 AM  ? ?Harold Carroll ?[birth year] ?[id number] ? ?Referring provider:  ?Glean Hess, MD ?[address] ?[address] ?[place],  [place] [address] ?Chief Complaint  ?Patient presents with  ? Benign Prostatic Hypertrophy  ? ? ? ?HPI: ?Harold Carroll is a 56 y.o.male with a personal history of BPH with LUTS, ED, high risk hematuria, and colovesical fistula repaiR on 12/2020, who presents today for a  6 week follow-up with PVR and IPSS.  ? ?He was scoped in July 2022 which revealed intravesical portion of the prostate extending into the bladder with friable mucosa/hypervascularity with diverticula.  Bladder was noted to be trabeculated.  He was placed on finasteride at that time in addition to Flomax due to obstructive urinary symptoms, elected maximal medical therapy rather than surgical intervention for his BPH.  ? ?His most recent PSA was 0.8 on 08/28/2021.  ? ?He is s/p HoLEP on 09/21/2021. Intraoperative findings: Intravesical protrusion with elevated bladder neck, circumferential intravesical prostate rather than true median lobe.  Enucleated in 2 lobes.  Moderately trabeculated bladder with diverticuli appreciated. Surgical pathology was negative for malignancy.  ? ?He reports increased urinary leakage. He continues to have a pink tinges urine up to last week but this has subsided.  He continues to have urgency when he feels like he has to go to the bathroom and trouble getting there on time. ? ? ? IPSS   ? ? [place] Name 11/03/21 1000  ?  ?  ?  ? International Prostate Symptom Score  ? How often have you had the sensation of not emptying your bladder? Not at All    ? How often have you had to urinate less than every two hours? Not at All    ? How often have you found you stopped and started again several times when you urinated? Not at All    ? How often have you found it difficult to postpone urination? Almost always    ? How often have you had a weak urinary stream? Not at All    ? How  often have you had to strain to start urination? Not at All    ? How many times did you typically get up at night to urinate? 1 Time    ? Total IPSS Score 6    ?  ? Quality of Life due to urinary symptoms  ? If you were to spend the rest of your life with your urinary condition just the way it is now how would you feel about that? Unhappy    ? ?  ?  ? ?  ? ? ?Score:  ?1-7 Mild ?8-19 Moderate ?20-35 Severe ? ? ? ?PMH: ?Past Medical History:  ?Diagnosis Date  ? GERD (gastroesophageal reflux disease)   ? Panic attacks   ? Pneumonia   ? 2018  ? Polycythemia   ? ? ?Surgical History: ?Past Surgical History:  ?Procedure Laterality Date  ? COLONOSCOPY WITH PROPOFOL N/A 06/18/2019  ? Procedure: COLONOSCOPY WITH BIOPSY;  Surgeon: Lucilla Lame, MD;  Location: [facility];  Service: Endoscopy;  Laterality: N/A;  ? HERNIA REPAIR    ? HOLEP-LASER ENUCLEATION OF THE PROSTATE WITH MORCELLATION N/A 09/21/2021  ? Procedure: HOLEP-LASER ENUCLEATION OF THE PROSTATE WITH MORCELLATION;  Surgeon: Hollice Espy, MD;  Location: [facility];  Service: Urology;  Laterality: N/A;  ? INGUINAL HERNIA REPAIR Left 10/28/2015  ? Procedure: HERNIA REPAIR INGUINAL ADULT;  Surgeon: Hubbard Robinson, MD;  Location: [facility];  Service: General;  Laterality: Left;  ? POLYPECTOMY  06/18/2019  ? Procedure: POLYPECTOMY;  Surgeon: Lucilla Lame, MD;  Location: [facility];  Service: Endoscopy;;  ? [facility] PARTIAL COLECTOMY  2022  ? SKIN SURGERY    ? Tumor-benign  ? TONSILLECTOMY AND ADENOIDECTOMY    ? TUMOR REMOVAL    ? [place]  ? Dr. Mare Ferrari  ? ? ?Home Medications:  ?Allergies as of 11/03/2021   ?No Known Allergies ?  ? ?  ?Medication List  ?  ? ?  ? Accurate as of November 03, 2021 10:21 AM. If you have any questions, ask your nurse or doctor.  ?  ?  ? ?  ? ?albuterol 108 (90 Base) MCG/ACT inhaler ?Commonly known as: VENTOLIN HFA ?Inhale 2 puffs into the lungs every 6 (six) hours as needed for wheezing or  shortness of breath. ?  ?aspirin 81 MG EC tablet ?Take 81 mg by mouth daily. ?  ?cetirizine 10 MG tablet ?Commonly known as: ZYRTEC ?Take 10 mg by mouth daily. ?  ?fluticasone 50 MCG/ACT nasal spray ?Commonly known as: FLONASE ?Place 2 sprays into both nostrils daily. ?What changed:  ?when to take this ?reasons to take this ?  ?HYDROcodone-acetaminophen 5-325 MG tablet ?Commonly known as: NORCO/VICODIN ?Take 1-2 tablets by mouth every 6 (six) hours as needed for moderate pain. ?  ?LORazepam 0.5 MG tablet ?Commonly known as: ATIVAN ?Take 1 tablet (0.5 mg total) by mouth daily as needed for anxiety. ?  ?multivitamin capsule ?Take 1 capsule by mouth daily. ?  ?omeprazole 10 MG capsule ?Commonly known as: PRILOSEC ?Take 10 mg by mouth daily. ?  ?oxybutynin 5 MG tablet ?Commonly known as: DITROPAN ?Take 1 tablet (5 mg total) by mouth every 8 (eight) hours as needed for bladder spasms. ?  ?sildenafil 20 MG tablet ?Commonly known as: Revatio ?Take 1 tablet (20 mg total) by mouth as needed. Take 1-5 tabs as needed prior to intercourse ?  ?simvastatin 10 MG tablet ?Commonly known as: ZOCOR ?Take 1 tablet (10 mg total) by mouth at bedtime. ?  ?tamsulosin 0.4 MG Caps capsule ?Commonly known as: FLOMAX ?Take 1 capsule (0.4 mg total) by mouth daily. ?  ? ?  ? ? ?Allergies: No Known Allergies ? ?Family History: ?Family History  ?Problem Relation Age of Onset  ? Thyroid disease Mother   ? Heart disease Father   ?     heart attack  ? Diabetes Father   ? Lung cancer Maternal Aunt   ? Cancer Maternal Aunt   ?     lung cancer  ? Heart disease Paternal Uncle   ? Cancer Maternal Grandfather   ?     lung cancer  ? Prostate cancer Brother 80  ? Lupus Brother   ? Prostate cancer Maternal Uncle   ? ? ?Social History:  reports that he has been smoking cigarettes. He started smoking about 38 years ago. He has a 45.00 pack-year smoking history. He has never used smokeless tobacco. He reports current alcohol use of about 9.0 standard drinks per  week. He reports current drug use. Drug: Marijuana. ? ? ?Physical Exam: ?BP (!) 147/91   Pulse 98   Ht 6' (1.829 m)   Wt 228 lb (103.4 kg)   BMI 30.92 kg/m?   ?Constitutional:  Alert and oriented, No acute distress. ?HEENT: [place] AT, moist mucus membranes.  Trachea midline, no masses. ?Cardiovascular: No clubbing, cyanosis, or edema. ?Respiratory:  Normal respiratory effort, no increased work of breathing. ?Skin: No rashes, bruises or suspicious lesions. ?Neurologic: Grossly intact, no focal deficits, moving all 4 extremities. ?Psychiatric: Normal mood and affect. ? ?Laboratory Data: ?Lab Results  ?Component Value Date  ? CREATININE 1.16 07/13/2021  ? ?Lab Results  ?Component Value Date  ? HGBA1C 5.6 09/29/2015  ? ? ?Pertinent Imaging: ?Results for orders placed or performed in visit on 11/03/21  ?BLADDER SCAN AMB NON-IMAGING  ?Result Value Ref Range  ? Scan Result 2 ml   ? ? ? ?Assessment & Plan:   ?BPH with LUTS ?- S/p HoLEP on 09/22/2019; negative for malignancy  ?- He is clear to stop taking Flomax  ?- He emptying adequately with PVR of 2 ml today ? ?2. Urinary urgency/ frequency  ?- Discussed the common post-operative course after HoLEP  ?- he was offered OAB medication to help with symptoms he is interested in this at this time ?- Oxybutynin; prescribed for temporary use, urgency should improve with time ?-Begins PT tomorrow for pelvic floor strengthening, should have excellent results with this, he was reassured ? ?F/u 6 month IPSS/ PVR ? ?I,Harold Carroll,acting as a Education administrator for Hollice Espy, MD.,have documented all relevant documentation on the behalf of Hollice Espy, MD,as directed by  Hollice Espy, MD while in the presence of Hollice Espy, MD. ? ?I have reviewed the above documentation for accuracy and completeness, and I agree with the above.  ? ?Hollice Espy, MD ? ? ?[facility] ?[address], [address] ?[place],  [address] ?[phone] ? ?

## 2021-11-03 ENCOUNTER — Ambulatory Visit (INDEPENDENT_AMBULATORY_CARE_PROVIDER_SITE_OTHER): Payer: BC Managed Care – PPO | Admitting: Urology

## 2021-11-03 VITALS — BP 147/91 | HR 98 | Ht 72.0 in | Wt 228.0 lb

## 2021-11-03 DIAGNOSIS — N138 Other obstructive and reflux uropathy: Secondary | ICD-10-CM

## 2021-11-03 DIAGNOSIS — N401 Enlarged prostate with lower urinary tract symptoms: Secondary | ICD-10-CM | POA: Diagnosis not present

## 2021-11-03 LAB — BLADDER SCAN AMB NON-IMAGING: Scan Result: 2

## 2021-11-04 ENCOUNTER — Ambulatory Visit (INDEPENDENT_AMBULATORY_CARE_PROVIDER_SITE_OTHER): Payer: BC Managed Care – PPO | Admitting: Urology

## 2021-11-04 ENCOUNTER — Telehealth: Payer: Self-pay | Admitting: Urology

## 2021-11-04 ENCOUNTER — Ambulatory Visit: Payer: BC Managed Care – PPO | Admitting: Physical Therapy

## 2021-11-04 VITALS — BP 117/88 | HR 105 | Temp 97.5°F | Ht 72.0 in | Wt 228.0 lb

## 2021-11-04 DIAGNOSIS — N390 Urinary tract infection, site not specified: Secondary | ICD-10-CM

## 2021-11-04 DIAGNOSIS — N492 Inflammatory disorders of scrotum: Secondary | ICD-10-CM

## 2021-11-04 MED ORDER — CEFTRIAXONE SODIUM 1 G IJ SOLR
1.0000 g | Freq: Once | INTRAMUSCULAR | Status: AC
  Administered 2021-11-04: 1 g via INTRAMUSCULAR

## 2021-11-04 NOTE — Progress Notes (Signed)
11/05/21 ?1:39 PM  ? ?[name] ?[birth year] ?[id number] ? ?Referring provider:  ?Glean Hess, MD ?[address] ?[address] ?[place],  [place] [address] ? ?Chief Complaint  ?Patient presents with  ? Other  ? ? ?Urological history ?BPH with LUTS  ?-PSA 0.8 in 08/28/2021 ?-cysto 2023 - Intravesical protrusion with elevated bladder neck, circumferential intravesical prostate rather than true median lobe.  Enucleated in 2 lobes.  Moderately trabeculated bladder with diverticuli appreciated. ?- s/p HoLEP on 09/21/2021 - prostate chips negative for malignancy  ? ?2. Urinary frequency/ urgency  ?- Managed of oxybutynin temporarily  ?- Undergoing PT  ? ?3. Penile scrotal cellulitis  ?- Exam yesterday with Dr Erlene Quan showed Mild scrotal erythema and penile erythema no evidence of apparent phimosis. Erythema extended to suprapubic area his perineum is intact   ?-CT 11/05/2021 - Diffuse inflammation and edema involving the penis but with relative sparing of the penile crura and pelvic floor, confined more to the shaft of the penis and the prepuce. Also associated with presumed cellulitis tracking into the scrotum and the mons pubis, suspicious for diffuse sequela of infection and not associated with soft tissue gas at the current time. Imaging does not extend entirely through the scrotum but edema is less pronounced in the scrotum than in the ?penis. ?-IM Rocephin 1 gram yesterday ?-script for Bactrim x7 days  ? ?HPI: ?Harold Carroll is a 56 y.o.male who presents today for penile scrotal cellulitis recheck.  ? ?He was seen yesterday in clinic by Dr Erlene Quan rash started after placing Desitin on his penis on 11/03/2021.  ? ?CT abdomen and pelvis today visualized diffuse inflammation and edema involving the penis but with relative sparing of the penile crura and pelvic floor, confined more to the shaft of the penis and the prepuce. Also associated with presumed ?cellulitis tracking into the scrotum and the mons  pubis, suspicious for diffuse sequela of infection and not associated with soft tissue gas at the current time. Imaging does not extend entirely through the scrotum but edema is less pronounced in the scrotum than in the penis. ? ?He denies any fevers.  ? ?Dr. Versie Starks examined him today as well. ? ?UA 6-10 WBC's  ? ?PMH: ?Past Medical History:  ?Diagnosis Date  ? GERD (gastroesophageal reflux disease)   ? Panic attacks   ? Pneumonia   ? 2018  ? Polycythemia   ? ? ?Surgical History: ?Past Surgical History:  ?Procedure Laterality Date  ? COLONOSCOPY WITH PROPOFOL N/A 06/18/2019  ? Procedure: COLONOSCOPY WITH BIOPSY;  Surgeon: Lucilla Lame, MD;  Location: [facility];  Service: Endoscopy;  Laterality: N/A;  ? HERNIA REPAIR    ? HOLEP-LASER ENUCLEATION OF THE PROSTATE WITH MORCELLATION N/A 09/21/2021  ? Procedure: HOLEP-LASER ENUCLEATION OF THE PROSTATE WITH MORCELLATION;  Surgeon: Hollice Espy, MD;  Location: [facility];  Service: Urology;  Laterality: N/A;  ? INGUINAL HERNIA REPAIR Left 10/28/2015  ? Procedure: HERNIA REPAIR INGUINAL ADULT;  Surgeon: Hubbard Robinson, MD;  Location: [facility];  Service: General;  Laterality: Left;  ? POLYPECTOMY  06/18/2019  ? Procedure: POLYPECTOMY;  Surgeon: Lucilla Lame, MD;  Location: [facility];  Service: Endoscopy;;  ? [place] PARTIAL COLECTOMY  2022  ? SKIN SURGERY    ? Tumor-benign  ? TONSILLECTOMY AND ADENOIDECTOMY    ? TUMOR REMOVAL    ? [place]  ? Dr. Mare Ferrari  ? ? ?Home Medications:  ?Allergies as of 11/05/2021   ?  No Known Allergies ?  ? ?  ?Medication List  ?  ? ?  ? Accurate as of November 05, 2021  1:39 PM. If you have any questions, ask your nurse or doctor.  ?  ?  ? ?  ? ?albuterol 108 (90 Base) MCG/ACT inhaler ?Commonly known as: VENTOLIN HFA ?Inhale 2 puffs into the lungs every 6 (six) hours as needed for wheezing or shortness of breath. ?  ?aspirin 81 MG EC tablet ?Take 81 mg by mouth daily. ?  ?cetirizine 10 MG  tablet ?Commonly known as: ZYRTEC ?Take 10 mg by mouth daily. ?  ?fluticasone 50 MCG/ACT nasal spray ?Commonly known as: FLONASE ?Place 2 sprays into both nostrils daily. ?What changed:  ?when to take this ?reasons to take this ?  ?HYDROcodone-acetaminophen 5-325 MG tablet ?Commonly known as: NORCO/VICODIN ?Take 1-2 tablets by mouth every 6 (six) hours as needed for moderate pain. ?  ?LORazepam 0.5 MG tablet ?Commonly known as: ATIVAN ?Take 1 tablet (0.5 mg total) by mouth daily as needed for anxiety. ?  ?multivitamin capsule ?Take 1 capsule by mouth daily. ?  ?omeprazole 10 MG capsule ?Commonly known as: PRILOSEC ?Take 10 mg by mouth daily. ?  ?oxybutynin 5 MG tablet ?Commonly known as: DITROPAN ?Take 1 tablet (5 mg total) by mouth every 8 (eight) hours as needed for bladder spasms. ?  ?sildenafil 20 MG tablet ?Commonly known as: Revatio ?Take 1 tablet (20 mg total) by mouth as needed. Take 1-5 tabs as needed prior to intercourse ?  ?simvastatin 10 MG tablet ?Commonly known as: ZOCOR ?Take 1 tablet (10 mg total) by mouth at bedtime. ?  ?sulfamethoxazole-trimethoprim 800-160 MG tablet ?Commonly known as: BACTRIM DS ?Take 1 tablet by mouth every 12 (twelve) hours. ?Started by: Zara Council, PA-C ?  ? ?  ? ? ?Allergies:  ?No Known Allergies ? ?Family History: ?Family History  ?Problem Relation Age of Onset  ? Thyroid disease Mother   ? Heart disease Father   ?     heart attack  ? Diabetes Father   ? Lung cancer Maternal Aunt   ? Cancer Maternal Aunt   ?     lung cancer  ? Heart disease Paternal Uncle   ? Cancer Maternal Grandfather   ?     lung cancer  ? Prostate cancer Brother 32  ? Lupus Brother   ? Prostate cancer Maternal Uncle   ? ? ?Social History:  reports that he has been smoking cigarettes. He started smoking about 38 years ago. He has a 45.00 pack-year smoking history. He has never used smokeless tobacco. He reports current alcohol use of about 9.0 standard drinks per week. He reports current drug use.  Drug: Marijuana. ? ? ?Physical Exam: ?BP (!) 123/92   Pulse 91   Ht 6' (1.829 m)   Wt 228 lb (103.4 kg)   BMI 30.92 kg/m?   ?Constitutional:  Alert and oriented, No acute distress. ?HEENT: [place] AT, moist mucus membranes.  Trachea midline, no masses. ?Cardiovascular: No clubbing, cyanosis, or edema. ?Respiratory: Normal respiratory effort, no increased work of breathing. ?GU:  Moderate scrotal erythema and penile erythema. Erythema is still maintained in marked borders but it has increased in severity and pain ?Neurologic: Grossly intact, no focal deficits, moving all 4 extremities. ?Psychiatric: Normal mood and affect. ? ? ?Laboratory Data: ?Urinalysis ?Component ?    Latest Ref Rng 11/05/2021  ?Color, UA ?    Yellow  Yellow   ?Bilirubin, UA ?  Negative  Negative   ?Ketones, UA ?    Negative  Negative   ?Specific Gravity, UA ?    1.005 - 1.030  <1.005 (L)   ?RBC, UA ?    Negative  Trace !   ?pH, UA ?    5.0 - 7.5  6.0   ?Protein,UA ?    Negative/Trace  Negative   ?Nitrite, UA ?    Negative  Negative   ?Leukocytes,UA ?    Negative  Trace !   ?Glucose, UA ?    Negative  Negative   ?Appearance Ur ?    Clear  Clear   ?Urobilinogen, Ur ?    0.2 - 1.0 mg/dL 0.2   ?Microscopic Examination See below:   ? ?Component ?    Latest Ref Rng 11/05/2021  ?RBC ?    0 - 2 /hpf 0-2   ?WBC, UA ?    0 - 5 /hpf 6-10 !   ?Bacteria, UA ?    None seen/Few  None seen   ?Epithelial Cells (non renal) ?    0 - 10 /hpf 0-10   ?I have reviewed the labs.  ? ? ? ?Pertinent Imaging: ?CLINICAL DATA:  w 56 year old male presents with penile swelling and ?pain. ?  ?EXAM: ?CT ABDOMEN AND PELVIS WITH CONTRAST ?  ?TECHNIQUE: ?Multidetector CT imaging of the abdomen and pelvis was performed ?using the standard protocol following bolus administration of ?intravenous contrast. ?  ?RADIATION DOSE REDUCTION: This exam was performed according to the ?departmental dose-optimization program which includes automated ?exposure control, adjustment of the mA and/or  kV according to ?patient size and/or use of iterative reconstruction technique. ?  ?CONTRAST:  14m OMNIPAQUE IOHEXOL 300 MG/ML  SOLN ?  ?COMPARISON:  Comparison is made with Dec 09, 2020. ?  ?FINDINGS: ?Lower c

## 2021-11-04 NOTE — Telephone Encounter (Signed)
Patient called back he accidentally took 62.5 mg of benadryl. He states he took 89m of childrens benadryl which is 12.'5mg'$ / 5 ml. Advised pt he should be OK, just very tired. Advised pt to not operate any machinery or drive. Advised pt if symptoms change to let u[place]. Pt verbalized understanding. ?

## 2021-11-04 NOTE — Telephone Encounter (Signed)
Called Harold Carroll back. He states he put desitin on his penis yesterday due to a mild rash. Harold Carroll states after that he mowed the lawn and since has had a lot of pain and swelling around the skin at the head of the penis. He states it is so painful it hurts to walk. Patient denies any fever, chills, difficulty urinating, dysuria, discharge or any other symptoms at this time. As per Dr. Erlene Quan advised Harold Carroll to take 25 mg of Benadryl and call us back at 2pm. Harold Carroll verbalized understanding and will contact us again.  ?

## 2021-11-04 NOTE — Progress Notes (Signed)
? ?11/05/21 ?8:14 AM  ? ?Harold Carroll ?04/21/1966 ?[id number] ? ?Referring provider:  ?Glean Hess, MD ?[address] ?[address] ?[place],  [place] [address] ?No chief complaint on file. ? ? ? ?HPI: ?Harold Carroll is a 56 y.o.male with a personal history of BPH with LUTS, ED, high risk hematuria, and colovesical fistula repair on 12/2020, who presents today for penile swelling and rash.  ? ?He was scoped in July 2022 which revealed intravesical portion of the prostate extending into the bladder with friable mucosa/hypervascularity with diverticula.  Bladder was noted to be trabeculated.  He was placed on finasteride at that time in addition to Flomax due to obstructive urinary symptoms, elected maximal medical therapy rather than surgical intervention for his BPH.  ?  ?He is s/p HoLEP on 09/21/2021. Intraoperative findings: Intravesical protrusion with elevated bladder neck, circumferential intravesical prostate rather than true median lobe.  Enucleated in 2 lobes.  Moderately trabeculated bladder with diverticuli appreciated. Surgical pathology was negative for malignancy.  ?  ?Patient called clinic today with concern of rash on penis he put Desitin on his penis yesterday before moving the lawn. He had swelling and pain around the skin at the head of the penis. He reports the pain is so painful he is having trouble walking. He took 62.5 mg of benadryl at around 10 am today without improvement.  No fevers.  ? ? ?PMH: ?Past Medical History:  ?Diagnosis Date  ? GERD (gastroesophageal reflux disease)   ? Panic attacks   ? Pneumonia   ? 2018  ? Polycythemia   ? ? ?Surgical History: ?Past Surgical History:  ?Procedure Laterality Date  ? COLONOSCOPY WITH PROPOFOL N/A 06/18/2019  ? Procedure: COLONOSCOPY WITH BIOPSY;  Surgeon: Lucilla Lame, MD;  Location: [facility];  Service: Endoscopy;  Laterality: N/A;  ? HERNIA REPAIR    ? HOLEP-LASER ENUCLEATION OF THE PROSTATE WITH MORCELLATION N/A 09/21/2021   ? Procedure: HOLEP-LASER ENUCLEATION OF THE PROSTATE WITH MORCELLATION;  Surgeon: Hollice Espy, MD;  Location: [facility];  Service: Urology;  Laterality: N/A;  ? INGUINAL HERNIA REPAIR Left 10/28/2015  ? Procedure: HERNIA REPAIR INGUINAL ADULT;  Surgeon: Hubbard Robinson, MD;  Location: [facility];  Service: General;  Laterality: Left;  ? POLYPECTOMY  06/18/2019  ? Procedure: POLYPECTOMY;  Surgeon: Lucilla Lame, MD;  Location: [facility];  Service: Endoscopy;;  ? [place] PARTIAL COLECTOMY  2022  ? SKIN SURGERY    ? Tumor-benign  ? TONSILLECTOMY AND ADENOIDECTOMY    ? TUMOR REMOVAL    ? [place]  ? Dr. Mare Ferrari  ? ? ?Home Medications:  ?Allergies as of 11/04/2021   ?No Known Allergies ?  ? ?  ?Medication List  ?  ? ?  ? Accurate as of November 04, 2021 11:59 PM. If you have any questions, ask your nurse or doctor.  ?  ?  ? ?  ? ?albuterol 108 (90 Base) MCG/ACT inhaler ?Commonly known as: VENTOLIN HFA ?Inhale 2 puffs into the lungs every 6 (six) hours as needed for wheezing or shortness of breath. ?  ?aspirin 81 MG EC tablet ?Take 81 mg by mouth daily. ?  ?cetirizine 10 MG tablet ?Commonly known as: ZYRTEC ?Take 10 mg by mouth daily. ?  ?fluticasone 50 MCG/ACT nasal spray ?Commonly known as: FLONASE ?Place 2 sprays into both nostrils daily. ?What changed:  ?when to take this ?reasons to take this ?  ?HYDROcodone-acetaminophen 5-325 MG tablet ?Commonly  known as: NORCO/VICODIN ?Take 1-2 tablets by mouth every 6 (six) hours as needed for moderate pain. ?  ?LORazepam 0.5 MG tablet ?Commonly known as: ATIVAN ?Take 1 tablet (0.5 mg total) by mouth daily as needed for anxiety. ?  ?multivitamin capsule ?Take 1 capsule by mouth daily. ?  ?omeprazole 10 MG capsule ?Commonly known as: PRILOSEC ?Take 10 mg by mouth daily. ?  ?oxybutynin 5 MG tablet ?Commonly known as: DITROPAN ?Take 1 tablet (5 mg total) by mouth every 8 (eight) hours as needed for bladder spasms. ?  ?sildenafil 20 MG  tablet ?Commonly known as: Revatio ?Take 1 tablet (20 mg total) by mouth as needed. Take 1-5 tabs as needed prior to intercourse ?  ?simvastatin 10 MG tablet ?Commonly known as: ZOCOR ?Take 1 tablet (10 mg total) by mouth at bedtime. ?  ? ?  ? ? ?Allergies:  ?No Known Allergies ? ?Family History: ?Family History  ?Problem Relation Age of Onset  ? Thyroid disease Mother   ? Heart disease Father   ?     heart attack  ? Diabetes Father   ? Lung cancer Maternal Aunt   ? Cancer Maternal Aunt   ?     lung cancer  ? Heart disease Paternal Uncle   ? Cancer Maternal Grandfather   ?     lung cancer  ? Prostate cancer Brother 16  ? Lupus Brother   ? Prostate cancer Maternal Uncle   ? ? ?Social History:  reports that he has been smoking cigarettes. He started smoking about 38 years ago. He has a 45.00 pack-year smoking history. He has never used smokeless tobacco. He reports current alcohol use of about 9.0 standard drinks per week. He reports current drug use. Drug: Marijuana. ? ? ?Physical Exam: ?BP 117/88   Pulse (!) 105   Temp (!) 97.5 ?F (36.4 ?C)   Ht 6' (1.829 m)   Wt 228 lb (103.4 kg)   BMI 30.92 kg/m?   ?Constitutional:  Alert and oriented, No acute distress. ?HEENT: [place] AT, moist mucus membranes.  Trachea midline, no masses. ?Cardiovascular: No clubbing, cyanosis, or edema. ?Respiratory: Normal respiratory effort, no increased work of breathing. ?GU: Mild scrotal erythema and penile erythema no evidence of apparent paraphimosis. Erythema extended to suprapubic area; his perineum is intact  Edges marked.   ?Skin: No rashes, bruises or suspicious lesions. ?Neurologic: Grossly intact, no focal deficits, moving all 4 extremities. ?Psychiatric: Normal mood and affect. ? ?Laboratory Data: ? ?Lab Results  ?Component Value Date  ? CREATININE 1.16 07/13/2021  ? ?Lab Results  ?Component Value Date  ? HGBA1C 5.6 09/29/2015  ? ? ?Assessment & Plan:   ? ?Penis scrotal cellulitis  ?- Exam most consistent with infectious process  although cannot r/o allergic reaction ?- At this time there is no concern for Fournier gangrene  ?- Would like to monitor him closely he will return tomorrow for recheck with Zara Council,  PA-C. We marked the areas of erythema today. If there is progression will get CT and consider admit for IV antibiotics  ?- He was given a dose of IM ceftriaxone today  ?-  He was prescribed Bactrim x7 days for MRSA coverage ? ? ?Return tomorrow for recheck with Zara Council, PA- C ? ?Conley Rolls as a scribe for Hollice Espy, MD.,have documented all relevant documentation on the behalf of Hollice Espy, MD,as directed by  Hollice Espy, MD while in the presence of Hollice Espy, MD. ? ?I have reviewed  the above documentation for accuracy and completeness, and I agree with the above.  ? ?Hollice Espy, MD ? ? ?[facility] ?[address], [address] ?[place], [place] [address] ?[phone] ?

## 2021-11-04 NOTE — Telephone Encounter (Signed)
Patient called and having a terrible reaction. Started yesterday afternoon, Swelling on his whole penis and scrotum. (0-10 @ 8 or 9. Had surgery on 09/21/21 been 6 weeks. Please advise patient. ?

## 2021-11-05 ENCOUNTER — Other Ambulatory Visit: Payer: Self-pay

## 2021-11-05 ENCOUNTER — Ambulatory Visit (INDEPENDENT_AMBULATORY_CARE_PROVIDER_SITE_OTHER): Payer: BC Managed Care – PPO | Admitting: Urology

## 2021-11-05 ENCOUNTER — Encounter: Payer: Self-pay | Admitting: Urology

## 2021-11-05 ENCOUNTER — Ambulatory Visit
Admission: RE | Admit: 2021-11-05 | Discharge: 2021-11-05 | Disposition: A | Discharge status: Home / Self Care | Payer: BC Managed Care – PPO | Source: Ambulatory Visit | Attending: Urology | Admitting: Urology

## 2021-11-05 VITALS — BP 123/92 | HR 91 | Ht 72.0 in | Wt 228.0 lb

## 2021-11-05 DIAGNOSIS — R300 Dysuria: Secondary | ICD-10-CM | POA: Insufficient documentation

## 2021-11-05 DIAGNOSIS — N492 Inflammatory disorders of scrotum: Secondary | ICD-10-CM

## 2021-11-05 DIAGNOSIS — N321 Vesicointestinal fistula: Secondary | ICD-10-CM | POA: Insufficient documentation

## 2021-11-05 LAB — URINALYSIS, COMPLETE
Bilirubin, UA: NEGATIVE
Glucose, UA: NEGATIVE
Ketones, UA: NEGATIVE
Nitrite, UA: NEGATIVE
Protein,UA: NEGATIVE
Specific Gravity, UA: <1.005 — ABNORMAL LOW (ref 1.005–1.030)
Urobilinogen, Ur: 0.2 mg/dL (ref 0.2–1.0)
pH, UA: 6 (ref 5.0–7.5)

## 2021-11-05 LAB — MICROSCOPIC EXAMINATION: Bacteria, UA: NONE SEEN

## 2021-11-05 MED ORDER — SULFAMETHOXAZOLE-TRIMETHOPRIM 800-160 MG PO TABS: 1.0000 | ORAL_TABLET | Freq: Two times a day (BID) | ORAL | 0 refills | Status: DC

## 2021-11-05 MED ORDER — IOHEXOL 300 MG/ML  SOLN
100.0000 mL | Freq: Once | INTRAMUSCULAR | Status: AC | PRN
  Administered 2021-11-05: 100 mL via INTRAVENOUS

## 2021-11-05 MED ORDER — SULFAMETHOXAZOLE-TRIMETHOPRIM 800-160 MG PO TABS
1.0000 | ORAL_TABLET | Freq: Once | ORAL | Status: AC
  Administered 2021-11-05: 1 via ORAL

## 2021-11-08 NOTE — Progress Notes (Signed)
11/15/21 ?9:29 PM  ? ?Harold Carroll ?11-13-1965 ?[id number] ? ?Referring provider:  ?Glean Hess, MD ?[address] ?[address] ?[place],  [place] [address] ? ?Chief Complaint  ?Patient presents with  ? Follow-up  ?  rash  ? ? ?Urological history: ? ?BPH with LUTS  ?-PSA 0.8 in 08/28/2021 ?-cysto 2023 - Intravesical protrusion with elevated bladder neck, circumferential intravesical prostate rather than true median lobe.  Enucleated in 2 lobes.  Moderately trabeculated bladder with diverticuli appreciated. ?- s/p HoLEP on 09/21/2021 - prostate chips negative for malignancy  ?  ?2. Urinary frequency/ urgency  ?- Managed of oxybutynin temporarily  ?- Undergoing PT  ?  ?3. Penile scrotal cellulitis  ?- Exam yesterday with Dr Erlene Quan showed Mild scrotal erythema and penile erythema no evidence of apparent phimosis. Erythema extended to suprapubic area his perineum is intact   ?-CT 11/05/2021 - Diffuse inflammation and edema involving the penis but with relative sparing of the penile crura and pelvic floor, confined more to the shaft of the penis and the prepuce. Also associated with presumed cellulitis tracking into the scrotum and the mons pubis, suspicious for diffuse sequela of infection and not associated with soft tissue gas at the current time. Imaging does not extend entirely through the scrotum but edema is less pronounced in the scrotum than in the ?penis. ?-IM Rocephin 1 gram 11/05/2021 ?-script for Bactrim x7 days  ?- GU exam on 11/05/2021 showed Moderate scrotal erythema and penile erythema. Erythema was still maintained in marked borders but it increased in severity and pain ?  ? ? ?HPI: ?Harold Carroll is a 56 y.o.male who presents today for recheck on penile scrotal cellulitis.  ? ?He reports that his scrotal swelling and pain has decreased, but now he has a rash that is on his trunk legs and arms.    ? ?Patient denies any modifying or aggravating factors.  Patient denies any gross hematuria,  dysuria or suprapubic/flank pain.  Patient denies any fevers, chills, nausea or vomiting.  ? ? ?PMH: ?Past Medical History:  ?Diagnosis Date  ? GERD (gastroesophageal reflux disease)   ? Panic attacks   ? Pneumonia   ? 2018  ? Polycythemia   ? ? ?Surgical History: ?Past Surgical History:  ?Procedure Laterality Date  ? COLONOSCOPY WITH PROPOFOL N/A 06/18/2019  ? Procedure: COLONOSCOPY WITH BIOPSY;  Surgeon: Lucilla Lame, MD;  Location: [facility];  Service: Endoscopy;  Laterality: N/A;  ? HERNIA REPAIR    ? HOLEP-LASER ENUCLEATION OF THE PROSTATE WITH MORCELLATION N/A 09/21/2021  ? Procedure: HOLEP-LASER ENUCLEATION OF THE PROSTATE WITH MORCELLATION;  Surgeon: Hollice Espy, MD;  Location: [facility];  Service: Urology;  Laterality: N/A;  ? INGUINAL HERNIA REPAIR Left 10/28/2015  ? Procedure: HERNIA REPAIR INGUINAL ADULT;  Surgeon: Hubbard Robinson, MD;  Location: [facility];  Service: General;  Laterality: Left;  ? POLYPECTOMY  06/18/2019  ? Procedure: POLYPECTOMY;  Surgeon: Lucilla Lame, MD;  Location: [facility];  Service: Endoscopy;;  ? [place] PARTIAL COLECTOMY  2022  ? SKIN SURGERY    ? Tumor-benign  ? TONSILLECTOMY AND ADENOIDECTOMY    ? TUMOR REMOVAL    ? [place]  ? Dr. Mare Ferrari  ? ? ?Home Medications:  ?Allergies as of 11/09/2021   ? ?   Reactions  ? Septra [sulfamethoxazole-trimethoprim] Rash  ? ?  ? ?  ?Medication List  ?  ? ?  ? Accurate as of November 09, 2021  11:59 PM. If you have any questions, ask your nurse or doctor.  ?  ?  ? ?  ? ?STOP taking these medications   ? ?HYDROcodone-acetaminophen 5-325 MG tablet ?Commonly known as: NORCO/VICODIN ?Stopped by: Zara Council, PA-C ?  ? ?  ? ?TAKE these medications   ? ?albuterol 108 (90 Base) MCG/ACT inhaler ?Commonly known as: VENTOLIN HFA ?Inhale 2 puffs into the lungs every 6 (six) hours as needed for wheezing or shortness of breath. ?  ?aspirin 81 MG EC tablet ?Take 81 mg by mouth daily. ?   ?cetirizine 10 MG tablet ?Commonly known as: ZYRTEC ?Take 10 mg by mouth daily. ?  ?clindamycin 300 MG capsule ?Commonly known as: CLEOCIN ?Take 1 capsule (300 mg total) by mouth 3 (three) times daily. ?Started by: Zara Council, PA-C ?  ?fluticasone 50 MCG/ACT nasal spray ?Commonly known as: FLONASE ?Place 2 sprays into both nostrils daily. ?  ?LORazepam 0.5 MG tablet ?Commonly known as: ATIVAN ?Take 1 tablet (0.5 mg total) by mouth daily as needed for anxiety. ?  ?multivitamin capsule ?Take 1 capsule by mouth daily. ?  ?omeprazole 10 MG capsule ?Commonly known as: PRILOSEC ?Take 10 mg by mouth daily. ?  ?oxybutynin 5 MG tablet ?Commonly known as: DITROPAN ?Take 1 tablet (5 mg total) by mouth every 8 (eight) hours as needed for bladder spasms. ?  ?sildenafil 20 MG tablet ?Commonly known as: Revatio ?Take 1 tablet (20 mg total) by mouth as needed. Take 1-5 tabs as needed prior to intercourse ?  ?simvastatin 10 MG tablet ?Commonly known as: ZOCOR ?Take 1 tablet (10 mg total) by mouth at bedtime. ?  ?sulfamethoxazole-trimethoprim 800-160 MG tablet ?Commonly known as: BACTRIM DS ?Take 1 tablet by mouth every 12 (twelve) hours. ?  ? ?  ? ? ?Allergies:  ?Allergies  ?Allergen Reactions  ? Septra [Sulfamethoxazole-Trimethoprim] Rash  ? ? ?Family History: ?Family History  ?Problem Relation Age of Onset  ? Thyroid disease Mother   ? Heart disease Father   ?     heart attack  ? Diabetes Father   ? Lung cancer Maternal Aunt   ? Cancer Maternal Aunt   ?     lung cancer  ? Heart disease Paternal Uncle   ? Cancer Maternal Grandfather   ?     lung cancer  ? Prostate cancer Brother 27  ? Lupus Brother   ? Prostate cancer Maternal Uncle   ? ? ?Social History:  reports that he has been smoking cigarettes. He started smoking about 38 years ago. He has a 45.00 pack-year smoking history. He has never used smokeless tobacco. He reports current alcohol use of about 9.0 standard drinks per week. He reports current drug use. Drug:  Marijuana. ? ? ?Physical Exam: ?BP 121/85   Pulse 90   Ht 6' (1.829 m)   Wt 226 lb (102.5 kg)   BMI 30.65 kg/m?   ?Constitutional:  Well nourished. Alert and oriented, No acute distress. ?HEENT: [place] AT, moist mucus membranes.  Trachea midline ?Cardiovascular: No clubbing, cyanosis, or edema. ?Respiratory: Normal respiratory effort, no increased work of breathing. ?GU: No CVA tenderness.  No bladder fullness or masses.  Penile edema has markedly decreased.  No penile discharge. No penile lesions or rashes.  Scrotal edema has markedly decreased, but still with erythema.  Scrotum Testicles are located scrotally bilaterally. No masses are appreciated in the testicles. Left and right epididymis are normal.  The marked edges are no longer present, but the erythema  and mild edema appear to be decreased in severity and an area. ?Skin: Maculopapular rash is now noted on the truncal area his arms and legs. ?Neurologic: Grossly intact, no focal deficits, moving all 4 extremities. ?Psychiatric: Normal mood and affect.  ? ?Laboratory Data: ?N/A ? ?  ?Assessment & Plan:   ? ?Penile scrotal cellulitis  ?- Swelling has improved  ?-We will have patient continue antibiotic, but we will have him discontinue the Septra and start clindamycin ? ?2. Allergic reaction  ?-The rash on the arms legs and trunk appears to be consistent with a allergic reaction to antibiotics  ?- Septra was switched to Clindamycin. We have listed allergy in chart  ? ?3. Urinary frequency/ urgency  ?- Will stop oxybutynin and abundance of caution in case his allergic reaction is due to the oxybutynin versus the Septra ?- He was given Gemtesa samples  ?  ?Return in about 1 week (around 11/16/2021) for Follow up with Dr. Erlene Quan . ? ?[facility] ?[address], [address] ?[place], [place] [address] ?[phone] ? ?I,Kailey Littlejohn,acting as a Education administrator for [employer], PA-C.,have documented all relevant documentation on the behalf  of [place], PA-C,as directed by  [facility], PA-C while in the presence of [place], PA-C. ? ?I have reviewed the above documentation for accuracy and completeness, and I agree with the above.   ? ?Sh

## 2021-11-09 ENCOUNTER — Ambulatory Visit: Payer: BC Managed Care – PPO | Admitting: Physical Therapy

## 2021-11-09 ENCOUNTER — Ambulatory Visit: Payer: BC Managed Care – PPO | Admitting: Urology

## 2021-11-09 ENCOUNTER — Encounter: Payer: Self-pay | Admitting: Urology

## 2021-11-09 VITALS — BP 121/85 | HR 90 | Ht 72.0 in | Wt 226.0 lb

## 2021-11-09 DIAGNOSIS — N492 Inflammatory disorders of scrotum: Secondary | ICD-10-CM

## 2021-11-09 DIAGNOSIS — R399 Unspecified symptoms and signs involving the genitourinary system: Secondary | ICD-10-CM | POA: Diagnosis not present

## 2021-11-09 DIAGNOSIS — Z881 Allergy status to other antibiotic agents status: Secondary | ICD-10-CM

## 2021-11-09 LAB — CULTURE, URINE COMPREHENSIVE

## 2021-11-09 MED ORDER — CLINDAMYCIN HCL 300 MG PO CAPS: 300.0000 mg | ORAL_CAPSULE | Freq: Three times a day (TID) | ORAL | 0 refills | Status: DC

## 2021-11-11 ENCOUNTER — Inpatient Hospital Stay: Payer: BC Managed Care – PPO | Admitting: Oncology

## 2021-11-11 ENCOUNTER — Inpatient Hospital Stay: Payer: BC Managed Care – PPO

## 2021-11-13 ENCOUNTER — Other Ambulatory Visit: Payer: BC Managed Care – PPO

## 2021-11-13 ENCOUNTER — Ambulatory Visit: Payer: BC Managed Care – PPO | Admitting: Oncology

## 2021-11-15 IMAGING — CT CT ABD-PELV W/ CM
1 of 3 series · 13 of 32 positions shown, 18 images · IV contrast (APPLIED)
Comparison: CT abdomen pelvis dated 10/22/2020.

CLINICAL DATA: 54-year-old male with abdominal pain. Concern for
acute diverticulitis.

EXAM:
CT ABDOMEN AND PELVIS WITH CONTRAST
TECHNIQUE: Multidetector CT imaging of the abdomen and pelvis was performed
using the standard protocol following bolus administration of
intravenous contrast.
CONTRAST:  100mL OMNIPAQUE IOHEXOL 300 MG/ML  SOLN

[Series 2: axial st · axial · 0.85mm/px · z∈[-1152,-678]mm · 13 of 107 slices shown, 18 images]
[im 6/107  soft-tissue]
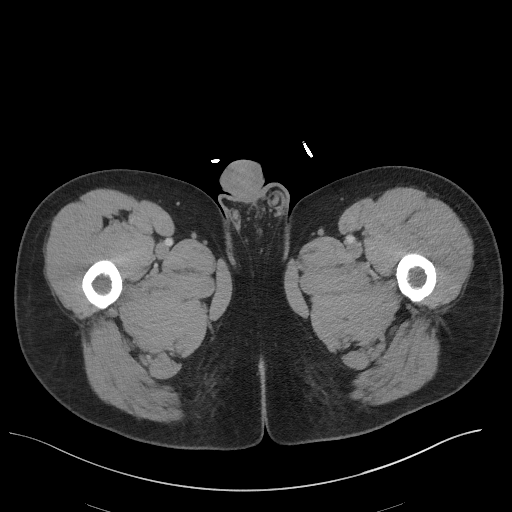
[im 6/107  bone]
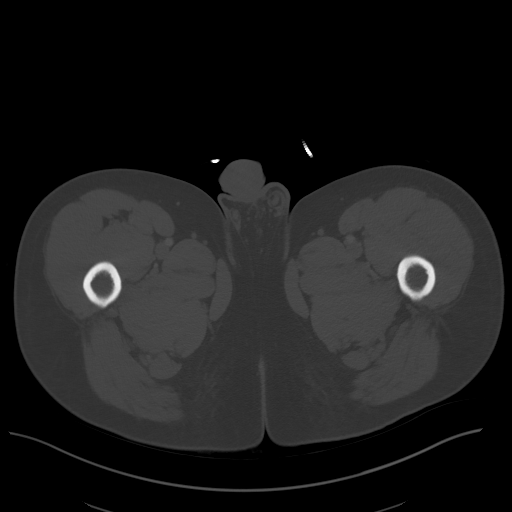
[im 18/107  soft-tissue]
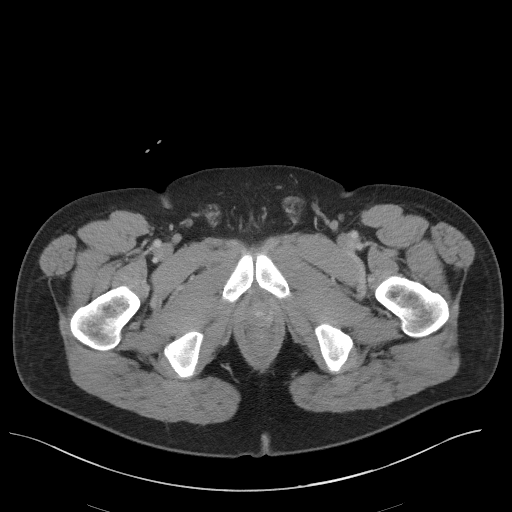
[im 24/107  soft-tissue]
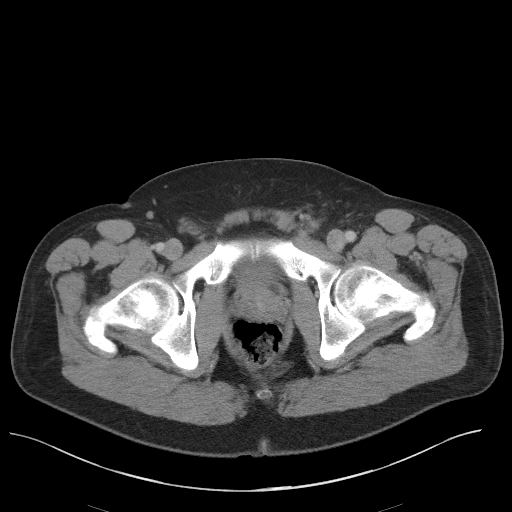
[im 30/107  soft-tissue]
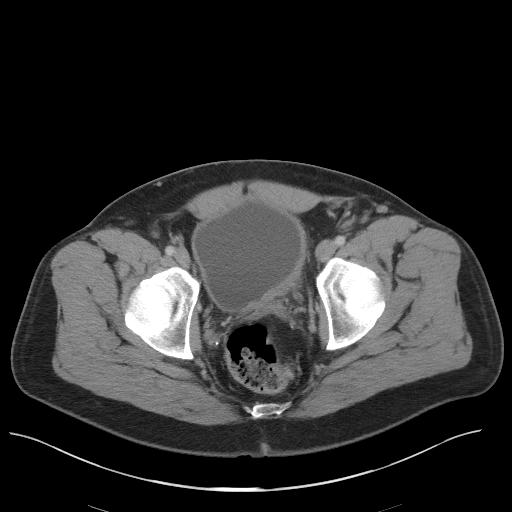
[im 42/107  soft-tissue]
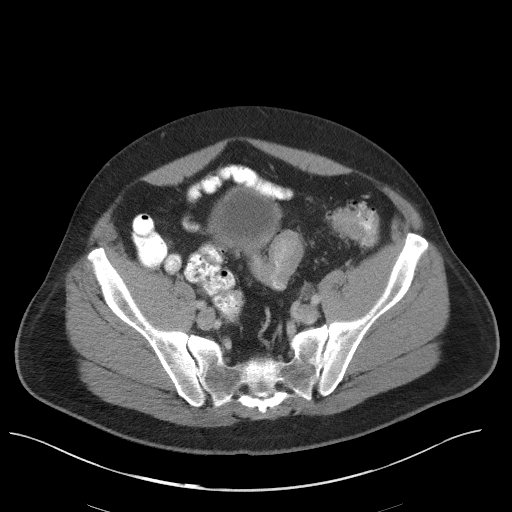
[im 48/107  soft-tissue]
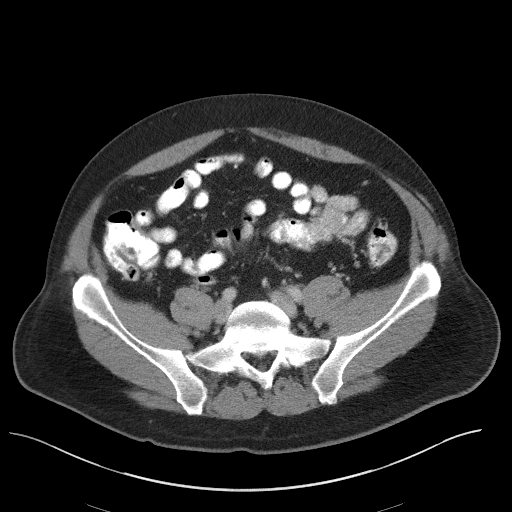
[im 59/107  soft-tissue]
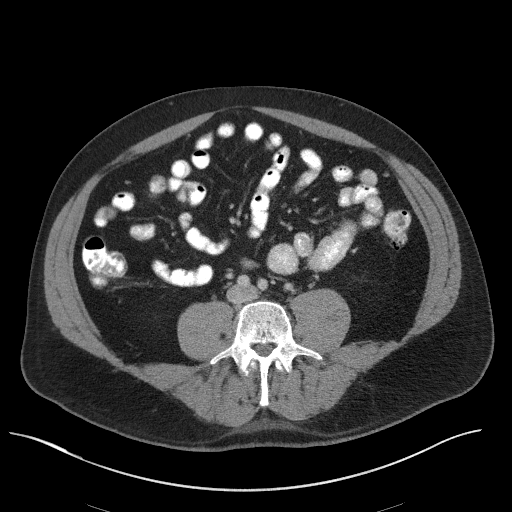
[im 65/107  soft-tissue]
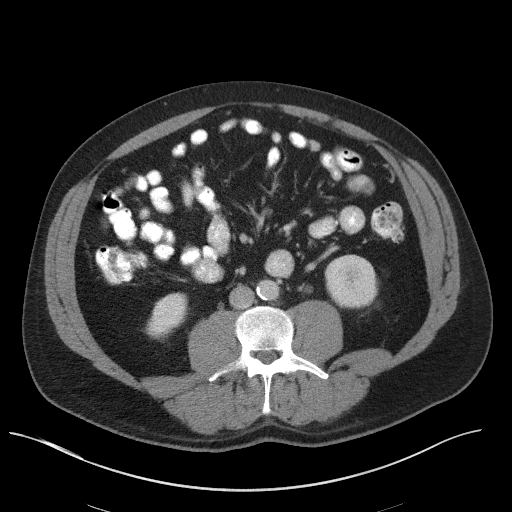
[im 77/107  soft-tissue]
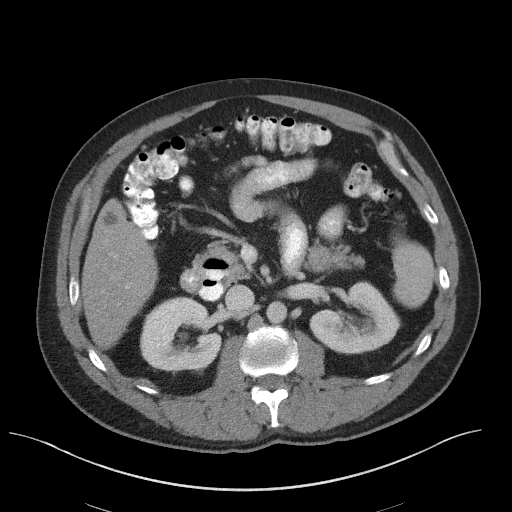
[im 77/107  bone]
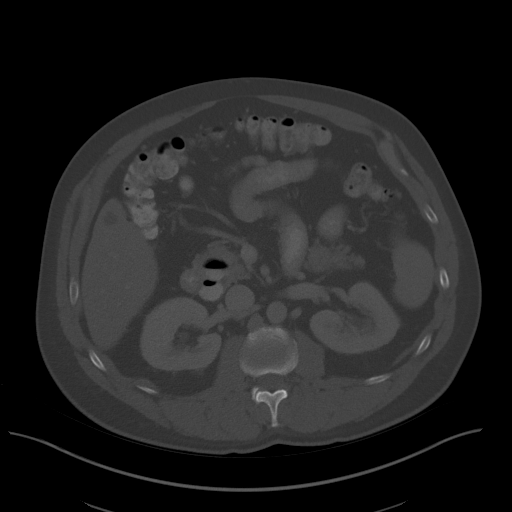
[im 83/107  soft-tissue]
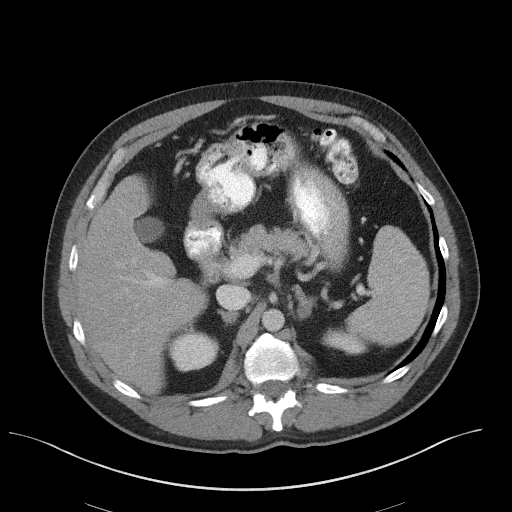
[im 83/107  lung]
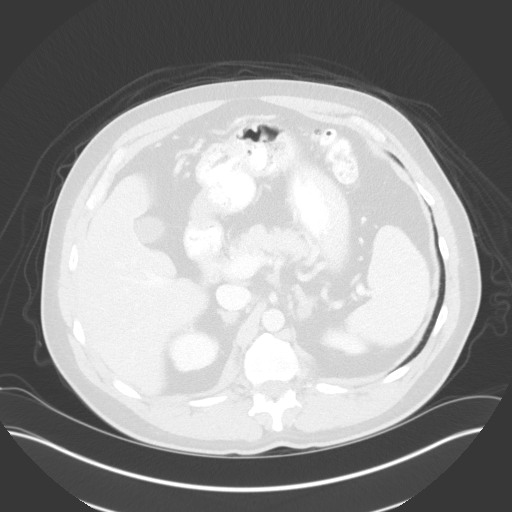
[im 89/107  soft-tissue]
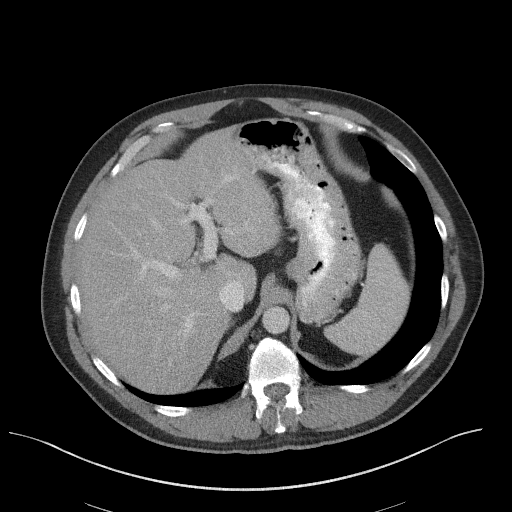
[im 89/107  lung]
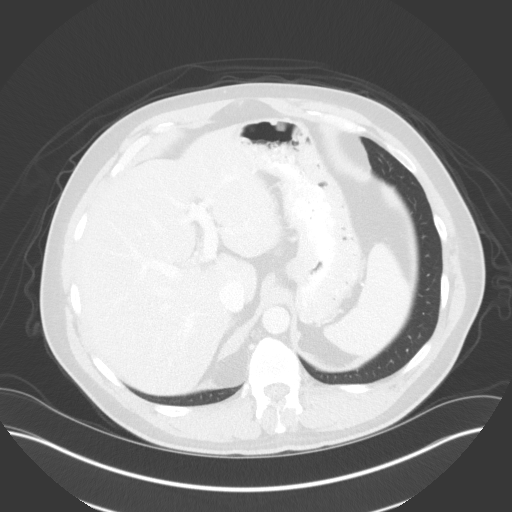
[im 95/107  lung]
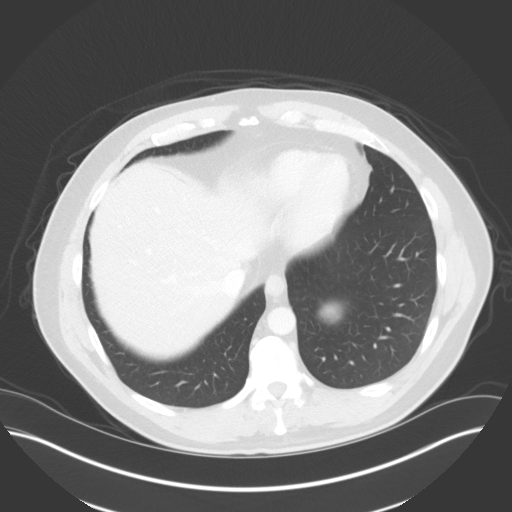
[im 101/107  soft-tissue]
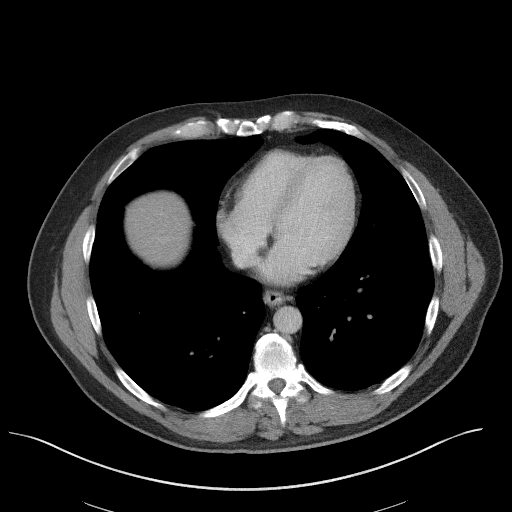
[im 101/107  lung]
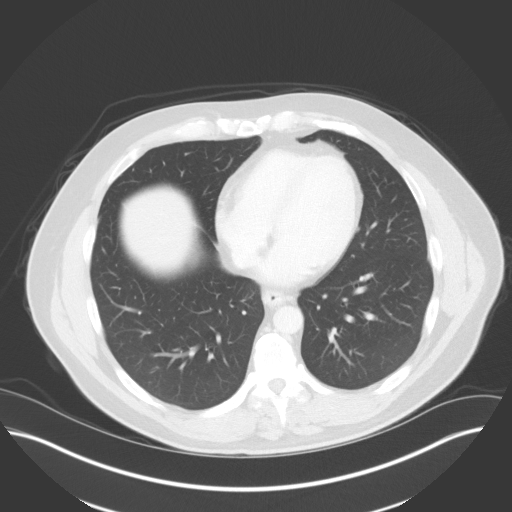

[13 of 32 positions shown; findings below may reference images not displayed]

FINDINGS: Lower chest: The visualized lung bases are clear.

No intra-abdominal free air or free fluid.

Hepatobiliary: Probable fatty liver. There is a 1 cm cyst in the
inferior right lobe of the liver. No intrahepatic biliary
dilatation. The gallbladder is unremarkable.

Pancreas: Unremarkable. No pancreatic ductal dilatation or
surrounding inflammatory changes.

Spleen: Normal in size without focal abnormality.

Adrenals/Urinary Tract: The adrenal glands unremarkable. There is no
hydronephrosis on either side. There is symmetric enhancement and
excretion of contrast by both kidneys. The visualized ureters are
unremarkable. There is asymmetric thickening and trabeculation of
the left bladder wall. There is a 1.9 x 1.3 cm fluid pocket with a
small focus of air along the posterior left bladder wall (72/2)
similar to prior CT. A track like band extends from this collection
to the sigmoid colon most concerning for a fistula. There is loss of
fat plane between the sigmoid colon and bladder wall consistent with
adhesions.

Stomach/Bowel: There is a 4 cm duodenal diverticula without active
inflammatory changes. There is sigmoid diverticulosis and diffuse
colonic diverticula. There is thickening of the sigmoid colon,
similar to prior CT and likely representing chronic inflammation and
muscular hypertrophy. There is no bowel obstruction. The appendix is
normal.

Vascular/Lymphatic: Mild aortoiliac atherosclerotic disease. The IVC
is unremarkable. No portal venous gas. There is no adenopathy.

Reproductive: Mildly enlarged prostate with median lobe hypertrophy.

Other: None

Musculoskeletal: No acute or significant osseous findings.
IMPRESSION: 1. Extensive colonic diverticulosis with muscular hypertrophy of the
sigmoid colon and adhesion to the left posterior bladder wall and
findings of colovesical fistula. The overall appearance is similar
to prior CT.
2. No bowel obstruction. Normal appendix.
3. Aortic Atherosclerosis ([id number]-[id number]).

## 2021-11-16 ENCOUNTER — Ambulatory Visit: Payer: BC Managed Care – PPO | Attending: Urology | Admitting: Physical Therapy

## 2021-11-16 ENCOUNTER — Encounter: Payer: Self-pay | Admitting: Physical Therapy

## 2021-11-16 DIAGNOSIS — M6281 Muscle weakness (generalized): Secondary | ICD-10-CM | POA: Insufficient documentation

## 2021-11-16 DIAGNOSIS — N138 Other obstructive and reflux uropathy: Secondary | ICD-10-CM | POA: Insufficient documentation

## 2021-11-16 DIAGNOSIS — N401 Enlarged prostate with lower urinary tract symptoms: Secondary | ICD-10-CM | POA: Diagnosis not present

## 2021-11-16 DIAGNOSIS — R278 Other lack of coordination: Secondary | ICD-10-CM | POA: Diagnosis present

## 2021-11-16 NOTE — Progress Notes (Signed)
? ?11/17/2021 ?3:21 PM  ? ?Aden Reuel Boom ?1966/03/21 ?[id number] ? ?Referring provider:  ?Glean Hess, MD ?[address] ?[address] ?[place],  [place] [address] ?Chief Complaint  ?Patient presents with  ? Follow-up  ? ? ?HPI: ?Craig SWADE SHONKA is a 56 y.o.male with a personal history of BPH with LUTS, urinary urgency/ frequency, and penile scrotal cellulitis who presents today for symptom recheck.  ? ?He was scoped in July 2022 which revealed intravesical portion of the prostate extending into the bladder with friable mucosa/hypervascularity with diverticula.  Bladder was noted to be trabeculated.  He was placed on finasteride at that time in addition to Flomax due to obstructive urinary symptoms, elected maximal medical therapy rather than surgical intervention for his BPH.  ?  ?He is s/p HoLEP on 09/21/2021.  ? ?He was seen in clinic on 11/04/2021 with penis scrotal cellulitis. Exam visualized mild scrotal erythema and penile erythema no evidence of apparent paraphimosis. Erythema extended to suprapubic area; his perineum is intact.  ? ?He returned the day after with Zara Council, PA-C and he had stat CT that visualized Diffuse inflammation and edema involving the penis but with relative sparing of the penile crura and pelvic floor, confined more to the shaft of the penis and the prepuce. Also associated with presumed cellulitis tracking into the scrotum and the mons pubis, suspicious for diffuse sequela of infection. Exam showed increase in severity but it was maintained within borders. He was treated with Septra  ? ?He returned back to clinic on 11/09/2021, he was noted to have an allergic reaction to septra this was switched to clindamycin. Swelling improved.  ? ?He reports that oxybutynin helped his urinary urgency.  ? ?PMH: ?Past Medical History:  ?Diagnosis Date  ? GERD (gastroesophageal reflux disease)   ? Panic attacks   ? Pneumonia   ? 2018  ? Polycythemia   ? ? ?Surgical History: ?Past  Surgical History:  ?Procedure Laterality Date  ? COLONOSCOPY WITH PROPOFOL N/A 06/18/2019  ? Procedure: COLONOSCOPY WITH BIOPSY;  Surgeon: Lucilla Lame, MD;  Location: [facility];  Service: Endoscopy;  Laterality: N/A;  ? HERNIA REPAIR    ? HOLEP-LASER ENUCLEATION OF THE PROSTATE WITH MORCELLATION N/A 09/21/2021  ? Procedure: HOLEP-LASER ENUCLEATION OF THE PROSTATE WITH MORCELLATION;  Surgeon: Hollice Espy, MD;  Location: [facility];  Service: Urology;  Laterality: N/A;  ? INGUINAL HERNIA REPAIR Left 10/28/2015  ? Procedure: HERNIA REPAIR INGUINAL ADULT;  Surgeon: Hubbard Robinson, MD;  Location: [facility];  Service: General;  Laterality: Left;  ? POLYPECTOMY  06/18/2019  ? Procedure: POLYPECTOMY;  Surgeon: Lucilla Lame, MD;  Location: [facility];  Service: Endoscopy;;  ? [place] PARTIAL COLECTOMY  2022  ? SKIN SURGERY    ? Tumor-benign  ? TONSILLECTOMY AND ADENOIDECTOMY    ? TUMOR REMOVAL    ? [place]  ? Dr. Mare Ferrari  ? ? ?Home Medications:  ?Allergies as of 11/18/2021   ? ?   Reactions  ? Septra [sulfamethoxazole-trimethoprim] Rash  ? ?  ? ?  ?Medication List  ?  ? ?  ? Accurate as of November 18, 2021 11:59 PM. If you have any questions, ask your nurse or doctor.  ?  ?  ? ?  ? ?albuterol 108 (90 Base) MCG/ACT inhaler ?Commonly known as: VENTOLIN HFA ?Inhale 2 puffs into the lungs every 6 (six) hours as needed for wheezing or shortness of breath. ?  ?aspirin 81 MG  EC tablet ?Take 81 mg by mouth daily. ?  ?cetirizine 10 MG tablet ?Commonly known as: ZYRTEC ?Take 10 mg by mouth daily. ?  ?clindamycin 300 MG capsule ?Commonly known as: CLEOCIN ?Take 1 capsule (300 mg total) by mouth 3 (three) times daily. ?  ?fluticasone 50 MCG/ACT nasal spray ?Commonly known as: FLONASE ?Place 2 sprays into both nostrils daily. ?  ?LORazepam 0.5 MG tablet ?Commonly known as: ATIVAN ?Take 1 tablet (0.5 mg total) by mouth daily as needed for anxiety. ?  ?multivitamin capsule ?Take  1 capsule by mouth daily. ?  ?omeprazole 10 MG capsule ?Commonly known as: PRILOSEC ?Take 10 mg by mouth daily. ?  ?oxybutynin 5 MG tablet ?Commonly known as: DITROPAN ?Take 1 tablet (5 mg total) by mouth every 8 (eight) hours as needed for bladder spasms. ?  ?sildenafil 20 MG tablet ?Commonly known as: Revatio ?Take 1 tablet (20 mg total) by mouth as needed. Take 1-5 tabs as needed prior to intercourse ?  ?simvastatin 10 MG tablet ?Commonly known as: ZOCOR ?Take 1 tablet (10 mg total) by mouth at bedtime. ?  ?sulfamethoxazole-trimethoprim 800-160 MG tablet ?Commonly known as: BACTRIM DS ?Take 1 tablet by mouth every 12 (twelve) hours. ?  ? ?  ? ? ?Allergies:  ?Allergies  ?Allergen Reactions  ? Septra [Sulfamethoxazole-Trimethoprim] Rash  ? ? ?Family History: ?Family History  ?Problem Relation Age of Onset  ? Thyroid disease Mother   ? Heart disease Father   ?     heart attack  ? Diabetes Father   ? Lung cancer Maternal Aunt   ? Cancer Maternal Aunt   ?     lung cancer  ? Heart disease Paternal Uncle   ? Cancer Maternal Grandfather   ?     lung cancer  ? Prostate cancer Brother 52  ? Lupus Brother   ? Prostate cancer Maternal Uncle   ? ? ?Social History:  reports that he has been smoking cigarettes. He started smoking about 38 years ago. He has a 45.00 pack-year smoking history. He has never used smokeless tobacco. He reports current alcohol use of about 9.0 standard drinks per week. He reports current drug use. Drug: Marijuana. ? ? ?Physical Exam: ?BP 124/88   Pulse 91   Ht 6' (1.829 m)   Wt 226 lb (102.5 kg)   BMI 30.65 kg/m?   ?Constitutional:  Alert and oriented, No acute distress. ?HEENT:  AT, moist mucus membranes.  Trachea midline, no masses. ?Cardiovascular: No clubbing, cyanosis, or edema. ?Respiratory: Normal respiratory effort, no increased work of breathing. ?GU: His penis is no longer erythematous he still has ever so slight erythema on suprapubic area likely secondary to irritation from pad   ?Skin: No rashes, bruises or suspicious lesions. ?Neurologic: Grossly intact, no focal deficits, moving all 4 extremities. ?Psychiatric: Normal mood and affect. ? ?Laboratory Data: ?Lab Results  ?Component Value Date  ? CREATININE 1.16 07/13/2021  ? ?Lab Results  ?Component Value Date  ? HGBA1C 5.6 09/29/2015  ? ? ?Assessment & Plan:   ?Penile scrotal cellulitis  ?- Improved still slight erythema on suprapubic area likely secondary to pad.  ? ?2. Urinary frequency/ urgency  ?- He had improvement on oxybutynin and would like to restart medication ?- Oxybutynin; prescribed  ? ?Follow-up as previously scheduled ? ?Conley Rolls as a scribe for Hollice Espy, MD.,have documented all relevant documentation on the behalf of Hollice Espy, MD,as directed by  Hollice Espy, MD while in the presence of [place]  Erlene Quan, MD. ?I have reviewed the above documentation for accuracy and completeness, and I agree with the above.  ? ?Hollice Espy, MD ? ? ?[facility] ?[address] ?[place], [place] [address] ?[phone] ?

## 2021-11-16 NOTE — Therapy (Signed)
?OUTPATIENT PHYSICAL THERAPY MALE PELVIC EVALUATION ? ? ?Patient Name: Harold Carroll ?MRN: [id number] ?DOB:[birth year], 56 y.o., male ?Today's Date: 11/16/2021 ? ? PT End of Session - 11/16/21 1636   ? ? Visit Number 1   ? Number of Visits 12   ? Date for PT Re-Evaluation 02/08/22   ? Authorization Type IE 11/16/2021   ? PT Start Time 1630   ? PT Stop Time 1710   ? PT Time Calculation (min) 40 min   ? Activity Tolerance Patient tolerated treatment well   ? Behavior During Therapy [facility] for tasks assessed/performed   ? ?  ?  ? ?  ? ? ?Past Medical History:  ?Diagnosis Date  ? GERD (gastroesophageal reflux disease)   ? Panic attacks   ? Pneumonia   ? 2018  ? Polycythemia   ? ?Past Surgical History:  ?Procedure Laterality Date  ? COLONOSCOPY WITH PROPOFOL N/A 06/18/2019  ? Procedure: COLONOSCOPY WITH BIOPSY;  Surgeon: Lucilla Lame, MD;  Location: [place];  Service: Endoscopy;  Laterality: N/A;  ? HERNIA REPAIR    ? HOLEP-LASER ENUCLEATION OF THE PROSTATE WITH MORCELLATION N/A 09/21/2021  ? Procedure: HOLEP-LASER ENUCLEATION OF THE PROSTATE WITH MORCELLATION;  Surgeon: Hollice Espy, MD;  Location: [facility];  Service: Urology;  Laterality: N/A;  ? INGUINAL HERNIA REPAIR Left 10/28/2015  ? Procedure: HERNIA REPAIR INGUINAL ADULT;  Surgeon: Hubbard Robinson, MD;  Location: [facility];  Service: General;  Laterality: Left;  ? POLYPECTOMY  06/18/2019  ? Procedure: POLYPECTOMY;  Surgeon: Lucilla Lame, MD;  Location: [place];  Service: Endoscopy;;  ? [place] PARTIAL COLECTOMY  2022  ? SKIN SURGERY    ? Tumor-benign  ? TONSILLECTOMY AND ADENOIDECTOMY    ? TUMOR REMOVAL    ? [place]  ? Dr. Mare Ferrari  ? ?Patient Active Problem List  ? Diagnosis Date Noted  ? S/P laparoscopic colectomy 01/06/2021  ? Polyp of sigmoid colon   ? Polycythemia 02/19/2019  ? Mixed hyperlipidemia 10/02/2015  ? Overweight (BMI 25.0-29.9) 09/24/2015  ? Tobacco use disorder 09/24/2015  ?  Panic disorder 09/24/2015  ? GERD (gastroesophageal reflux disease) 09/24/2015  ? Seasonal allergic rhinitis 09/24/2015  ? ? ?PCP: Glean Hess, MD ? ?REFERRING PROVIDER: Hollice Espy, MD ? ?REFERRING DIAG: N40.1,N13.8 (ICD-10-CM) - Benign prostatic hyperplasia with urinary obstruction ? ?THERAPY DIAG:  ?Muscle weakness (generalized) ? ?Other lack of coordination ? ?ONSET DATE: 09/21/2021 (HOLEP) ? ?SUBJECTIVE:                                                                                                                                                                                          ? ?  SUBJECTIVE STATEMENT: ?Patient had recent prostate surgery and is interested in improved urinary control. Patient had complicated recovery with cellulitis and allergic reaction in post-op state. Patient notes that he has started taking oxybutynin which has helped some. Patient notes that with his job he is not able to get to the bathroom as easily. Patient has nocturia 0-1x/night. Patient has to consume most of his fluids in the evening when he gets off work because he cannot consume water as readily at work without fear of leakage. Patient states that he has been practicing kegels for 10 minute bouts. Patient does report that his leakage is worse in gravity dependent positions.  ? ? ? ?PAIN:  ?Are you having pain? No ? ? ?PRECAUTIONS: None ? ?WEIGHT BEARING RESTRICTIONS No ? ?FALLS:  ?Has patient fallen in last 6 months? No ? 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 Patient has to climb ladders and lifting as well as a lot of pulling of cables.  ? ?PLOF: Independent ? ?PATIENT GOALS "get better bladder control" ? ?PERTINENT HISTORY:  ?S/p HOLEP procedure 09/21/2021 ? ?BOWEL MOVEMENT ?Patient denies any changes to BMs and notes he is at baseline.  ? ?URINATION ?Pain with urination: Yes ?Fully empty bladder: Yes:   ?Stream: Strong ?Urgency: Yes: no ability to delay. ?Frequency: 3-4x  with purpose.  ?Leakage: Urge to void, Walking to the bathroom, Coughing, Sneezing, Laughing, Exercise, Lifting, Bending forward, and Intercourse ?Pads: Yes: 10+ depends/day ?Fluids: 6 x 16.9 oz water, 2-3 cups of coffee, no alcohol ? ?SEXUAL ACTIVITY ?  Not returned to sexual activity. ? ? ? ?OBJECTIVE:  ?Mental Status ?Patient is oriented to person, place and time. ?Recent memory is intact. ?Remote memory is intact. ?Attention span and concentration are intact. ?Expressive speech is intact. ?Patient's fund of knowledge is within normal limits for educational level. ? ?POSTURE/OBSERVATIONS:  ?Lumbar lordosis: diminished ?Thoracic kyphosis: increased ?Iliac crest height: not formally assessed  ?Lumbar lateral shift: not formally assessed   ?Pelvic obliquity: not formally assessed  ?Leg length discrepancy: not formally assessed  ? ?GAIT: ?Grossly WFL. ? ?SENSATION: ?deferred 2/2 to time constraints  ? ?RANGE OF MOTION: deferred 2/2 to time constraints  ?  LEFT RIGHT  ?Lumbar forward flexion (65):      ?Lumbar extension (30):     ?Lumbar lateral flexion (25):     ?Thoracic and Lumbar rotation (30 degrees):       ?Hip Flexion (0-125):      ?Hip IR (0-45):     ?Hip ER (0-45):     ?Hip Abduction (0-40):     ?Hip extension (0-15):     ? ? ?STRENGTH: MMT deferred 2/2 to time constraints  ? RLE LLE  ?Hip Flexion    ?Hip Extension    ?Hip Abduction     ?Hip Adduction     ?Hip ER     ?Hip IR     ?Knee Extension    ?Knee Flexion    ?Dorsiflexion     ?Plantarflexion (seated)    ? ?ABDOMINAL: deferred 2/2 to time constraints  ?Palpation: ?Suprapubic percussion:  ?Diastasis: ?Scar mobility:  ?Rib flare: ? ?SPECIAL TESTS: deferred 2/2 to time constraints  ? ?PHYSICAL PERFORMANCE MEASURES:  ?STS: ?Deep Squat: ?RLE STS: ?LLE STS:  ?6MWT:  ?5TSTS: ? ?EXTERNAL PELVIC EXAM: deferred 2/2 to time constraints  ?Palpation: ?Breath coordination: ?Perineal Mobility Testing: ?Voluntary Contraction: present/absent ?Relaxation: present/delayed/  non-relaxing ?Perineal movement with sustained increase in IAP (?  bear down?):  ?Perineal movement with rapid increase in IAP (?cough?): ? ?DIGITAL RECTAL EXAM: deferred 2/2 to time constraints  ?Strength (PERF): ?Symmetry: ?Palpation: ?Prolapse: ? ? ?EDUCATION ?Patient educated on prognosis, POC, and provided with HEP including: not initiated. Patient articulated understanding and returned demonstration. Patient will benefit from further education in order to maximize compliance and understanding for long-term therapeutic gains. ? ?PATIENT SURVEYS:  ?FOTO 41; PFDI Urinary 50 ? ?ASSESSMENT: ? ?CLINICAL IMPRESSION: ?Patient is a 56 year old presenting to clinic with chief complaints of urinary incontinence s/p HOLEP. Today's evaluation is suggestive of deficits in posture, PFM coordination, PFM strength and endurance, IAP management and body mechanics as evidenced by UI with stress and urge components, increased thoracic kyphosis with anterior chain shortening, inability to delay urination in gravity dependent postures, use of 10+ protective undergarments/day. Patient's responses on FOTO outcome measures (Urinary 41; PFDI Urinary 50) indicate significant functional limitations/disability/distress. Patient's progress may be limited due to physically demanding nature of occupation; however, patient's motivation is advantageous. Patient will benefit from continued skilled therapeutic intervention to address deficits in posture, PFM coordination, PFM strength and endurance, IAP management and body mechanics in order to increase function and improve overall QOL. ? ? ?OBJECTIVE IMPAIRMENTS decreased activity tolerance, decreased coordination, decreased endurance, decreased strength, improper body mechanics, and postural dysfunction.  ? ?ACTIVITY LIMITATIONS cleaning, community activity, occupation, laundry, and yard work.  ? ?PERSONAL FACTORS Age, Behavior pattern, Fitness, Past/current experiences, and 3+ comorbidities:  GERD, panic attacks, mixed hyperlipidemia, tobacco use disorder  are also affecting patient's functional outcome.  ? ? ?REHAB POTENTIAL: Good ? ?CLINICAL DECISION MAKING: Evolving/moderate complexity ? ?EVAL

## 2021-11-18 ENCOUNTER — Ambulatory Visit (INDEPENDENT_AMBULATORY_CARE_PROVIDER_SITE_OTHER): Payer: BC Managed Care – PPO | Admitting: Urology

## 2021-11-18 VITALS — BP 124/88 | HR 91 | Ht 72.0 in | Wt 226.0 lb

## 2021-11-18 DIAGNOSIS — R3915 Urgency of urination: Secondary | ICD-10-CM | POA: Diagnosis not present

## 2021-11-18 DIAGNOSIS — R350 Frequency of micturition: Secondary | ICD-10-CM

## 2021-11-18 DIAGNOSIS — N492 Inflammatory disorders of scrotum: Secondary | ICD-10-CM

## 2021-11-25 ENCOUNTER — Ambulatory Visit: Payer: BC Managed Care – PPO | Admitting: Physical Therapy

## 2021-11-25 ENCOUNTER — Encounter: Payer: Self-pay | Admitting: Physical Therapy

## 2021-11-25 DIAGNOSIS — R278 Other lack of coordination: Secondary | ICD-10-CM

## 2021-11-25 DIAGNOSIS — M6281 Muscle weakness (generalized): Secondary | ICD-10-CM | POA: Diagnosis not present

## 2021-11-25 NOTE — Therapy (Signed)
?OUTPATIENT PHYSICAL THERAPY TREATMENT NOTE ? ? ?Patient Name: Harold Carroll ?MRN: [id number] ?DOB:1965/10/22, 56 y.o., male ?Today's Date: 11/25/2021 ? ?PCP: Glean Hess, MD ?REFERRING PROVIDER: Hollice Espy, MD ? ?END OF SESSION:  ? PT End of Session - 11/25/21 1633   ? ? Visit Number 2   ? Number of Visits 12   ? Date for PT Re-Evaluation 02/08/22   ? Authorization Type IE 11/16/2021   ? PT Start Time 1630   ? PT Stop Time 1710   ? PT Time Calculation (min) 40 min   ? Activity Tolerance Patient tolerated treatment well   ? Behavior During Therapy [facility] for tasks assessed/performed   ? ?  ?  ? ?  ? ? ?Past Medical History:  ?Diagnosis Date  ? GERD (gastroesophageal reflux disease)   ? Panic attacks   ? Pneumonia   ? 2018  ? Polycythemia   ? ?Past Surgical History:  ?Procedure Laterality Date  ? COLONOSCOPY WITH PROPOFOL N/A 06/18/2019  ? Procedure: COLONOSCOPY WITH BIOPSY;  Surgeon: Lucilla Lame, MD;  Location: [place];  Service: Endoscopy;  Laterality: N/A;  ? HERNIA REPAIR    ? HOLEP-LASER ENUCLEATION OF THE PROSTATE WITH MORCELLATION N/A 09/21/2021  ? Procedure: HOLEP-LASER ENUCLEATION OF THE PROSTATE WITH MORCELLATION;  Surgeon: Hollice Espy, MD;  Location: [facility];  Service: Urology;  Laterality: N/A;  ? INGUINAL HERNIA REPAIR Left 10/28/2015  ? Procedure: HERNIA REPAIR INGUINAL ADULT;  Surgeon: Hubbard Robinson, MD;  Location: [facility];  Service: General;  Laterality: Left;  ? POLYPECTOMY  06/18/2019  ? Procedure: POLYPECTOMY;  Surgeon: Lucilla Lame, MD;  Location: [place];  Service: Endoscopy;;  ? [place] PARTIAL COLECTOMY  2022  ? SKIN SURGERY    ? Tumor-benign  ? TONSILLECTOMY AND ADENOIDECTOMY    ? TUMOR REMOVAL    ? [place]  ? Dr. Mare Ferrari  ? ?Patient Active Problem List  ? Diagnosis Date Noted  ? S/P laparoscopic colectomy 01/06/2021  ? Polyp of sigmoid colon   ? Polycythemia 02/19/2019  ? Mixed hyperlipidemia 10/02/2015  ?  Overweight (BMI 25.0-29.9) 09/24/2015  ? Tobacco use disorder 09/24/2015  ? Panic disorder 09/24/2015  ? GERD (gastroesophageal reflux disease) 09/24/2015  ? Seasonal allergic rhinitis 09/24/2015  ? ? ?REFERRING DIAG: N40.1,N13.8 (ICD-10-CM) - Benign prostatic hyperplasia with urinary obstruction  ? ?THERAPY DIAG:  ?Muscle weakness (generalized) ? ?Other lack of coordination ? ?PERTINENT HISTORY: 09/21/2021 HOLeP ? ?PRECAUTIONS: None ? ?SUBJECTIVE: Patient notes that he has been wearing penile clamp with good effect. Patient notes good follow up with MD. Patient has been doing kegels in sets of 10 reps, twice/day.  ? ?PAIN:  ?Are you having pain? No ? ?TREATMENT ? ?Pre-treatment assessment: ?RANGE OF MOTION:  ?  LEFT RIGHT  ?Lumbar forward flexion (65):  WNL    ?Lumbar extension (30): WNL    ?Lumbar lateral flexion (25):  50% of R WNL  ?Thoracic and Lumbar rotation (30 degrees):    WNL WNL  ?Hip Flexion (0-125):   [facility] WFL  ?Hip IR (0-45):  [facility] WFL  ?Hip ER (0-45):  [facility] WFL  ?Hip Abduction (0-40):  [facility] WFL  ?Hip extension (0-15):  [facility] WFL  ? ?SENSATION: ?Grossly intact to light touch bilateral LEs as determined by testing dermatomes L2-S2 ?Proprioception and hot/cold testing deferred on this date ? ?STRENGTH: MMT  ? RLE LLE  ?Hip Flexion 5 5  ?Hip Abduction  5 5  ?  Hip Adduction  5 5  ?Hip ER  5 5  ?Hip IR  5 5  ?Knee Extension 5 5  ?Knee Flexion 5 5  ?Dorsiflexion  5 5  ?Plantarflexion (seated) 5 5  ? ?ABDOMINAL:  ?Palpation: no TTP; some increased non-neurological tone throughout abdominal wall ?Diastasis: rectus dominant, no separation ?Rib flare: R>L ? ?SPECIAL TESTS: ?SLR (SN 92, -LR 0.29): R: Negative L:  Negative ?FABER (SN 81): R: Negative L: Negative ?FADIR (SN 94): R: Negative L: Negative ? ? ?EXTERNAL PELVIC EXAM: Patient educated on the purpose of the pelvic exam and articulated understanding; patient consented to the exam verbally. ?Breath coordination: present ?Perineal Mobility Testing: ?Voluntary  Contraction: present ?Relaxation: present ?Perineal movement with sustained increase in IAP (?bear down?): perineal descent ?Perineal movement with rapid increase in IAP (?cough?): no perineal movement ? ?Manual Therapy: ? ? ?Neuromuscular Re-education: ?Sidelying diaphragmatic breathing with VCs and TCs for downregulation of the nervous system and improved management of IAP ?Sidelying, PFM contractions with exhalation. VCs and TCs to decrease compensatory patterns and encourage activation of the PFM. ?Patient education on body mechanics for unilateral tasks at work for improved postural symmetry and control. ? ?Therapeutic Exercise: ? ? ?Treatments unbilled: ? ?Post-treatment assessment: ? ?Patient educated throughout session on appropriate technique and form using multi-modal cueing, HEP, and activity modification. Patient articulated understanding and returned demonstration. ? ?Patient Response to interventions: ? ? ?PATIENT SURVEYS:  ?FOTO 41; PFDI Urinary 50 ? ?ASSESSMENT: ? ?CLINICAL IMPRESSION: ?Patient presents to clinic with excellent motivation to participate in therapy. Patient demonstrates deficits in posture, PFM coordination, PFM strength and endurance, IAP management and body mechanics. Patient able to achieve fast-twitch contractions for 5 reps in 10 seconds during today's session and responded positively to active interventions. Patient will benefit from continued skilled therapeutic intervention to address remaining deficits in posture, PFM coordination, PFM strength and endurance, IAP management and body mechanics in order to increase function, and improve overall QOL. ? ?OBJECTIVE IMPAIRMENTS decreased activity tolerance, decreased coordination, decreased endurance, decreased strength, improper body mechanics, and postural dysfunction.  ? ?ACTIVITY LIMITATIONS cleaning, community activity, occupation, laundry, and yard work.  ? ?PERSONAL FACTORS Age, Behavior pattern, Fitness, Past/current  experiences, and 3+ comorbidities: GERD, panic attacks, mixed hyperlipidemia, tobacco use disorder are also affecting patient's functional outcome.  ? ? ?REHAB POTENTIAL: Good ? ?CLINICAL DECISION MAKING: Evolving/moderate complexity ? ?EVALUATION COMPLEXITY: Moderate ? ? ?GOALS: ?LONG TERM GOALS: Target date: 02/09/2022 ? ?Patient will demonstrate improved function as evidenced by a score of 54 on FOTO measure for full participation in activities at home and in the community.  ?Baseline: 41 ?Goal status: INITIAL ? ?2.  Patient will demonstrate independence with HEP in order to maximize therapeutic gains and improve carryover from physical therapy sessions to ADLs in the home and community. ?Baseline: not initiated ?Goal status: INITIAL ? ?3.  Patient will report confidence in ability to control bladder > 7/10 in order to demonstrate improved function and ability to participate more fully in activities at home and in the community. ?Baseline: not formally assessed  ?Goal status: INITIAL ? ?4.  Patient will demonstrate circumferential and sequential contraction of >4/5 MMT, > 6 sec hold x10 and 5 consecutive quick flicks with </= 10 min rest between testing bouts, and relaxation of the PFM coordinated with breath for improved management of intra-abdominal pressure and normal bowel and bladder function without the presence of pain nor incontinence in order to improve participation at home and in the community. ?Baseline:  not formally assessed  ?Goal status: INITIAL ? ? ?PLAN: ?PT FREQUENCY: 1x/week ? ?PT DURATION: 12 weeks ? ?PLANNED INTERVENTIONS: Therapeutic exercises, Therapeutic activity, Neuromuscular re-education, Balance training, Gait training, Patient/Family education, Joint mobilization, Orthotic/Fit training, Electrical stimulation, Spinal manipulation, Spinal mobilization, Cryotherapy, Moist heat, Taping, and Manual therapy ? ?PLAN FOR NEXT SESSION: deep core ? ?Myles Gip PT, DPT [phone]  ?11/25/2021,  4:34 PM ? ?  ? ?

## 2021-12-03 ENCOUNTER — Ambulatory Visit: Payer: BC Managed Care – PPO | Attending: Urology | Admitting: Physical Therapy

## 2021-12-03 ENCOUNTER — Encounter: Payer: Self-pay | Admitting: Physical Therapy

## 2021-12-03 DIAGNOSIS — M6281 Muscle weakness (generalized): Secondary | ICD-10-CM | POA: Insufficient documentation

## 2021-12-03 DIAGNOSIS — R278 Other lack of coordination: Secondary | ICD-10-CM | POA: Insufficient documentation

## 2021-12-03 NOTE — Therapy (Signed)
?OUTPATIENT PHYSICAL THERAPY TREATMENT NOTE ? ? ?Patient Name: Harold Carroll ?MRN: [id number] ?DOB:01/02/1966, 56 y.o., male ?Today's Date: 12/03/2021 ? ?PCP: Glean Hess, MD ?REFERRING PROVIDER: Hollice Espy, MD ? ?END OF SESSION:  ? PT End of Session - 12/03/21 1628   ? ? Visit Number 3   ? Number of Visits 12   ? Date for PT Re-Evaluation 02/08/22   ? Authorization Type IE 11/16/2021   ? PT Start Time 1630   ? PT Stop Time 1710   ? PT Time Calculation (min) 40 min   ? Activity Tolerance Patient tolerated treatment well   ? Behavior During Therapy [facility] for tasks assessed/performed   ? ?  ?  ? ?  ? ? ?Past Medical History:  ?Diagnosis Date  ? GERD (gastroesophageal reflux disease)   ? Panic attacks   ? Pneumonia   ? 2018  ? Polycythemia   ? ?Past Surgical History:  ?Procedure Laterality Date  ? COLONOSCOPY WITH PROPOFOL N/A 06/18/2019  ? Procedure: COLONOSCOPY WITH BIOPSY;  Surgeon: Lucilla Lame, MD;  Location: [place];  Service: Endoscopy;  Laterality: N/A;  ? HERNIA REPAIR    ? HOLEP-LASER ENUCLEATION OF THE PROSTATE WITH MORCELLATION N/A 09/21/2021  ? Procedure: HOLEP-LASER ENUCLEATION OF THE PROSTATE WITH MORCELLATION;  Surgeon: Hollice Espy, MD;  Location: [facility];  Service: Urology;  Laterality: N/A;  ? INGUINAL HERNIA REPAIR Left 10/28/2015  ? Procedure: HERNIA REPAIR INGUINAL ADULT;  Surgeon: Hubbard Robinson, MD;  Location: [facility];  Service: General;  Laterality: Left;  ? POLYPECTOMY  06/18/2019  ? Procedure: POLYPECTOMY;  Surgeon: Lucilla Lame, MD;  Location: [place];  Service: Endoscopy;;  ? [place] PARTIAL COLECTOMY  2022  ? SKIN SURGERY    ? Tumor-benign  ? TONSILLECTOMY AND ADENOIDECTOMY    ? TUMOR REMOVAL    ? [place]  ? Dr. Mare Ferrari  ? ?Patient Active Problem List  ? Diagnosis Date Noted  ? S/P laparoscopic colectomy 01/06/2021  ? Polyp of sigmoid colon   ? Polycythemia 02/19/2019  ? Mixed hyperlipidemia 10/02/2015  ?  Overweight (BMI 25.0-29.9) 09/24/2015  ? Tobacco use disorder 09/24/2015  ? Panic disorder 09/24/2015  ? GERD (gastroesophageal reflux disease) 09/24/2015  ? Seasonal allergic rhinitis 09/24/2015  ? ? ?REFERRING DIAG: N40.1,N13.8 (ICD-10-CM) - Benign prostatic hyperplasia with urinary obstruction  ? ?THERAPY DIAG:  ?Muscle weakness (generalized) ? ?Other lack of coordination ? ?PERTINENT HISTORY: 09/21/2021 HOLeP ? ?PRECAUTIONS: None ? ?SUBJECTIVE: Patient states that he has been doing his exercises. Patient notes that over the weekend he was doing some painting and had to move a lot of furniture. He did have hematuria later in the day one time, but hasn't happened since then. Patient notes that on Sunday he had some increased leakage while painting baseboards with the repeated floor transfers. Patient does note some improved control with urge in the evening; he notes very minimal leakage and he was able to sustain the PFM contraction.  ? ?PAIN:  ?Are you having pain? No ? ?TREATMENT ? ?11/25/2021 assessment: ?RANGE OF MOTION:  ?  LEFT RIGHT  ?Lumbar forward flexion (65):  WNL    ?Lumbar extension (30): WNL    ?Lumbar lateral flexion (25):  50% of R WNL  ?Thoracic and Lumbar rotation (30 degrees):    WNL WNL  ?Hip Flexion (0-125):   [facility] WFL  ?Hip IR (0-45):  [facility] WFL  ?Hip ER (0-45):  [facility] WFL  ?  Hip Abduction (0-40):  [facility] WFL  ?Hip extension (0-15):  [facility] WFL  ? ?SENSATION: ?Grossly intact to light touch bilateral LEs as determined by testing dermatomes L2-S2 ?Proprioception and hot/cold testing deferred on this date ? ?STRENGTH: MMT  ? RLE LLE  ?Hip Flexion 5 5  ?Hip Abduction  5 5  ?Hip Adduction  5 5  ?Hip ER  5 5  ?Hip IR  5 5  ?Knee Extension 5 5  ?Knee Flexion 5 5  ?Dorsiflexion  5 5  ?Plantarflexion (seated) 5 5  ? ?ABDOMINAL:  ?Palpation: no TTP; some increased non-neurological tone throughout abdominal wall ?Diastasis: rectus dominant, no separation ?Rib flare: R>L ? ?SPECIAL TESTS: ?SLR (SN 92, -LR 0.29): R:  Negative L:  Negative ?FABER (SN 81): R: Negative L: Negative ?FADIR (SN 94): R: Negative L: Negative ? ? ?EXTERNAL PELVIC EXAM: Patient educated on the purpose of the pelvic exam and articulated understanding; patient consented to the exam verbally. ?Breath coordination: present ?Perineal Mobility Testing: ?Voluntary Contraction: present ?Relaxation: present ?Perineal movement with sustained increase in IAP (?bear down?): perineal descent ?Perineal movement with rapid increase in IAP (?cough?): no perineal movement ? ?Manual Therapy: ?MFR of diaphragm with coordinated breath for improved IAP management and posture ? ?Neuromuscular Re-education: ?Patient performed supine postural control interventions as follows: ?Supine hooklying diaphragmatic breathing ?Supine Isometric Transversus Abdominis Contraction  - 10 reps ?Bent Knee Fallouts  with TrA activation- 10 reps ?Scapula Isolations with TrA activation - 10 reps ? ? ?Therapeutic Exercise: ? ? ?Treatments unbilled: ? ?Post-treatment assessment: ? ?Patient educated throughout session on appropriate technique and form using multi-modal cueing, HEP, and activity modification. Patient articulated understanding and returned demonstration. ? ?Patient Response to interventions: ?Comfortable to work on HEP for 1-2 weeks ? ?PATIENT SURVEYS:  ?FOTO 41; PFDI Urinary 50 ? ?ASSESSMENT: ? ?CLINICAL IMPRESSION: ?Patient presents to clinic with excellent motivation to participate in therapy. Patient demonstrates deficits in posture, PFM coordination, PFM strength and endurance, IAP management and body mechanics. Patient able to achieve coordinate all supine postural control interventions with minimal to moderate cueing during today's session and responded positively to active and manual interventions. Patient will benefit from continued skilled therapeutic intervention to address remaining deficits in posture, PFM coordination, PFM strength and endurance, IAP management and body  mechanics in order to increase function, and improve overall QOL. ? ?OBJECTIVE IMPAIRMENTS decreased activity tolerance, decreased coordination, decreased endurance, decreased strength, improper body mechanics, and postural dysfunction.  ? ?ACTIVITY LIMITATIONS cleaning, community activity, occupation, laundry, and yard work.  ? ?PERSONAL FACTORS Age, Behavior pattern, Fitness, Past/current experiences, and 3+ comorbidities: GERD, panic attacks, mixed hyperlipidemia, tobacco use disorder are also affecting patient's functional outcome.  ? ? ?REHAB POTENTIAL: Good ? ?CLINICAL DECISION MAKING: Evolving/moderate complexity ? ?EVALUATION COMPLEXITY: Moderate ? ? ?GOALS: ?LONG TERM GOALS: Target date: 02/09/2022 ? ?Patient will demonstrate improved function as evidenced by a score of 54 on FOTO measure for full participation in activities at home and in the community.  ?Baseline: 41 ?Goal status: INITIAL ? ?2.  Patient will demonstrate independence with HEP in order to maximize therapeutic gains and improve carryover from physical therapy sessions to ADLs in the home and community. ?Baseline: not initiated ?Goal status: INITIAL ? ?3.  Patient will report confidence in ability to control bladder > 7/10 in order to demonstrate improved function and ability to participate more fully in activities at home and in the community. ?Baseline: not formally assessed  ?Goal status: INITIAL ? ?4.  Patient  will demonstrate circumferential and sequential contraction of >4/5 MMT, > 6 sec hold x10 and 5 consecutive quick flicks with </= 10 min rest between testing bouts, and relaxation of the PFM coordinated with breath for improved management of intra-abdominal pressure and normal bowel and bladder function without the presence of pain nor incontinence in order to improve participation at home and in the community. ?Baseline: not formally assessed  ?Goal status: INITIAL ? ? ?PLAN: ?PT FREQUENCY: 1x/week ? ?PT DURATION: 12  weeks ? ?PLANNED INTERVENTIONS: Therapeutic exercises, Therapeutic activity, Neuromuscular re-education, Balance training, Gait training, Patient/Family education, Joint mobilization, Orthotic/Fit training, Electrical

## 2021-12-07 ENCOUNTER — Encounter: Payer: BC Managed Care – PPO | Admitting: Physical Therapy

## 2021-12-13 ENCOUNTER — Other Ambulatory Visit: Payer: Self-pay | Admitting: *Deleted

## 2021-12-13 DIAGNOSIS — D751 Secondary polycythemia: Secondary | ICD-10-CM

## 2021-12-14 ENCOUNTER — Inpatient Hospital Stay: Payer: BC Managed Care – PPO | Attending: Oncology

## 2021-12-14 ENCOUNTER — Ambulatory Visit: Payer: BC Managed Care – PPO | Admitting: Physical Therapy

## 2021-12-14 ENCOUNTER — Inpatient Hospital Stay: Payer: BC Managed Care – PPO

## 2021-12-14 ENCOUNTER — Inpatient Hospital Stay: Payer: BC Managed Care – PPO | Admitting: Oncology

## 2021-12-14 ENCOUNTER — Encounter: Payer: Self-pay | Admitting: Oncology

## 2021-12-14 VITALS — BP 124/60 | HR 84 | Temp 97.7°F | Resp 18 | Wt 222.0 lb

## 2021-12-14 DIAGNOSIS — D751 Secondary polycythemia: Secondary | ICD-10-CM | POA: Insufficient documentation

## 2021-12-14 LAB — CBC
HCT: 43.7 % (ref 39.0–52.0)
Hemoglobin: 15.1 g/dL (ref 13.0–17.0)
MCH: 29.5 pg (ref 26.0–34.0)
MCHC: 34.6 g/dL (ref 30.0–36.0)
MCV: 85.5 fL (ref 80.0–100.0)
Platelets: 269 10*3/uL (ref 150–400)
RBC: 5.11 MIL/uL (ref 4.22–5.81)
RDW: 12.2 % (ref 11.5–15.5)
WBC: 7.7 10*3/uL (ref 4.0–10.5)
nRBC: 0 % (ref 0.0–0.2)

## 2021-12-14 NOTE — Progress Notes (Signed)
Pt states that at times he has been feeling fatigued; when waking up he feels fine and rested but throughout the day he feels more fatigued. Also, being followed by PT for bladder control issues.

## 2021-12-14 NOTE — Progress Notes (Signed)
HCT 43.7; No phlebotomy needed today. ?

## 2021-12-17 ENCOUNTER — Encounter: Payer: Self-pay | Admitting: Physical Therapy

## 2021-12-17 ENCOUNTER — Ambulatory Visit: Payer: BC Managed Care – PPO | Admitting: Physical Therapy

## 2021-12-17 ENCOUNTER — Encounter: Payer: Self-pay | Admitting: Oncology

## 2021-12-17 DIAGNOSIS — M6281 Muscle weakness (generalized): Secondary | ICD-10-CM

## 2021-12-17 DIAGNOSIS — R278 Other lack of coordination: Secondary | ICD-10-CM

## 2021-12-17 NOTE — Progress Notes (Signed)
Hematology/Oncology Consult note [facility]  [phone] [phone]  Patient Care Team: Glean Hess, MD as PCP - General (Internal Medicine) Lucilla Lame, MD as Consulting Physician (Gastroenterology) Jules Husbands, MD as Consulting Physician (General Surgery) Hollice Espy, MD as Consulting Physician (Urology)   Name of the patient: Harold Carroll  [id number]  1966-03-03   Date of visit: 12/17/21  Diagnosis-polycythemia possibly due to smoking  Chief complaint/ Reason for visit-routine follow-up of secondary polycythemia  Heme/Onc history: Patient is a 56 year old male with a past medical history significant for obesity, hyperlipidemia who has been referred to [place] for polycythemia.  Of note patient has had an elevated hemoglobin dating back to 2016 and his yearly hemoglobin trends have been 17.8, 18.8, 19.3 and then most recently 20.1 on 01/24/2019.  Hemoglobin and platelet counts have been normal.  He does have a chronic smoking history and currently smokes 1 pack/day and has been doing so for the last 30 years.  He denies any exogenous testosterone use.  He does report getting a good night sleep and wakes up in the morning feeling refreshed but does note that his partner has noticed that he snores at night.  He does not have any known chronic lung disease.    EPO level was normal. JAK2 mutation testing was negative. UA showed no hematuria  Interval history-Patient currently reports some fatigue but denies other complaints at this time  ECOG PS- 1 Pain scale- 0   Review of systems- Review of Systems  Constitutional:  Positive for malaise/fatigue. Negative for chills, fever and weight loss.  HENT:  Negative for congestion, ear discharge and nosebleeds.   Eyes:  Negative for blurred vision.  Respiratory:  Negative for cough, hemoptysis, sputum production, shortness of breath and wheezing.   Cardiovascular:  Negative for  chest pain, palpitations, orthopnea and claudication.  Gastrointestinal:  Negative for abdominal pain, blood in stool, constipation, diarrhea, heartburn, melena, nausea and vomiting.  Genitourinary:  Negative for dysuria, flank pain, frequency, hematuria and urgency.  Musculoskeletal:  Negative for back pain, joint pain and myalgias.  Skin:  Negative for rash.  Neurological:  Negative for dizziness, tingling, focal weakness, seizures, weakness and headaches.  Endo/Heme/Allergies:  Does not bruise/bleed easily.  Psychiatric/Behavioral:  Negative for depression and suicidal ideas. The patient does not have insomnia.       Allergies  Allergen Reactions   Septra [Sulfamethoxazole-Trimethoprim] Rash     Past Medical History:  Diagnosis Date   GERD (gastroesophageal reflux disease)    Panic attacks    Pneumonia    2018   Polycythemia      Past Surgical History:  Procedure Laterality Date   COLONOSCOPY WITH PROPOFOL N/A 06/18/2019   Procedure: COLONOSCOPY WITH BIOPSY;  Surgeon: Lucilla Lame, MD;  Location: [place];  Service: Endoscopy;  Laterality: N/A;   HERNIA REPAIR     HOLEP-LASER ENUCLEATION OF THE PROSTATE WITH MORCELLATION N/A 09/21/2021   Procedure: HOLEP-LASER ENUCLEATION OF THE PROSTATE WITH MORCELLATION;  Surgeon: Hollice Espy, MD;  Location: [facility];  Service: Urology;  Laterality: N/A;   INGUINAL HERNIA REPAIR Left 10/28/2015   Procedure: HERNIA REPAIR INGUINAL ADULT;  Surgeon: Hubbard Robinson, MD;  Location: [facility];  Service: General;  Laterality: Left;   POLYPECTOMY  06/18/2019   Procedure: POLYPECTOMY;  Surgeon: Lucilla Lame, MD;  Location: [place];  Service: Endoscopy;;   ROBOT ASSISTED LAPAROSCOPIC PARTIAL COLECTOMY  2022   SKIN SURGERY  Tumor-benign   TONSILLECTOMY AND ADENOIDECTOMY     TUMOR REMOVAL     VENTRAL HERNIA REPAIR  1968   Dr. Mare Ferrari    Social History   Socioeconomic History   Marital status: Significant  Other    Spouse name: Not on file   Number of children: Not on file   Years of education: Not on file   Highest education level: Not on file  Occupational History   Not on file  Tobacco Use   Smoking status: Every Day    Packs/day: 1.50    Years: 30.00    Pack years: 45.00    Types: Cigarettes    Start date: 10/22/1983    Last attempt to quit: 10/08/2015    Years since quitting: 6.1   Smokeless tobacco: Never   Tobacco comments:    off and on since 1985  Vaping Use   Vaping Use: Never used  Substance and Sexual Activity   Alcohol use: Yes    Alcohol/week: 9.0 standard drinks    Types: 1 Cans of beer, 8 Standard drinks or equivalent per week    Comment: pt has hardly any drinks during week and weekends drinks more mostly liquor and some beer   Drug use: Yes    Types: Marijuana    Comment: every now and then about 1 time month   Sexual activity: Yes  Other Topics Concern   Not on file  Social History Narrative   Not on file   Social Determinants of Health   Financial Resource Strain: Not on file  Food Insecurity: Not on file  Transportation Needs: Not on file  Physical Activity: Not on file  Stress: Not on file  Social Connections: Not on file  Intimate Partner Violence: Not on file    Family History  Problem Relation Age of Onset   Thyroid disease Mother    Heart disease Father        heart attack   Diabetes Father    Lung cancer Maternal Aunt    Cancer Maternal Aunt        lung cancer   Heart disease Paternal Uncle    Cancer Maternal Grandfather        lung cancer   Prostate cancer Brother 70   Lupus Brother    Prostate cancer Maternal Uncle      Current Outpatient Medications:    aspirin 81 MG EC tablet, Take 81 mg by mouth daily., Disp: , Rfl:    cetirizine (ZYRTEC) 10 MG tablet, Take 10 mg by mouth daily., Disp: , Rfl:    fluticasone (FLONASE) 50 MCG/ACT nasal spray, Place 2 sprays into both nostrils daily., Disp: 16 g, Rfl: 6   Multiple Vitamin  (MULTIVITAMIN) capsule, Take 1 capsule by mouth daily., Disp: , Rfl:    omeprazole (PRILOSEC) 10 MG capsule, Take 10 mg by mouth daily., Disp: , Rfl:    sildenafil (REVATIO) 20 MG tablet, Take 1 tablet (20 mg total) by mouth as needed. Take 1-5 tabs as needed prior to intercourse, Disp: 30 tablet, Rfl: 11   simvastatin (ZOCOR) 10 MG tablet, Take 1 tablet (10 mg total) by mouth at bedtime., Disp: 90 tablet, Rfl: 3   albuterol (VENTOLIN HFA) 108 (90 Base) MCG/ACT inhaler, Inhale 2 puffs into the lungs every 6 (six) hours as needed for wheezing or shortness of breath. (Patient not taking: Reported on 12/14/2021), Disp: 8 g, Rfl: 0   LORazepam (ATIVAN) 0.5 MG tablet, Take 1 tablet (0.5 mg  total) by mouth daily as needed for anxiety. (Patient not taking: Reported on 12/14/2021), Disp: 10 tablet, Rfl: 0   oxybutynin (DITROPAN) 5 MG tablet, Take 1 tablet (5 mg total) by mouth every 8 (eight) hours as needed for bladder spasms. (Patient not taking: Reported on 12/14/2021), Disp: 30 tablet, Rfl: 0  Physical exam:  Vitals:   12/14/21 1254  BP: 124/60  Pulse: 84  Resp: 18  Temp: 97.7 F (36.5 C)  SpO2: 97%  Weight: 222 lb (100.7 kg)   Physical Exam Cardiovascular:     Rate and Rhythm: Normal rate and regular rhythm.     Heart sounds: Normal heart sounds.  Pulmonary:     Effort: Pulmonary effort is normal.     Breath sounds: Normal breath sounds.  Abdominal:     General: Bowel sounds are normal.     Palpations: Abdomen is soft.  Skin:    General: Skin is warm and dry.  Neurological:     Mental Status: He is alert and oriented to person, place, and time.        Latest Ref Rng & Units 07/13/2021    8:34 AM  CMP  Glucose 70 - 99 mg/dL 113    BUN 6 - 24 mg/dL 15    Creatinine 0.76 - 1.27 mg/dL 1.16    Sodium 134 - 144 mmol/L 136    Potassium 3.5 - 5.2 mmol/L 4.8    Chloride 96 - 106 mmol/L 97    CO2 20 - 29 mmol/L 24    Calcium 8.7 - 10.2 mg/dL 9.7    Total Protein 6.0 - 8.5 g/dL 7.0     Total Bilirubin 0.0 - 1.2 mg/dL 0.8    Alkaline Phos 44 - 121 IU/L 144    AST 0 - 40 IU/L 30    ALT 0 - 44 IU/L 30        Latest Ref Rng & Units 12/14/2021   12:34 PM  CBC  WBC 4.0 - 10.5 K/uL 7.7    Hemoglobin 13.0 - 17.0 g/dL 15.1    Hematocrit 39.0 - 52.0 % 43.7    Platelets 150 - 400 K/uL 269      Assessment and plan- Patient is a 56 y.o. male here for follow-up of secondary polycythemia  Patient's hemoglobin is currently15.1 with a hematocrit of 43.7.  He does not require phlebotomy at this time and his fatigue is not accounted for by his polycythemia.  Will consider phlebotomy if hematocrit is greater than 55.  Repeat CBC in 6 months in 1 year and I will see him back in 1 year   Visit Diagnosis 1. Secondary polycythemia      Dr. Randa Evens, MD, MPH [facility] [phone] 12/17/2021 10:40 AM

## 2021-12-17 NOTE — Therapy (Signed)
OUTPATIENT PHYSICAL THERAPY TREATMENT NOTE   Patient Name: Harold Carroll MRN: [id number] DOB:[birth year], 56 y.o., male Today's Date: 12/17/2021  PCP: Glean Hess, MD REFERRING PROVIDER: Hollice Espy, MD  END OF SESSION:   PT End of Session - 12/17/21 1623     Visit Number 4    Number of Visits 12    Date for PT Re-Evaluation 02/08/22    Authorization Type IE 11/16/2021    PT Start Time 1623    PT Stop Time 1705    PT Time Calculation (min) 42 min    Activity Tolerance Patient tolerated treatment well    Behavior During Therapy WFL for tasks assessed/performed             Past Medical History:  Diagnosis Date   GERD (gastroesophageal reflux disease)    Panic attacks    Pneumonia    2018   Polycythemia    Past Surgical History:  Procedure Laterality Date   COLONOSCOPY WITH PROPOFOL N/A 06/18/2019   Procedure: COLONOSCOPY WITH BIOPSY;  Surgeon: Lucilla Lame, MD;  Location: [place];  Service: Endoscopy;  Laterality: N/A;   HERNIA REPAIR     HOLEP-LASER ENUCLEATION OF THE PROSTATE WITH MORCELLATION N/A 09/21/2021   Procedure: HOLEP-LASER ENUCLEATION OF THE PROSTATE WITH MORCELLATION;  Surgeon: Hollice Espy, MD;  Location: [facility];  Service: Urology;  Laterality: N/A;   INGUINAL HERNIA REPAIR Left 10/28/2015   Procedure: HERNIA REPAIR INGUINAL ADULT;  Surgeon: Hubbard Robinson, MD;  Location: [facility];  Service: General;  Laterality: Left;   POLYPECTOMY  06/18/2019   Procedure: POLYPECTOMY;  Surgeon: Lucilla Lame, MD;  Location: [place];  Service: Endoscopy;;   ROBOT ASSISTED LAPAROSCOPIC PARTIAL COLECTOMY  2022   SKIN SURGERY     Tumor-benign   TONSILLECTOMY AND ADENOIDECTOMY     TUMOR REMOVAL     VENTRAL HERNIA REPAIR  1968   Dr. Mare Ferrari   Patient Active Problem List   Diagnosis Date Noted   S/P laparoscopic colectomy 01/06/2021   Polyp of sigmoid colon    Polycythemia 02/19/2019   Mixed hyperlipidemia 10/02/2015    Overweight (BMI 25.0-29.9) 09/24/2015   Tobacco use disorder 09/24/2015   Panic disorder 09/24/2015   GERD (gastroesophageal reflux disease) 09/24/2015   Seasonal allergic rhinitis 09/24/2015    REFERRING DIAG: N40.1,N13.8 (ICD-10-CM) - Benign prostatic hyperplasia with urinary obstruction   THERAPY DIAG:  Muscle weakness (generalized)  Other lack of coordination  PERTINENT HISTORY: 09/21/2021 HOLeP  PRECAUTIONS: None  SUBJECTIVE: Patient reports that he is continuing to do exercises. On Sundays when he is not wearing incontinence clamp, he goes through about 5-6 briefs.   PAIN:  Are you having pain? No  TREATMENT  11/25/2021 assessment: RANGE OF MOTION:    LEFT RIGHT  Lumbar forward flexion (65):  WNL    Lumbar extension (30): WNL    Lumbar lateral flexion (25):  50% of R WNL  Thoracic and Lumbar rotation (30 degrees):    WNL WNL  Hip Flexion (0-125):   [facility] WFL  Hip IR (0-45):  [facility] WFL  Hip ER (0-45):  [facility] WFL  Hip Abduction (0-40):  [facility] WFL  Hip extension (0-15):  [facility] WFL   SENSATION: Grossly intact to light touch bilateral LEs as determined by testing dermatomes L2-S2 Proprioception and hot/cold testing deferred on this date  STRENGTH: MMT   RLE LLE  Hip Flexion 5 5  Hip Abduction  5 5  Hip Adduction  5 5  Hip ER  5 5  Hip IR  5 5  Knee Extension 5 5  Knee Flexion 5 5  Dorsiflexion  5 5  Plantarflexion (seated) 5 5   ABDOMINAL:  Palpation: no TTP; some increased non-neurological tone throughout abdominal wall Diastasis: rectus dominant, no separation Rib flare: R>L  SPECIAL TESTS: SLR (SN 92, -LR 0.29): R: Negative L:  Negative FABER (SN 81): R: Negative L: Negative FADIR (SN 94): R: Negative L: Negative   EXTERNAL PELVIC EXAM: Patient educated on the purpose of the pelvic exam and articulated understanding; patient consented to the exam verbally. Breath coordination: present Perineal Mobility Testing: Voluntary Contraction: present Relaxation:  present Perineal movement with sustained increase in IAP ("bear down"): perineal descent Perineal movement with rapid increase in IAP ("cough"): no perineal movement  Manual Therapy: MFR of diaphragm with coordinated breath for improved IAP management and posture  Neuromuscular Re-education: Standing open book at wall for improved postural awareness and mobility at thoracolumbar junction Standing neutral posture with wall feedback, with and without scapular adduction for improved postural awareness and alignment Patient education on impact of posture on distribution of pressure/force through the pelvis for improved understanding and management of SUI.   Therapeutic Exercise:   Treatments unbilled:  Post-treatment assessment:  Patient educated throughout session on appropriate technique and form using multi-modal cueing, HEP, and activity modification. Patient articulated understanding and returned demonstration.  Patient Response to interventions:   PATIENT SURVEYS:  FOTO 41; PFDI Urinary 50  ASSESSMENT:  CLINICAL IMPRESSION: Patient presents to clinic with excellent motivation to participate in therapy. Patient demonstrates deficits in posture, PFM coordination, PFM strength and endurance, IAP management and body mechanics. Patient required moderate cueing for postural interventions at wall during today's session and responded positively to active and manual interventions. Patient will benefit from continued skilled therapeutic intervention to address remaining deficits in posture, PFM coordination, PFM strength and endurance, IAP management and body mechanics in order to increase function, and improve overall QOL.  OBJECTIVE IMPAIRMENTS decreased activity tolerance, decreased coordination, decreased endurance, decreased strength, improper body mechanics, and postural dysfunction.   ACTIVITY LIMITATIONS cleaning, community activity, occupation, laundry, and yard work.   PERSONAL  FACTORS Age, Behavior pattern, Fitness, Past/current experiences, and 3+ comorbidities: GERD, panic attacks, mixed hyperlipidemia, tobacco use disorder are also affecting patient's functional outcome.    REHAB POTENTIAL: Good  CLINICAL DECISION MAKING: Evolving/moderate complexity  EVALUATION COMPLEXITY: Moderate   GOALS: LONG TERM GOALS: Target date: 02/09/2022  Patient will demonstrate improved function as evidenced by a score of 54 on FOTO measure for full participation in activities at home and in the community.  Baseline: 41 Goal status: INITIAL  2.  Patient will demonstrate independence with HEP in order to maximize therapeutic gains and improve carryover from physical therapy sessions to ADLs in the home and community. Baseline: not initiated Goal status: INITIAL  3.  Patient will report confidence in ability to control bladder > 7/10 in order to demonstrate improved function and ability to participate more fully in activities at home and in the community. Baseline: not formally assessed  Goal status: INITIAL  4.  Patient will demonstrate circumferential and sequential contraction of >4/5 MMT, > 6 sec hold x10 and 5 consecutive quick flicks with </= 10 min rest between testing bouts, and relaxation of the PFM coordinated with breath for improved management of intra-abdominal pressure and normal bowel and bladder function without the presence of pain nor incontinence in order to improve participation at home and  in the community. Baseline: not formally assessed  Goal status: INITIAL   PLAN: PT FREQUENCY: 1x/week  PT DURATION: 12 weeks  PLANNED INTERVENTIONS: Therapeutic exercises, Therapeutic activity, Neuromuscular re-education, Balance training, Gait training, Patient/Family education, Joint mobilization, Orthotic/Fit training, Electrical stimulation, Spinal manipulation, Spinal mobilization, Cryotherapy, Moist heat, Taping, and Manual therapy  PLAN FOR NEXT SESSION: deep  core  Myles Gip PT, DPT [phone]  12/17/2021, 4:24 PM

## 2021-12-22 ENCOUNTER — Ambulatory Visit
Admission: RE | Admit: 2021-12-22 | Discharge: 2021-12-22 | Disposition: A | Discharge status: Home / Self Care | Payer: BC Managed Care – PPO | Source: Ambulatory Visit | Attending: Internal Medicine | Admitting: Internal Medicine

## 2021-12-22 ENCOUNTER — Encounter: Payer: Self-pay | Admitting: Internal Medicine

## 2021-12-22 ENCOUNTER — Ambulatory Visit: Payer: BC Managed Care – PPO | Admitting: Internal Medicine

## 2021-12-22 VITALS — BP 120/54 | HR 102 | Ht 72.0 in | Wt 220.0 lb

## 2021-12-22 DIAGNOSIS — M7989 Other specified soft tissue disorders: Secondary | ICD-10-CM

## 2021-12-22 NOTE — Progress Notes (Signed)
Date:  12/22/2021   Name:  Harold Carroll   DOB:  [birth year]   MRN:  [id number]   Chief Complaint: Leg Pain and Foot Swelling  Leg Pain  Incident onset: 3 days ago when standing up out of a chair. There was no injury mechanism. The pain is present in the right leg. The quality of the pain is described as shooting and cramping. The pain is at a severity of 8/10. The pain is severe. The pain has been Constant since onset. Associated symptoms include an inability to bear weight, numbness and tingling. Associated symptoms comments: edema. He reports no foreign bodies present.   Foot Swelling: Patient is complaining of Left foot/Ankle swelling for the last few days. Slightly painful when walking. Patient has a past history of surgery on this ankle.  Lab Results  Component Value Date   NA 136 07/13/2021   K 4.8 07/13/2021   CO2 24 07/13/2021   GLUCOSE 113 (H) 07/13/2021   BUN 15 07/13/2021   CREATININE 1.16 07/13/2021   CALCIUM 9.7 07/13/2021   EGFR 74 07/13/2021   GFRNONAA >60 01/07/2021   Lab Results  Component Value Date   CHOL 231 (H) 07/13/2021   HDL 46 07/13/2021   LDLCALC 141 (H) 07/13/2021   TRIG 244 (H) 07/13/2021   CHOLHDL 5.0 07/13/2021   Lab Results  Component Value Date   TSH 1.240 09/29/2015   Lab Results  Component Value Date   HGBA1C 5.6 09/29/2015   Lab Results  Component Value Date   WBC 7.7 12/14/2021   HGB 15.1 12/14/2021   HCT 43.7 12/14/2021   MCV 85.5 12/14/2021   PLT 269 12/14/2021   Lab Results  Component Value Date   ALT 30 07/13/2021   AST 30 07/13/2021   ALKPHOS 144 (H) 07/13/2021   BILITOT 0.8 07/13/2021   Lab Results  Component Value Date   VD25OH 30.0 09/29/2015     Review of Systems  Constitutional:  Negative for chills, fatigue and fever.  Respiratory:  Negative for cough, chest tightness and shortness of breath.   Cardiovascular:  Positive for leg swelling. Negative for chest pain.  Musculoskeletal:  Positive for  gait problem, joint swelling and myalgias.  Neurological:  Positive for tingling and numbness.   Patient Active Problem List   Diagnosis Date Noted   S/P laparoscopic colectomy 01/06/2021   Polyp of sigmoid colon    Polycythemia 02/19/2019   Mixed hyperlipidemia 10/02/2015   Overweight (BMI 25.0-29.9) 09/24/2015   Tobacco use disorder 09/24/2015   Panic disorder 09/24/2015   GERD (gastroesophageal reflux disease) 09/24/2015   Seasonal allergic rhinitis 09/24/2015    Allergies  Allergen Reactions   Septra [Sulfamethoxazole-Trimethoprim] Rash    Past Surgical History:  Procedure Laterality Date   COLONOSCOPY WITH PROPOFOL N/A 06/18/2019   Procedure: COLONOSCOPY WITH BIOPSY;  Surgeon: Lucilla Lame, MD;  Location: [place];  Service: Endoscopy;  Laterality: N/A;   HERNIA REPAIR     HOLEP-LASER ENUCLEATION OF THE PROSTATE WITH MORCELLATION N/A 09/21/2021   Procedure: HOLEP-LASER ENUCLEATION OF THE PROSTATE WITH MORCELLATION;  Surgeon: Hollice Espy, MD;  Location: [facility];  Service: Urology;  Laterality: N/A;   INGUINAL HERNIA REPAIR Left 10/28/2015   Procedure: HERNIA REPAIR INGUINAL ADULT;  Surgeon: Hubbard Robinson, MD;  Location: [facility];  Service: General;  Laterality: Left;   POLYPECTOMY  06/18/2019   Procedure: POLYPECTOMY;  Surgeon: Lucilla Lame, MD;  Location: [place];  Service: Endoscopy;;  ROBOT ASSISTED LAPAROSCOPIC PARTIAL COLECTOMY  2022   SKIN SURGERY     Tumor-benign   TONSILLECTOMY AND ADENOIDECTOMY     TUMOR REMOVAL     VENTRAL HERNIA REPAIR  1968   Dr. Mare Ferrari    Social History   Tobacco Use   Smoking status: Every Day    Packs/day: 1.50    Years: 30.00    Pack years: 45.00    Types: Cigarettes    Start date: 10/22/1983    Last attempt to quit: 10/08/2015    Years since quitting: 6.2   Smokeless tobacco: Never   Tobacco comments:    off and on since 1985  Vaping Use   Vaping Use: Never used  Substance Use Topics    Alcohol use: Yes    Alcohol/week: 9.0 standard drinks    Types: 1 Cans of beer, 8 Standard drinks or equivalent per week    Comment: pt has hardly any drinks during week and weekends drinks more mostly liquor and some beer   Drug use: Yes    Types: Marijuana    Comment: every now and then about 1 time month     Medication list has been reviewed and updated.  Current Meds  Medication Sig   albuterol (VENTOLIN HFA) 108 (90 Base) MCG/ACT inhaler Inhale 2 puffs into the lungs every 6 (six) hours as needed for wheezing or shortness of breath.   aspirin 81 MG EC tablet Take 81 mg by mouth daily.   fexofenadine (ALLEGRA) 180 MG tablet Take 180 mg by mouth daily.   fluticasone (FLONASE) 50 MCG/ACT nasal spray Place 2 sprays into both nostrils daily.   LORazepam (ATIVAN) 0.5 MG tablet Take 1 tablet (0.5 mg total) by mouth daily as needed for anxiety.   Multiple Vitamin (MULTIVITAMIN) capsule Take 1 capsule by mouth daily.   omeprazole (PRILOSEC) 10 MG capsule Take 10 mg by mouth daily.   oxybutynin (DITROPAN) 5 MG tablet Take 1 tablet (5 mg total) by mouth every 8 (eight) hours as needed for bladder spasms.   sildenafil (REVATIO) 20 MG tablet Take 1 tablet (20 mg total) by mouth as needed. Take 1-5 tabs as needed prior to intercourse   simvastatin (ZOCOR) 10 MG tablet Take 1 tablet (10 mg total) by mouth at bedtime.       12/22/2021   11:20 AM 07/13/2021    8:01 AM 09/22/2020    1:36 PM 07/04/2020    8:34 AM  GAD 7 : Generalized Anxiety Score  Nervous, Anxious, on Edge 0 0 0 0  Control/stop worrying 0 0 0 0  Worry too much - different things 0 0 0 0  Trouble relaxing 0 0 0 0  Restless 0 0 0 0  Easily annoyed or irritable 0 0 0 0  Afraid - awful might happen 0 0 0 0  Total GAD 7 Score 0 0 0 0  Anxiety Difficulty  Not difficult at all Not difficult at all        12/22/2021   11:20 AM  Depression screen PHQ 2/9  Decreased Interest 0  Down, Depressed, Hopeless 0  PHQ - 2 Score 0   Altered sleeping 0  Tired, decreased energy 0  Change in appetite 0  Feeling bad or failure about yourself  0  Trouble concentrating 0  Moving slowly or fidgety/restless 0  Suicidal thoughts 0  PHQ-9 Score 0  Difficult doing work/chores Not difficult at all    BP Readings from Last 3  Encounters:  12/22/21 (!) 120/54  12/14/21 124/60  11/18/21 124/88    Physical Exam Vitals and nursing note reviewed.  Constitutional:      General: He is not in acute distress.    Appearance: He is well-developed.  HENT:     Head: Normocephalic and atraumatic.  Cardiovascular:     Rate and Rhythm: Normal rate and regular rhythm.  Pulmonary:     Effort: Pulmonary effort is normal. No respiratory distress.     Breath sounds: No wheezing or rhonchi.  Musculoskeletal:     Right knee: Decreased range of motion.     Comments: Posterior knee fullness and discomfort, slightly warm. No cord palpated No redness noted  Skin:    General: Skin is warm and dry.     Findings: No rash.  Neurological:     Mental Status: He is alert and oriented to person, place, and time.  Psychiatric:        Mood and Affect: Mood normal.        Behavior: Behavior normal.    Wt Readings from Last 3 Encounters:  12/22/21 220 lb (99.8 kg)  12/14/21 222 lb (100.7 kg)  11/18/21 226 lb (102.5 kg)    BP (!) 120/54   Pulse (!) 102   Ht 6' (1.829 m)   Wt 220 lb (99.8 kg)   SpO2 97%   BMI 29.84 kg/m   Assessment and Plan: 1. Right leg swelling Acute pain upon standing three days ago Suspect Baker's cyst rupture but need to rule out DVT - [facility] to work him in now We will call him with the report and instructions - US Venous Img Lower Unilateral Right (DVT)   Partially dictated using Editor, commissioning. Any errors are unintentional.  Halina Maidens, MD [facility]  12/22/2021

## 2021-12-30 ENCOUNTER — Telehealth: Payer: Self-pay | Admitting: Internal Medicine

## 2021-12-30 NOTE — Telephone Encounter (Signed)
Copied from [place] [phone]. Topic: General - Other >> Dec 30, 2021  4:06 PM Bayard Beaver wrote: Reason for CRM:pt called in states specialist, Dr  Virginia Rochester zanette, pa-pasp wants him to have blood work done to rule out lime disease, before his appt on 06/28.  Please call back if this can be done,

## 2021-12-31 HISTORY — PX: AORTIC VALVE REPLACEMENT: SHX41

## 2022-01-01 ENCOUNTER — Ambulatory Visit (INDEPENDENT_AMBULATORY_CARE_PROVIDER_SITE_OTHER): Payer: BC Managed Care – PPO | Admitting: Internal Medicine

## 2022-01-01 ENCOUNTER — Ambulatory Visit
Admission: RE | Admit: 2022-01-01 | Discharge: 2022-01-01 | Disposition: A | Discharge status: Home / Self Care | Payer: BC Managed Care – PPO | Attending: Internal Medicine | Admitting: Internal Medicine

## 2022-01-01 ENCOUNTER — Encounter: Payer: Self-pay | Admitting: Internal Medicine

## 2022-01-01 ENCOUNTER — Ambulatory Visit
Admission: RE | Admit: 2022-01-01 | Discharge: 2022-01-01 | Disposition: A | Discharge status: Home / Self Care | Payer: BC Managed Care – PPO | Source: Ambulatory Visit | Attending: Internal Medicine | Admitting: Internal Medicine

## 2022-01-01 ENCOUNTER — Ambulatory Visit: Payer: BC Managed Care – PPO

## 2022-01-01 VITALS — BP 120/60 | HR 100 | Temp 97.5°F | Ht 72.0 in | Wt 219.0 lb

## 2022-01-01 DIAGNOSIS — M222X2 Patellofemoral disorders, left knee: Secondary | ICD-10-CM

## 2022-01-01 DIAGNOSIS — M2550 Pain in unspecified joint: Secondary | ICD-10-CM | POA: Diagnosis not present

## 2022-01-01 DIAGNOSIS — R062 Wheezing: Secondary | ICD-10-CM

## 2022-01-01 DIAGNOSIS — R200 Anesthesia of skin: Secondary | ICD-10-CM | POA: Diagnosis not present

## 2022-01-01 DIAGNOSIS — R61 Generalized hyperhidrosis: Secondary | ICD-10-CM

## 2022-01-01 DIAGNOSIS — M19072 Primary osteoarthritis, left ankle and foot: Secondary | ICD-10-CM | POA: Insufficient documentation

## 2022-01-01 DIAGNOSIS — R202 Paresthesia of skin: Secondary | ICD-10-CM

## 2022-01-01 DIAGNOSIS — M222X1 Patellofemoral disorders, right knee: Secondary | ICD-10-CM

## 2022-01-01 NOTE — Progress Notes (Signed)
Date:  01/01/2022   Name:  Harold Carroll   DOB:  1965-12-24   MRN:  [id number]   Chief Complaint: Night Sweats (Low grade fever, chills, feet swelling, wants lime test for ortho doctor, sweats when taking tylenol and ibuprofen, has to change clothes at night due to sweating, right foot numbness and tingling in toes, left big toe painful)  HPI Joint pain - he is having pain in both knees and swelling and pain in the left ankle.  Ortho thinks it is patellofemoral syndrome in the knees and OA in the left ankle.  He is now wearing a boot.  He also has pain and swelling in the left big toe which could be gout.  He started steroid taper yesterday and thinks it might be helping some.  Night sweats - severe sweats started several weeks ago after being treated for a scrotal infection.  The infection has resolved.  He thinks that taking tylenol or advil int he evening make the sweats worse.  He has slight cough and sinus drainage - he continues to smoke.  No hemoptysis or weight loss.  No known exposure to TB, no known tick bites.  Tingling/numbness - in the fore foot on the right mostly; some on the left.  No known trauma.  Xrays by Ortho unrevealing.  Wheezing - started over the past week - he has some sinus drainage but no production or sputum.  He continues to smoke.  Has not taken any antibiotics since April when he was treated for scrotal infection with Clindamycin.  Lab Results  Component Value Date   NA 136 07/13/2021   K 4.8 07/13/2021   CO2 24 07/13/2021   GLUCOSE 113 (H) 07/13/2021   BUN 15 07/13/2021   CREATININE 1.16 07/13/2021   CALCIUM 9.7 07/13/2021   EGFR 74 07/13/2021   GFRNONAA >60 01/07/2021   Lab Results  Component Value Date   CHOL 231 (H) 07/13/2021   HDL 46 07/13/2021   LDLCALC 141 (H) 07/13/2021   TRIG 244 (H) 07/13/2021   CHOLHDL 5.0 07/13/2021   Lab Results  Component Value Date   TSH 1.240 09/29/2015   Lab Results  Component Value Date   HGBA1C 5.6  09/29/2015   Lab Results  Component Value Date   WBC 7.7 12/14/2021   HGB 15.1 12/14/2021   HCT 43.7 12/14/2021   MCV 85.5 12/14/2021   PLT 269 12/14/2021   Lab Results  Component Value Date   ALT 30 07/13/2021   AST 30 07/13/2021   ALKPHOS 144 (H) 07/13/2021   BILITOT 0.8 07/13/2021   Lab Results  Component Value Date   VD25OH 30.0 09/29/2015     Review of Systems  Constitutional:  Positive for chills and diaphoresis. Negative for fatigue, fever and unexpected weight change.  Respiratory:  Positive for cough, shortness of breath and wheezing. Negative for chest tightness.   Cardiovascular:  Positive for leg swelling. Negative for chest pain and palpitations.  Genitourinary:        Urinary incontinence since prostate surgery  Musculoskeletal:  Positive for arthralgias, gait problem, joint swelling and myalgias.  Neurological:  Positive for numbness. Negative for dizziness, weakness, light-headedness and headaches.  Psychiatric/Behavioral:  Negative for dysphoric mood and sleep disturbance. The patient is not nervous/anxious.    Patient Active Problem List   Diagnosis Date Noted   Primary osteoarthritis of left ankle 01/01/2022   S/P laparoscopic colectomy 01/06/2021   Polyp of sigmoid colon  Polycythemia 02/19/2019   Mixed hyperlipidemia 10/02/2015   Overweight (BMI 25.0-29.9) 09/24/2015   Tobacco use disorder 09/24/2015   Panic disorder 09/24/2015   GERD (gastroesophageal reflux disease) 09/24/2015   Seasonal allergic rhinitis 09/24/2015    Allergies  Allergen Reactions   Septra [Sulfamethoxazole-Trimethoprim] Rash    Past Surgical History:  Procedure Laterality Date   COLONOSCOPY WITH PROPOFOL N/A 06/18/2019   Procedure: COLONOSCOPY WITH BIOPSY;  Surgeon: Lucilla Lame, MD;  Location: [place];  Service: Endoscopy;  Laterality: N/A;   HERNIA REPAIR     HOLEP-LASER ENUCLEATION OF THE PROSTATE WITH MORCELLATION N/A 09/21/2021   Procedure:  HOLEP-LASER ENUCLEATION OF THE PROSTATE WITH MORCELLATION;  Surgeon: Hollice Espy, MD;  Location: [facility];  Service: Urology;  Laterality: N/A;   INGUINAL HERNIA REPAIR Left 10/28/2015   Procedure: HERNIA REPAIR INGUINAL ADULT;  Surgeon: Hubbard Robinson, MD;  Location: [facility];  Service: General;  Laterality: Left;   POLYPECTOMY  06/18/2019   Procedure: POLYPECTOMY;  Surgeon: Lucilla Lame, MD;  Location: [place];  Service: Endoscopy;;   ROBOT ASSISTED LAPAROSCOPIC PARTIAL COLECTOMY  2022   SKIN SURGERY     Tumor-benign   TONSILLECTOMY AND ADENOIDECTOMY     TUMOR REMOVAL     VENTRAL HERNIA REPAIR  1968   Dr. Mare Ferrari    Social History   Tobacco Use   Smoking status: Every Day    Packs/day: 1.50    Years: 30.00    Pack years: 45.00    Types: Cigarettes    Start date: 10/22/1983    Last attempt to quit: 10/08/2015    Years since quitting: 6.2   Smokeless tobacco: Never   Tobacco comments:    off and on since 1985  Vaping Use   Vaping Use: Never used  Substance Use Topics   Alcohol use: Yes    Alcohol/week: 9.0 standard drinks    Types: 1 Cans of beer, 8 Standard drinks or equivalent per week    Comment: pt has hardly any drinks during week and weekends drinks more mostly liquor and some beer   Drug use: Yes    Types: Marijuana    Comment: every now and then about 1 time month     Medication list has been reviewed and updated.  Current Meds  Medication Sig   aspirin 81 MG EC tablet Take 81 mg by mouth daily.   Cetirizine HCl (ZYRTEC PO) Take by mouth.   fluticasone (FLONASE) 50 MCG/ACT nasal spray Place 2 sprays into both nostrils daily.   LORazepam (ATIVAN) 0.5 MG tablet Take 1 tablet (0.5 mg total) by mouth daily as needed for anxiety.   Multiple Vitamin (MULTIVITAMIN) capsule Take 1 capsule by mouth daily.   omeprazole (PRILOSEC) 10 MG capsule Take 10 mg by mouth daily.   oxybutynin (DITROPAN) 5 MG tablet Take 1 tablet (5 mg total) by mouth every 8  (eight) hours as needed for bladder spasms.   sildenafil (REVATIO) 20 MG tablet Take 1 tablet (20 mg total) by mouth as needed. Take 1-5 tabs as needed prior to intercourse   simvastatin (ZOCOR) 10 MG tablet Take 1 tablet (10 mg total) by mouth at bedtime.   [DISCONTINUED] albuterol (VENTOLIN HFA) 108 (90 Base) MCG/ACT inhaler Inhale 2 puffs into the lungs every 6 (six) hours as needed for wheezing or shortness of breath.       01/01/2022   11:13 AM 12/22/2021   11:20 AM 07/13/2021    8:01 AM 09/22/2020  1:36 PM  GAD 7 : Generalized Anxiety Score  Nervous, Anxious, on Edge 3 0 0 0  Control/stop worrying 0 0 0 0  Worry too much - different things 0 0 0 0  Trouble relaxing 0 0 0 0  Restless 0 0 0 0  Easily annoyed or irritable 0 0 0 0  Afraid - awful might happen 0 0 0 0  Total GAD 7 Score 3 0 0 0  Anxiety Difficulty Not difficult at all  Not difficult at all Not difficult at all       01/01/2022   11:13 AM  Depression screen PHQ 2/9  Decreased Interest 0  Down, Depressed, Hopeless 0  PHQ - 2 Score 0  Altered sleeping 0  Tired, decreased energy 0  Change in appetite 0  Feeling bad or failure about yourself  0  Trouble concentrating 0  Moving slowly or fidgety/restless 0  Suicidal thoughts 0  PHQ-9 Score 0  Difficult doing work/chores Not difficult at all    BP Readings from Last 3 Encounters:  01/01/22 120/60  12/22/21 (!) 120/54  12/14/21 124/60    Physical Exam Constitutional:      Appearance: Normal appearance. He is normal weight.  Neck:     Vascular: No carotid bruit.  Cardiovascular:     Rate and Rhythm: Normal rate and regular rhythm.     Pulses: Normal pulses.     Heart sounds: No murmur heard. Pulmonary:     Effort: Pulmonary effort is normal.     Breath sounds: Normal breath sounds. No wheezing or rhonchi.  Musculoskeletal:     Cervical back: Normal range of motion.     Right knee: No swelling. Abnormal patellar mobility.     Left knee: No swelling.  Abnormal patellar mobility.     Right ankle: Swelling present. Tenderness present. Decreased range of motion.     Left ankle: Swelling present. Tenderness present. Decreased range of motion.  Lymphadenopathy:     Cervical: No cervical adenopathy.  Neurological:     General: No focal deficit present.     Mental Status: He is alert.    Wt Readings from Last 3 Encounters:  01/01/22 219 lb (99.3 kg)  12/22/21 220 lb (99.8 kg)  12/14/21 222 lb (100.7 kg)    BP 120/60   Pulse 100   Temp (!) 97.5 F (36.4 C) (Oral)   Ht 6' (1.829 m)   Wt 219 lb (99.3 kg) Comment: pt has boot on let leg  SpO2 96%   BMI 29.70 kg/m   Assessment and Plan: 1. Arthralgia of multiple joints Recommend ruling out auto-immune pathology Continue steroid taper and close Ortho follow up. - ANA w/Reflex if Positive - Rheumatoid factor - Sedimentation rate - Uric acid - Lyme disease dna by pcr(borrelia burg)  2. Patellofemoral pain syndrome of both knees  3. Chronic night sweats Rule out TB, other metabolic abnormality - CBC with Differential/Platelet - Comprehensive metabolic panel - QuantiFERON-TB Gold Plus  4. Numbness and tingling of both feet Of unclear etiology - Vitamin B12 - TSH  5. Primary osteoarthritis of left ankle Continue boot for support per Ortho  6. Wheezing Concern for chest infection - will hold off on antibiotics until labs return - DG Chest 2 View   Partially dictated using Dragon software. Any errors are unintentional.  Halina Maidens, MD [facility]  01/01/2022

## 2022-01-05 ENCOUNTER — Other Ambulatory Visit: Payer: Self-pay

## 2022-01-05 ENCOUNTER — Encounter: Payer: Self-pay | Admitting: Internal Medicine

## 2022-01-05 MED ORDER — DOXYCYCLINE HYCLATE 100 MG PO TABS
100.0000 mg | ORAL_TABLET | Freq: Two times a day (BID) | ORAL | 0 refills | Status: AC
Start: 2022-01-05 — End: 2022-01-19

## 2022-01-06 ENCOUNTER — Telehealth: Payer: Self-pay | Admitting: Internal Medicine

## 2022-01-06 ENCOUNTER — Other Ambulatory Visit: Payer: Self-pay

## 2022-01-06 DIAGNOSIS — R768 Other specified abnormal immunological findings in serum: Secondary | ICD-10-CM

## 2022-01-06 LAB — CBC WITH DIFFERENTIAL/PLATELET
Basophils Absolute: 0 10*3/uL (ref 0.0–0.2)
Basos: 0 %
EOS (ABSOLUTE): 0 10*3/uL (ref 0.0–0.4)
Eos: 0 %
Hematocrit: 42.6 % (ref 37.5–51.0)
Hemoglobin: 14.4 g/dL (ref 13.0–17.7)
Immature Grans (Abs): 0.1 10*3/uL (ref 0.0–0.1)
Immature Granulocytes: 1 %
Lymphocytes Absolute: 1.7 10*3/uL (ref 0.7–3.1)
Lymphs: 14 %
MCH: 28.6 pg (ref 26.6–33.0)
MCHC: 33.8 g/dL (ref 31.5–35.7)
MCV: 85 fL (ref 79–97)
Monocytes Absolute: 0.7 10*3/uL (ref 0.1–0.9)
Monocytes: 6 %
Neutrophils Absolute: 9.7 10*3/uL — ABNORMAL HIGH (ref 1.4–7.0)
Neutrophils: 79 %
Platelets: 300 10*3/uL (ref 150–450)
RBC: 5.03 x10E6/uL (ref 4.14–5.80)
RDW: 12.5 % (ref 11.6–15.4)
WBC: 12.2 10*3/uL — ABNORMAL HIGH (ref 3.4–10.8)

## 2022-01-06 LAB — COMPREHENSIVE METABOLIC PANEL
ALT: 27 IU/L (ref 0–44)
AST: 18 IU/L (ref 0–40)
Albumin/Globulin Ratio: 1.5 (ref 1.2–2.2)
Albumin: 3.9 g/dL (ref 3.8–4.9)
Alkaline Phosphatase: 174 IU/L — ABNORMAL HIGH (ref 44–121)
BUN/Creatinine Ratio: 16 (ref 9–20)
BUN: 15 mg/dL (ref 6–24)
Bilirubin Total: 0.4 mg/dL (ref 0.0–1.2)
CO2: 24 mmol/L (ref 20–29)
Calcium: 9.3 mg/dL (ref 8.7–10.2)
Chloride: 99 mmol/L (ref 96–106)
Creatinine, Ser: 0.91 mg/dL (ref 0.76–1.27)
Globulin, Total: 2.6 g/dL (ref 1.5–4.5)
Glucose: 104 mg/dL — ABNORMAL HIGH (ref 70–99)
Potassium: 4.3 mmol/L (ref 3.5–5.2)
Sodium: 138 mmol/L (ref 134–144)
Total Protein: 6.5 g/dL (ref 6.0–8.5)
eGFR: 100 mL/min/{1.73_m2} (ref >59)

## 2022-01-06 LAB — QUANTIFERON-TB GOLD PLUS
QuantiFERON Mitogen Value: 4.31 IU/mL
QuantiFERON Nil Value: 0.01 IU/mL
QuantiFERON TB1 Ag Value: 0.01 IU/mL
QuantiFERON TB2 Ag Value: 0.01 IU/mL
QuantiFERON-TB Gold Plus: NEGATIVE

## 2022-01-06 LAB — SEDIMENTATION RATE: Sed Rate: 7 mm/hr (ref 0–30)

## 2022-01-06 LAB — URIC ACID: Uric Acid: 3.9 mg/dL (ref 3.8–8.4)

## 2022-01-06 LAB — RHEUMATOID FACTOR: Rheumatoid fact SerPl-aCnc: 16.9 IU/mL — ABNORMAL HIGH (ref <14.0)

## 2022-01-06 LAB — ANA W/REFLEX IF POSITIVE: Anti Nuclear Antibody (ANA): NEGATIVE

## 2022-01-06 LAB — VITAMIN B12: Vitamin B-12: 348 pg/mL (ref 232–1245)

## 2022-01-06 LAB — TSH: TSH: 1.22 u[IU]/mL (ref 0.450–4.500)

## 2022-01-06 LAB — LYME DISEASE DNA BY PCR(BORRELIA BURG): Lyme (B. burgdorferi) PCR: NEGATIVE

## 2022-01-06 NOTE — Telephone Encounter (Signed)
Pt requests that his call be returned after 4 pm to go over lab results. Cb# [phone]

## 2022-01-06 NOTE — Telephone Encounter (Signed)
Called and left VM about lab results.

## 2022-01-08 ENCOUNTER — Ambulatory Visit: Payer: BC Managed Care – PPO | Admitting: Internal Medicine

## 2022-01-08 ENCOUNTER — Telehealth: Payer: Self-pay

## 2022-01-08 NOTE — Telephone Encounter (Signed)
Pt. States he received a message he had an appointment today. Asking why he needs to be seen. Levada Dy in the practice states if pt. Feels better, he does not need to be seen. Pt. Requests to cancel appointment today.

## 2022-01-21 ENCOUNTER — Encounter: Payer: Self-pay | Admitting: Physical Therapy

## 2022-01-27 ENCOUNTER — Ambulatory Visit: Payer: BC Managed Care – PPO | Admitting: Internal Medicine

## 2022-02-17 ENCOUNTER — Other Ambulatory Visit (INDEPENDENT_AMBULATORY_CARE_PROVIDER_SITE_OTHER): Payer: BC Managed Care – PPO | Admitting: Internal Medicine

## 2022-02-17 DIAGNOSIS — L03116 Cellulitis of left lower limb: Secondary | ICD-10-CM

## 2022-02-17 DIAGNOSIS — F410 Panic disorder [episodic paroxysmal anxiety] without agoraphobia: Secondary | ICD-10-CM

## 2022-02-17 DIAGNOSIS — R739 Hyperglycemia, unspecified: Secondary | ICD-10-CM

## 2022-02-17 DIAGNOSIS — Z9181 History of falling: Secondary | ICD-10-CM

## 2022-02-17 DIAGNOSIS — F17210 Nicotine dependence, cigarettes, uncomplicated: Secondary | ICD-10-CM

## 2022-02-17 DIAGNOSIS — A498 Other bacterial infections of unspecified site: Secondary | ICD-10-CM

## 2022-02-17 DIAGNOSIS — R1319 Other dysphagia: Secondary | ICD-10-CM

## 2022-02-17 DIAGNOSIS — Z95810 Presence of automatic (implantable) cardiac defibrillator: Secondary | ICD-10-CM

## 2022-02-17 DIAGNOSIS — Z452 Encounter for adjustment and management of vascular access device: Secondary | ICD-10-CM

## 2022-02-17 DIAGNOSIS — Z7982 Long term (current) use of aspirin: Secondary | ICD-10-CM

## 2022-02-17 DIAGNOSIS — Z953 Presence of xenogenic heart valve: Secondary | ICD-10-CM

## 2022-02-17 DIAGNOSIS — I10 Essential (primary) hypertension: Secondary | ICD-10-CM

## 2022-02-17 DIAGNOSIS — R7881 Bacteremia: Secondary | ICD-10-CM

## 2022-02-17 DIAGNOSIS — B952 Enterococcus as the cause of diseases classified elsewhere: Secondary | ICD-10-CM

## 2022-02-17 DIAGNOSIS — Z9049 Acquired absence of other specified parts of digestive tract: Secondary | ICD-10-CM

## 2022-02-17 DIAGNOSIS — I330 Acute and subacute infective endocarditis: Secondary | ICD-10-CM

## 2022-02-17 DIAGNOSIS — Z48812 Encounter for surgical aftercare following surgery on the circulatory system: Secondary | ICD-10-CM

## 2022-02-17 DIAGNOSIS — Z792 Long term (current) use of antibiotics: Secondary | ICD-10-CM

## 2022-02-17 DIAGNOSIS — E782 Mixed hyperlipidemia: Secondary | ICD-10-CM

## 2022-02-17 NOTE — Progress Notes (Signed)
Received home health orders orders from [facility]/22 Home [facility]. Start of care 01/27/22.   Certification and orders from 01/27/22 through 03/27/22 are reviewed, signed and faxed back to home health company.  Need of intermittent skilled services at home: homebound  The home health care plan has been established by me and will be reviewed and updated as needed to maximize patient recovery.  I certify that all home health services have been and will be furnished to the patient while under my care.  Face-to-face encounter in which the need for home health services was established: 01/26/22 at hospital discharge.  Patient is receiving home health services for the following diagnoses: Problem List Items Addressed This Visit       Cardiovascular and Mediastinum   Benign hypertension     Other   Mixed hyperlipidemia   Panic disorder   Presence of automatic cardioverter/defibrillator (AICD)   Status post aortic valve replacement with bioprosthetic valve   Other Visit Diagnoses     Aftercare following surgery of the circulatory system    -  Primary   Acute and subacute bacterial endocarditis       Enterococcus faecalis infection       Bacteremia       Cellulitis of left foot       Esophageal dysphagia       Cigarette smoker       Blood glucose elevated       Acquired absence of large intestine       Fitting and adjustment of vascular catheter       Encounter for long-term (current) use of antibiotics       Encounter for long-term (current) use of aspirin       Personal history of fall            Halina Maidens, MD

## 2022-05-04 ENCOUNTER — Ambulatory Visit: Payer: BC Managed Care – PPO | Admitting: Urology

## 2022-05-12 ENCOUNTER — Ambulatory Visit: Payer: BC Managed Care – PPO | Admitting: Urology

## 2022-06-16 ENCOUNTER — Ambulatory Visit (INDEPENDENT_AMBULATORY_CARE_PROVIDER_SITE_OTHER): Payer: BC Managed Care – PPO | Admitting: Urology

## 2022-06-16 ENCOUNTER — Encounter: Payer: Self-pay | Admitting: Urology

## 2022-06-16 ENCOUNTER — Inpatient Hospital Stay: Payer: BC Managed Care – PPO | Attending: Oncology

## 2022-06-16 VITALS — Ht 72.0 in | Wt 219.0 lb

## 2022-06-16 DIAGNOSIS — N138 Other obstructive and reflux uropathy: Secondary | ICD-10-CM | POA: Diagnosis not present

## 2022-06-16 DIAGNOSIS — N401 Enlarged prostate with lower urinary tract symptoms: Secondary | ICD-10-CM | POA: Diagnosis not present

## 2022-06-16 DIAGNOSIS — N5203 Combined arterial insufficiency and corporo-venous occlusive erectile dysfunction: Secondary | ICD-10-CM | POA: Diagnosis not present

## 2022-06-16 DIAGNOSIS — D751 Secondary polycythemia: Secondary | ICD-10-CM | POA: Diagnosis present

## 2022-06-16 DIAGNOSIS — N393 Stress incontinence (female) (male): Secondary | ICD-10-CM

## 2022-06-16 LAB — CBC WITH DIFFERENTIAL/PLATELET
Abs Immature Granulocytes: 0.03 10*3/uL (ref 0.00–0.07)
Basophils Absolute: 0.1 10*3/uL (ref 0.0–0.1)
Basophils Relative: 1 %
Eosinophils Absolute: 0.5 10*3/uL (ref 0.0–0.5)
Eosinophils Relative: 5 %
HCT: 49.2 % (ref 39.0–52.0)
Hemoglobin: 16.4 g/dL (ref 13.0–17.0)
Immature Granulocytes: 0 %
Lymphocytes Relative: 24 %
Lymphs Abs: 2.4 10*3/uL (ref 0.7–4.0)
MCH: 28.3 pg (ref 26.0–34.0)
MCHC: 33.3 g/dL (ref 30.0–36.0)
MCV: 85 fL (ref 80.0–100.0)
Monocytes Absolute: 0.6 10*3/uL (ref 0.1–1.0)
Monocytes Relative: 6 %
Neutro Abs: 6.2 10*3/uL (ref 1.7–7.7)
Neutrophils Relative %: 64 %
Platelets: 262 10*3/uL (ref 150–400)
RBC: 5.79 MIL/uL (ref 4.22–5.81)
RDW: 17.1 % — ABNORMAL HIGH (ref 11.5–15.5)
WBC: 9.8 10*3/uL (ref 4.0–10.5)
nRBC: 0 % (ref 0.0–0.2)

## 2022-06-16 LAB — BLADDER SCAN AMB NON-IMAGING

## 2022-06-16 MED ORDER — SILDENAFIL CITRATE 20 MG PO TABS: 20.0000 mg | ORAL_TABLET | ORAL | 11 refills | Status: AC | PRN

## 2022-06-16 NOTE — Progress Notes (Signed)
06/16/2022 1:25 PM   Harold Carroll 06-20-1966 [id number]  Referring provider: Glean Hess, MD [address] [place],  [place] [address]  Chief Complaint  Patient presents with   Benign Prostatic Hypertrophy    HPI: 56 year old male with a personal history of BPH with chronic outlet obstruction status post HoLEP returns today for follow-up.  He underwent HoLEP in 10/2021 for significant primarily obstructive urinary symptoms.  Procedure was complicated by penile scrotal cellulitis which resolved with antibiotics.  He is now off all BPH medications as well as anticholinergic prescribed postoperatively.  He reports that he is voiding extremely well with a good stream, emptying well with a PVR of 0.  IPSS as below.  He still has some mild stress urinary incontinence, wears about 2 pads per day but these were not saturated.  He leaks with lifting heavy objects laughing and sneezing.  He is dry at night.  He was making strides forward with his incontinence but had to stop pelvic floor rehab because he developed endocarditis and ended up having to undergo valve repair.  He is finally recovered from this and doing well.  He started back doing his pelvic floor exercises recently.  He also mentions today that he is developed erectile dysfunction.  He is not on any nitrates and does not think that sildenafil would be contraindicated.  He is having some difficulty maintaining and achieving his erections.  Results for orders placed or performed in visit on 06/16/22  Bladder Scan (Post Void Residual) in office  Result Value Ref Range   Scan Result 0ML       IPSS     Row Name 06/16/22 1000         International Prostate Symptom Score   How often have you had the sensation of not emptying your bladder? Not at All     How often have you had to urinate less than every two hours? Less than 1 in 5 times     How often have you found you stopped and started again several  times when you urinated? Not at All     How often have you found it difficult to postpone urination? Not at All     How often have you had a weak urinary stream? Not at All     How often have you had to strain to start urination? Not at All     How many times did you typically get up at night to urinate? 1 Time     Total IPSS Score 2       Quality of Life due to urinary symptoms   If you were to spend the rest of your life with your urinary condition just the way it is now how would you feel about that? Mostly Satisfied              Score:  1-7 Mild 8-19 Moderate 20-35 Severe    PMH: Past Medical History:  Diagnosis Date   Endocarditis    GERD (gastroesophageal reflux disease)    Panic attacks    Pneumonia    2018   Polycythemia     Surgical History: Past Surgical History:  Procedure Laterality Date   AORTIC VALVE REPLACEMENT  12/2021   Done at [facility]   COLONOSCOPY WITH PROPOFOL N/A 06/18/2019   Procedure: COLONOSCOPY WITH BIOPSY;  Surgeon: Lucilla Lame, MD;  Location: [place];  Service: Endoscopy;  Laterality: N/A;   HERNIA REPAIR  HOLEP-LASER ENUCLEATION OF THE PROSTATE WITH MORCELLATION N/A 09/21/2021   Procedure: HOLEP-LASER ENUCLEATION OF THE PROSTATE WITH MORCELLATION;  Surgeon: Hollice Espy, MD;  Location: [facility];  Service: Urology;  Laterality: N/A;   INGUINAL HERNIA REPAIR Left 10/28/2015   Procedure: HERNIA REPAIR INGUINAL ADULT;  Surgeon: Hubbard Robinson, MD;  Location: [facility];  Service: General;  Laterality: Left;   POLYPECTOMY  06/18/2019   Procedure: POLYPECTOMY;  Surgeon: Lucilla Lame, MD;  Location: [place];  Service: Endoscopy;;   ROBOT ASSISTED LAPAROSCOPIC PARTIAL COLECTOMY  2022   SKIN SURGERY     Tumor-benign   TONSILLECTOMY AND ADENOIDECTOMY     TUMOR REMOVAL     VENTRAL HERNIA REPAIR  1968   Dr. Mare Ferrari    Home Medications:  Allergies as of 06/16/2022       Reactions   Septra  [sulfamethoxazole-trimethoprim] Rash        Medication List        Accurate as of June 16, 2022  1:25 PM. If you have any questions, ask your nurse or doctor.          STOP taking these medications    oxybutynin 5 MG tablet Commonly known as: DITROPAN Stopped by: Hollice Espy, MD       TAKE these medications    aspirin EC 81 MG tablet Take 81 mg by mouth daily.   atorvastatin 80 MG tablet Commonly known as: LIPITOR Take 1 tablet by mouth daily.   fluticasone 50 MCG/ACT nasal spray Commonly known as: FLONASE Place 2 sprays into both nostrils daily.   furosemide 40 MG tablet Commonly known as: LASIX Take 20 mg by mouth daily.   LORazepam 0.5 MG tablet Commonly known as: ATIVAN Take 1 tablet (0.5 mg total) by mouth daily as needed for anxiety.   MAGnesium-Oxide 400 (240 Mg) MG tablet Generic drug: magnesium oxide Take 1 tablet by mouth daily.   methylPREDNISolone 4 MG Tbpk tablet Commonly known as: MEDROL DOSEPAK Take by mouth.   metoprolol succinate 50 MG 24 hr tablet Commonly known as: TOPROL-XL Take by mouth.   multivitamin capsule Take 1 capsule by mouth daily.   mupirocin ointment 2 % Commonly known as: BACTROBAN Apply topically.   omeprazole 10 MG capsule Commonly known as: PRILOSEC Take 10 mg by mouth daily.   sildenafil 20 MG tablet Commonly known as: Revatio Take 1 tablet (20 mg total) by mouth as needed. Take 1-5 tabs as needed prior to intercourse   simvastatin 10 MG tablet Commonly known as: ZOCOR Take 1 tablet (10 mg total) by mouth at bedtime.   spironolactone 25 MG tablet Commonly known as: ALDACTONE Take 1 tablet by mouth daily.   tamsulosin 0.4 MG Caps capsule Commonly known as: FLOMAX tamsulosin 0.4 mg capsule  TAKE 1 CAPSULE BY MOUTH ONCE DAILY   ZYRTEC PO Take by mouth.        Allergies:  Allergies  Allergen Reactions   Septra [Sulfamethoxazole-Trimethoprim] Rash    Family History: Family History   Problem Relation Age of Onset   Thyroid disease Mother    Heart disease Father        heart attack   Diabetes Father    Lung cancer Maternal Aunt    Cancer Maternal Aunt        lung cancer   Heart disease Paternal Uncle    Cancer Maternal Grandfather        lung cancer   Prostate cancer Brother 23   Lupus  Brother    Prostate cancer Maternal Uncle     Social History:  reports that he has been smoking cigarettes. He started smoking about 38 years ago. He has a 45.00 pack-year smoking history. He has never used smokeless tobacco. He reports current alcohol use of about 9.0 standard drinks of alcohol per week. He reports current drug use. Drug: Marijuana.   Physical Exam: Ht 6' (1.829 m)   Wt 219 lb (99.3 kg)   BMI 29.70 kg/m   Constitutional:  Alert and oriented, No acute distress. HEENT: [place] AT, moist mucus membranes.  Trachea midline, no masses. Cardiovascular: No clubbing, cyanosis, or edema. Respiratory: Normal respiratory effort, no increased work of breathing. Neurologic: Grossly intact, no focal deficits, moving all 4 extremities. Psychiatric: Normal mood and affect.  Laboratory Data: Lab Results  Component Value Date   WBC 9.8 06/16/2022   HGB 16.4 06/16/2022   HCT 49.2 06/16/2022   MCV 85.0 06/16/2022   PLT 262 06/16/2022    Lab Results  Component Value Date   CREATININE 0.91 01/01/2022     Lab Results  Component Value Date   HGBA1C 5.6 09/29/2015    Assessment & Plan:    1. Benign prostatic hyperplasia with urinary obstruction Status post holep with resolution of his both irritative and obstructive urinary symptoms on no BPH medication  PSA again along with symptoms recheck next year for new baseline - Bladder Scan (Post Void Residual) in office  2. Stress incontinence (male) (male) Should improve with pelvic floor exercises, encouraged to resume these  He was offered referral back to PT, let us know if he like to pursue this  3. Combined  arterial insufficiency and corporo-venous occlusive erectile dysfunction We discussed sildenafil therapy today, titrate up to 100 mg as needed.  Past possible side effects discussed.   Return in about 1 year (around 06/17/2023) for PSA/ DRE, IPSS/ PVR.  Hollice Espy, MD  [facility] [address], [place], [place] [address] [phone]

## 2022-07-14 ENCOUNTER — Encounter: Payer: BC Managed Care – PPO | Admitting: Internal Medicine

## 2022-08-26 ENCOUNTER — Telehealth: Payer: Self-pay | Admitting: *Deleted

## 2022-08-26 NOTE — Telephone Encounter (Signed)
Please schedule visit to discuss.    He had this medication for post op but wasn't having urgency when I last saw him.  Need to r/o underlying issues.  PA is ok.  Hollice Espy, MD

## 2022-08-26 NOTE — Telephone Encounter (Signed)
Pt calling asking if he can start back taking Oxybutynin? For bladder spasms?  Pt states he had heart surgery at [facility] and wanted to know if Oxybutynin was ok to take with his heart meds?  Last OV 06/2022  Pt aware you are out of office

## 2022-08-27 NOTE — Telephone Encounter (Signed)
Patient advised. He will check with his work and see when he can come

## 2022-10-12 IMAGING — CT CT ABD-PELV W/ CM
2 of 7 series · 14 of 46 positions shown, 16 images · IV contrast (agent unspecified)
Comparison: Comparison is made with December 09, 2020.

CLINICAL DATA: w 55-year-old male presents with penile swelling and
pain.

EXAM:
CT ABDOMEN AND PELVIS WITH CONTRAST
TECHNIQUE: Multidetector CT imaging of the abdomen and pelvis was performed
using the standard protocol following bolus administration of
intravenous contrast.

[Series 2: abd pelvis 5.00 · axial · 0.79mm/px · z∈[-1603,-1098]mm · 11 of 117 slices shown, 13 images]
[im 8/117  soft-tissue]
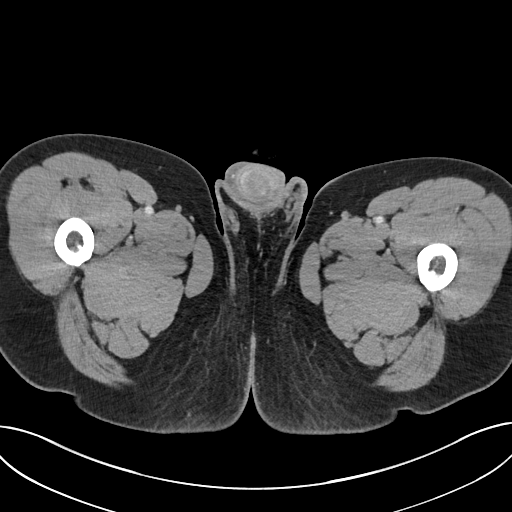
[im 8/117  bone]
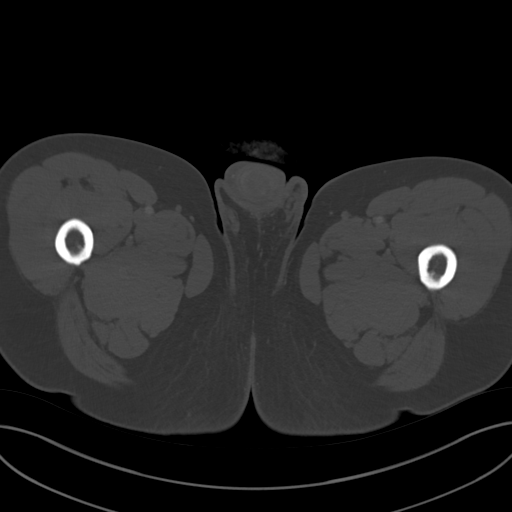
[im 16/117  soft-tissue]
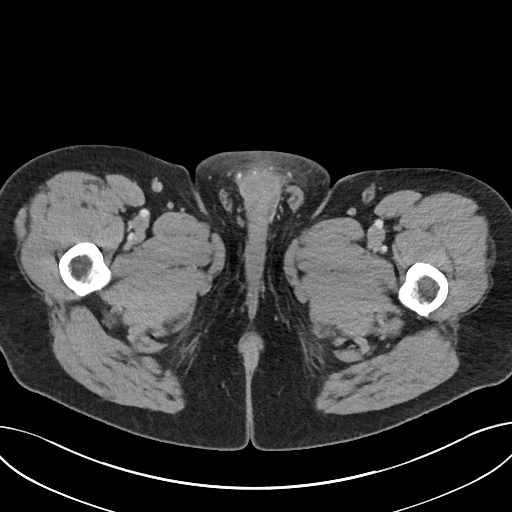
[im 31/117  soft-tissue]
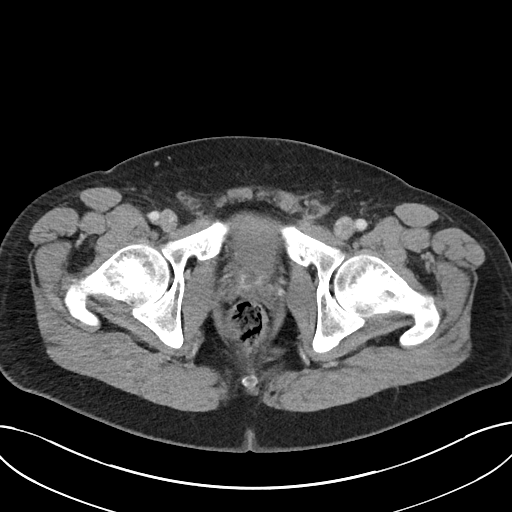
[im 39/117  soft-tissue]
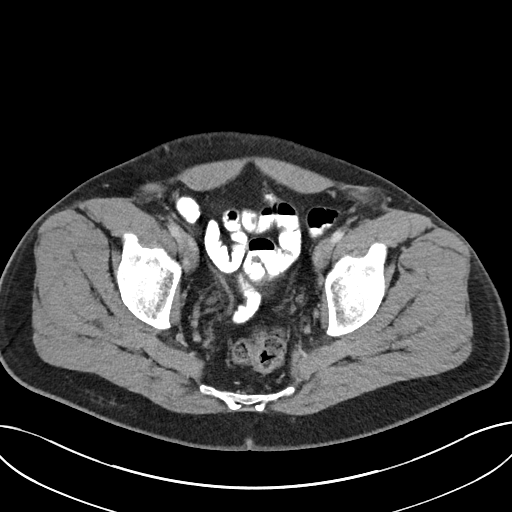
[im 47/117  soft-tissue]
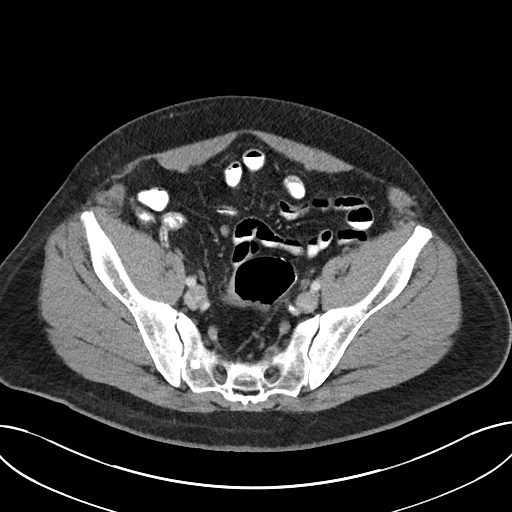
[im 62/117  soft-tissue]
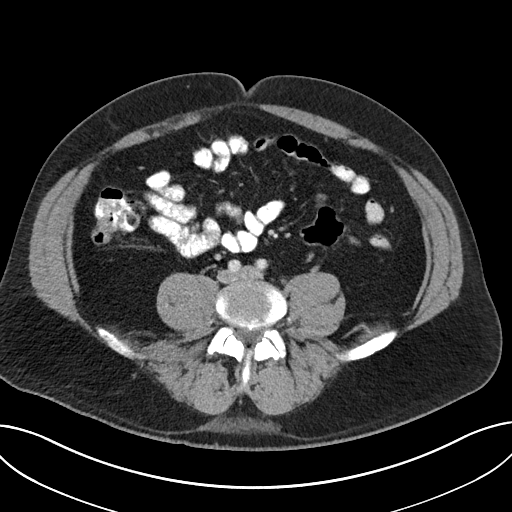
[im 70/117  soft-tissue]
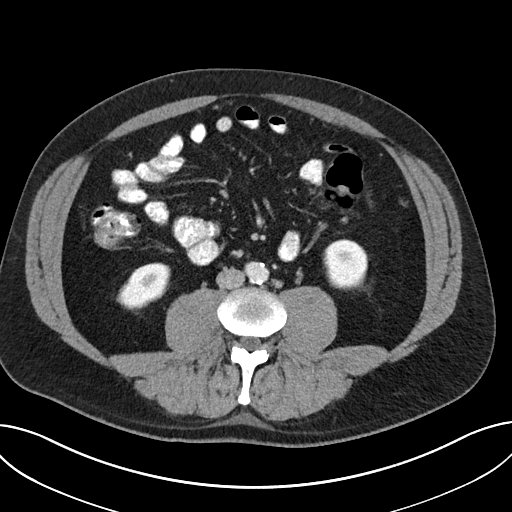
[im 78/117  soft-tissue]
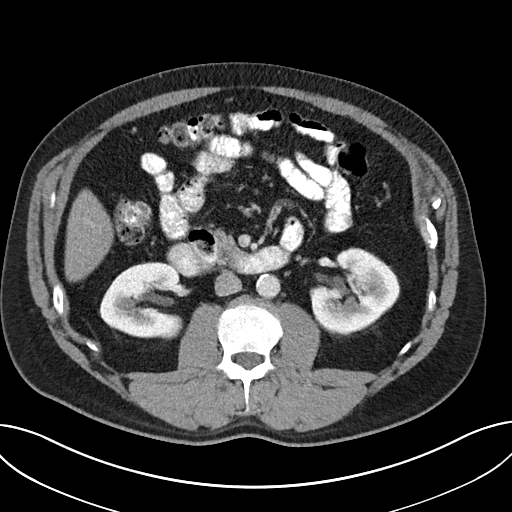
[im 86/117  soft-tissue]
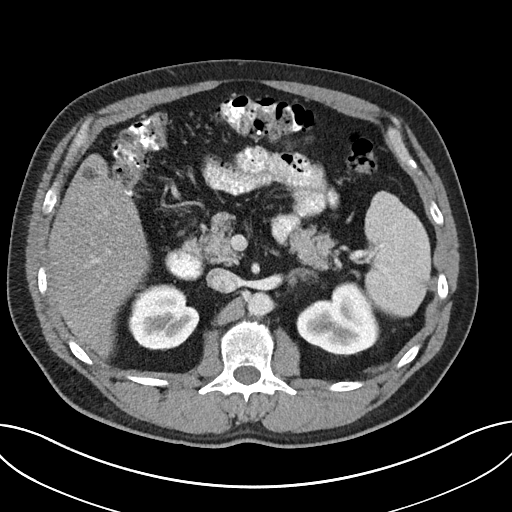
[im 86/117  bone]
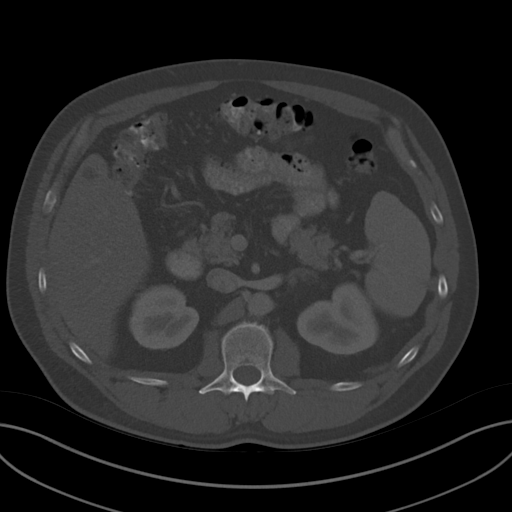
[im 101/117  soft-tissue]
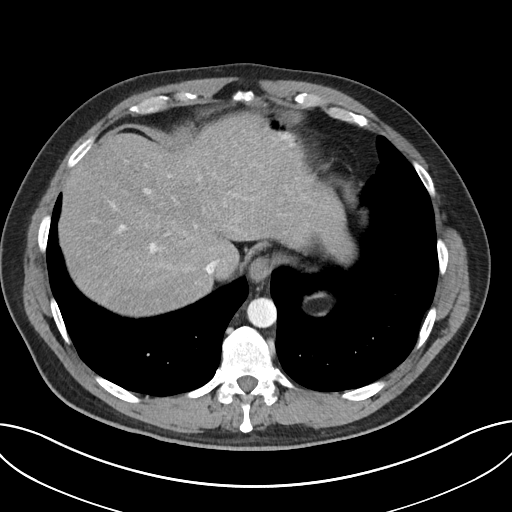
[im 109/117  soft-tissue]
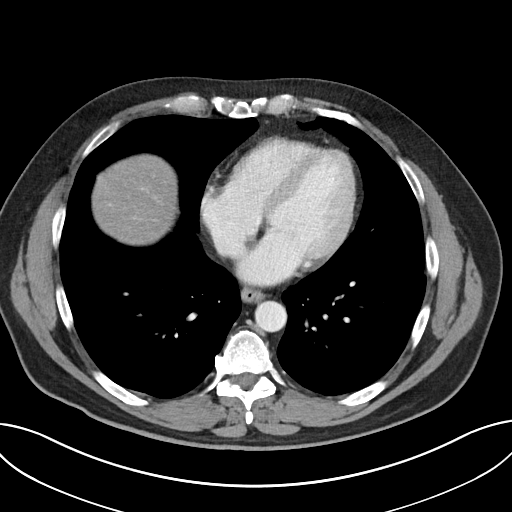

[Series 4: coronals abd pelvis 2.00 cor · coronal · 0.79mm/px · 3 of 158 slices shown]
[im 40/158  soft-tissue]
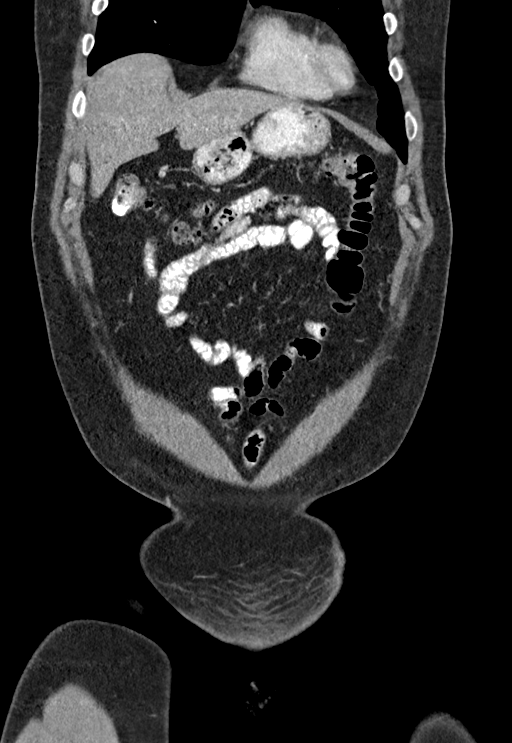
[im 79/158  soft-tissue]
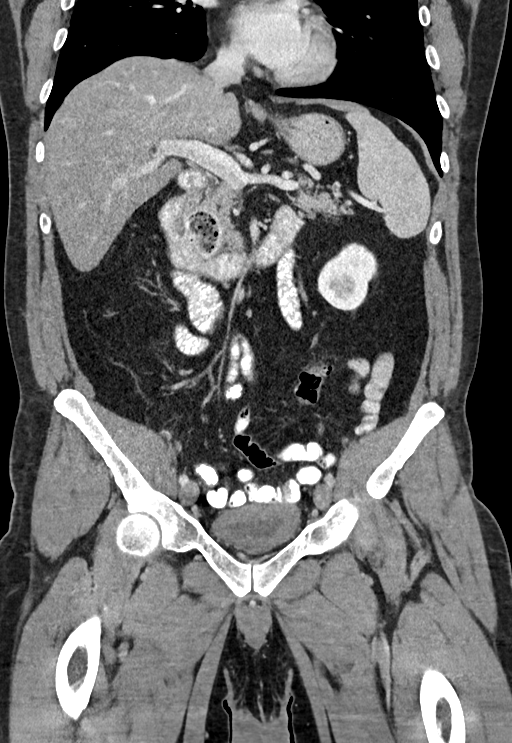
[im 118/158  soft-tissue]
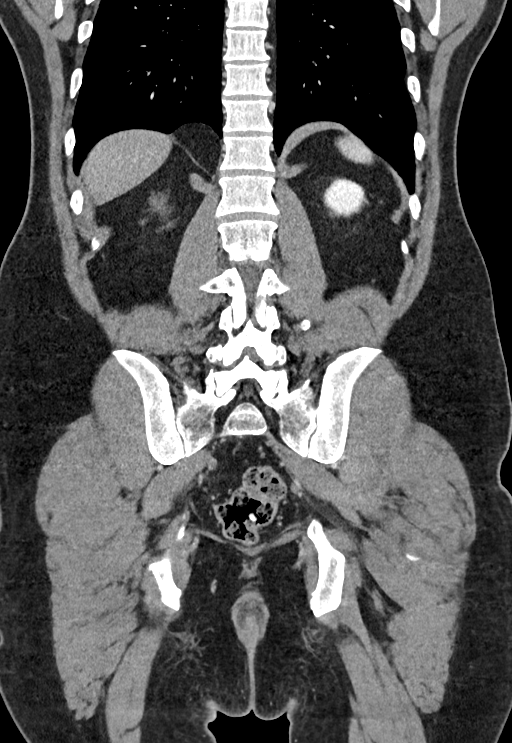

[14 of 46 positions shown; findings below may reference images not displayed]

RADIATION DOSE REDUCTION: This exam was performed according to the
departmental dose-optimization program which includes automated
exposure control, adjustment of the mA and/or kV according to
patient size and/or use of iterative reconstruction technique.

CONTRAST:  100mL OMNIPAQUE IOHEXOL 300 MG/ML  SOLN
FINDINGS: Lower chest: Lung bases are clear. No effusion. No consolidation. No
pericardial fluid. Heart is incompletely imaged.

Hepatobiliary: Hepatic steatosis with stable cyst in the RIGHT
hepatic lobe. No focal, suspicious hepatic lesion. The portal vein
is patent. No pericholecystic stranding or biliary duct distension.

Pancreas: Normal, without mass, inflammation or ductal dilatation.

Spleen: Normal.

Adrenals/Urinary Tract: Well-circumscribed LEFT adrenal nodule at 12
mm is unchanged dating back to Wednesday September, 2020 previously
characterized as a benign adenoma. Adrenal glands are otherwise
unremarkable. No follow-up recommended for these findings in the
absence of clinical signs of endocrinopathy

Symmetric renal enhancement. No hydronephrosis. No perinephric
stranding. Small bladder diverticulum along the LEFT posterolateral
urinary bladder. Urinary bladder wall thickening. Enhancement at the
bladder base extending into the patient's TURP defect.

Stomach/Bowel: Moderately large periampullary duodenal diverticulum
is unchanged at 3.1 x 2.2 cm. No acute gastrointestinal findings.
Appendix is normal. Post partial colonic resection with anastomosis
in the pelvis.

Vascular/Lymphatic:

Aortic atherosclerosis. No sign of aneurysm. Smooth contour of the
IVC. There is no gastrohepatic or hepatoduodenal ligament
lymphadenopathy. No retroperitoneal or mesenteric lymphadenopathy.

No pelvic sidewall lymphadenopathy.

Reproductive: Prostate with signs of HOLEP defect with some
peripheral enhancement of this area.

Diffuse expansion of, edema and stranding about the penis tracking
into the mons pubis and to a lesser extent into the adjacent
scrotum. The patient does not appear to be circumcised in there is
edema tracking into the prepuce. No gas within the soft tissues. No
focal lesion on CT.

Other: No ascites. No pneumoperitoneum. Small Bochdalek's hernia on
the RIGHT containing fat extending into the chest.

Musculoskeletal: No acute bone finding. No destructive bone process.
Spinal degenerative changes.

Excretory phase: Mild distension of the urinary bladder filling with
contrast. No unexpected collections on passive excretion based
imaging beyond the bladder. Defect in the prostate. Delayed imaging
extends only through the symphysis pubis.
IMPRESSION: Diffuse inflammation and edema involving the penis but with relative
sparing of the penile crura and pelvic floor, confined more to the
shaft of the penis and the prepuce. Also associated with presumed
cellulitis tracking into the scrotum and the mons pubis, suspicious
for diffuse sequela of infection and not associated with soft tissue
gas at the current time. Imaging does not extend entirely through
the scrotum but edema is less pronounced in the scrotum than in the
penis.

Changes of HOLEP related defect in the prostate with diffuse bladder
wall thickening, correlate with concomitant cystitis. Also with
enhancement of the defect which is nonspecific in this recent
postoperative patient.

Hepatic steatosis.

.

## 2022-12-08 IMAGING — CR DG CHEST 2V
3 series · 3 of 3 positions shown · non-contrast
Comparison: 09/24/2015

CLINICAL DATA: wheeze, cough, night sweats

EXAM:
CHEST - 2 VIEW

[chest pa (1 of 2)]
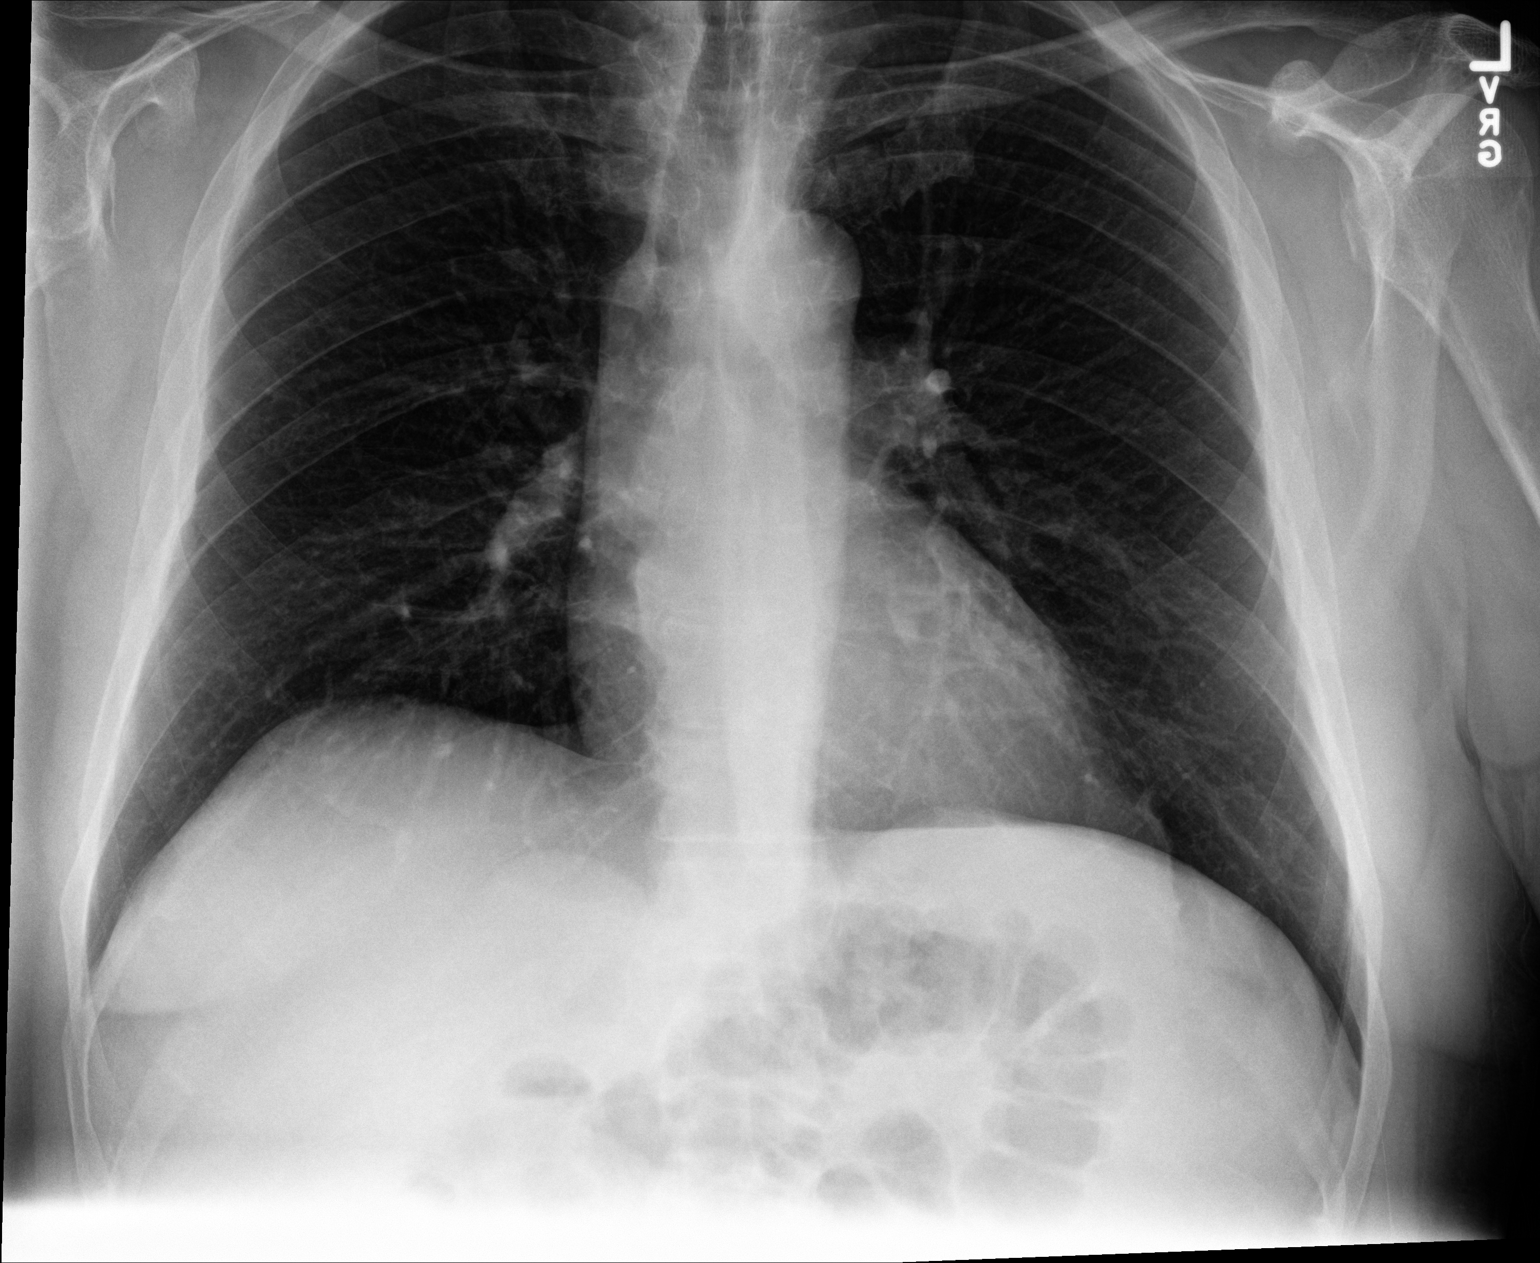

[chest lat]
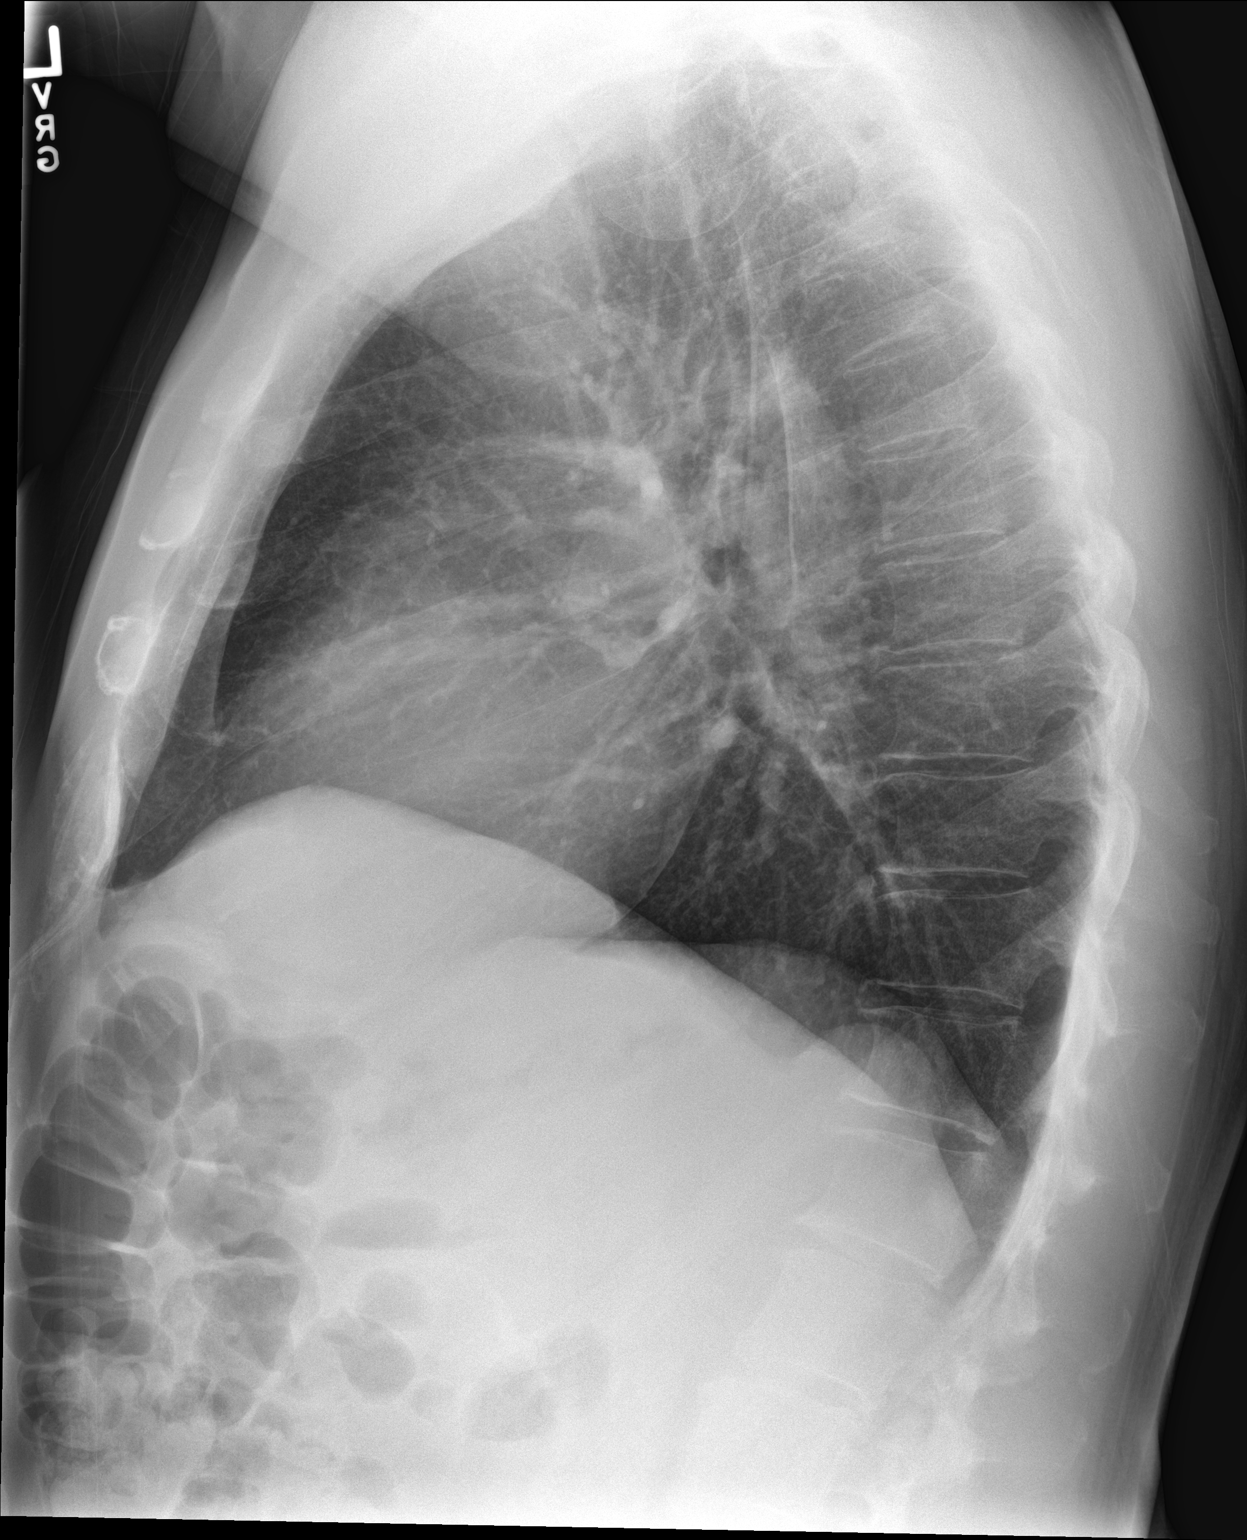

[chest pa (2 of 2)]
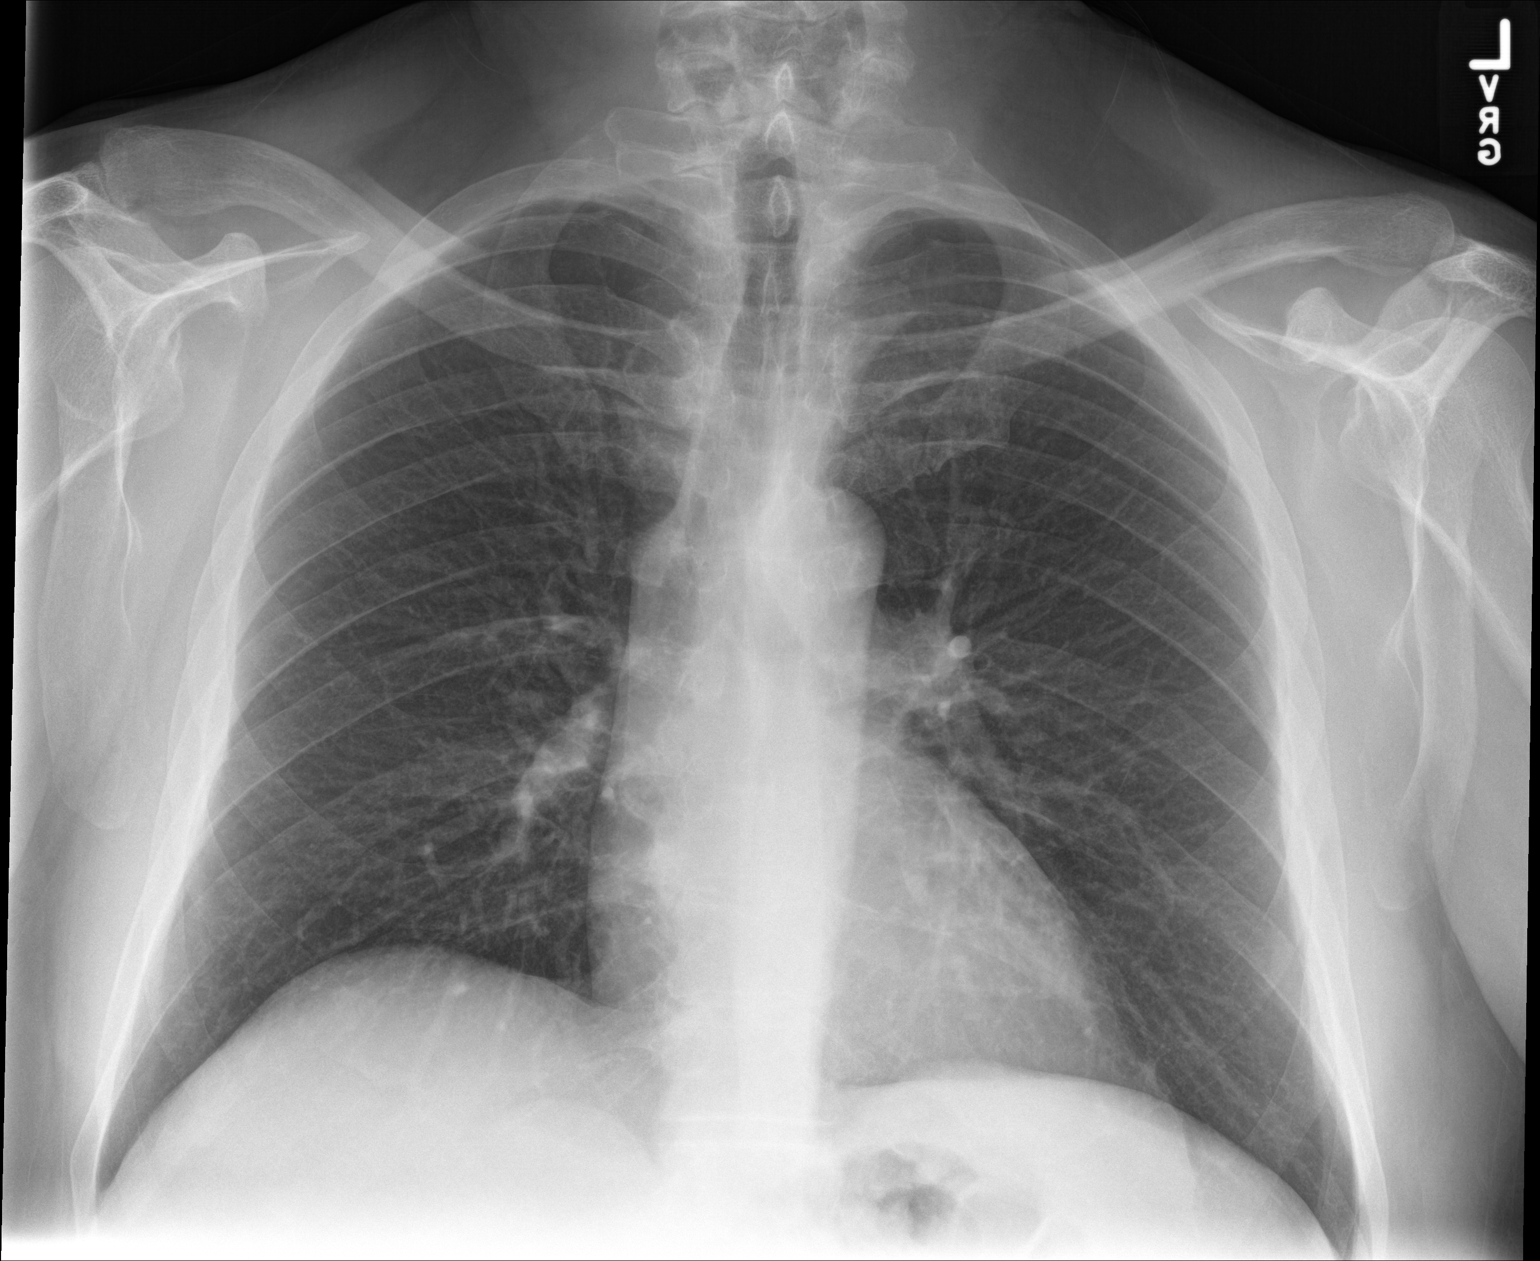

[3 of 3 positions shown; findings below may reference images not displayed]

FINDINGS: The heart size and mediastinal contours are within normal limits.
Both lungs are clear. The visualized skeletal structures are
unremarkable.
IMPRESSION: No active cardiopulmonary disease.

## 2022-12-15 ENCOUNTER — Inpatient Hospital Stay: Payer: BC Managed Care – PPO | Admitting: Oncology

## 2022-12-15 ENCOUNTER — Encounter: Payer: Self-pay | Admitting: Oncology

## 2022-12-15 ENCOUNTER — Inpatient Hospital Stay: Payer: BC Managed Care – PPO | Attending: Oncology

## 2022-12-15 VITALS — BP 136/83 | HR 61 | Temp 97.0°F | Wt 244.7 lb

## 2022-12-15 DIAGNOSIS — D751 Secondary polycythemia: Secondary | ICD-10-CM

## 2022-12-15 DIAGNOSIS — F17210 Nicotine dependence, cigarettes, uncomplicated: Secondary | ICD-10-CM | POA: Diagnosis not present

## 2022-12-15 LAB — CBC WITH DIFFERENTIAL/PLATELET
Abs Immature Granulocytes: 0.04 10*3/uL (ref 0.00–0.07)
Basophils Absolute: 0.1 10*3/uL (ref 0.0–0.1)
Basophils Relative: 1 %
Eosinophils Absolute: 0.3 10*3/uL (ref 0.0–0.5)
Eosinophils Relative: 4 %
HCT: 48.1 % (ref 39.0–52.0)
Hemoglobin: 16.4 g/dL (ref 13.0–17.0)
Immature Granulocytes: 1 %
Lymphocytes Relative: 28 %
Lymphs Abs: 2.5 10*3/uL (ref 0.7–4.0)
MCH: 30.8 pg (ref 26.0–34.0)
MCHC: 34.1 g/dL (ref 30.0–36.0)
MCV: 90.2 fL (ref 80.0–100.0)
Monocytes Absolute: 0.5 10*3/uL (ref 0.1–1.0)
Monocytes Relative: 6 %
Neutro Abs: 5.3 10*3/uL (ref 1.7–7.7)
Neutrophils Relative %: 60 %
Platelets: 238 10*3/uL (ref 150–400)
RBC: 5.33 MIL/uL (ref 4.22–5.81)
RDW: 13.2 % (ref 11.5–15.5)
WBC: 8.7 10*3/uL (ref 4.0–10.5)
nRBC: 0 % (ref 0.0–0.2)

## 2022-12-15 NOTE — Progress Notes (Signed)
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  Of note patient has had an elevated hemoglobin dating back to 2016 and his yearly hemoglobin trends have been 17.8, 18.8, 19.3 and then most recently 20.1 on 01/24/2019.  Hemoglobin and platelet counts have been normal.  He does have a chronic smoking history and currently smokes 1 pack/day and has been doing so for the last 30 years.  He denies any exogenous testosterone use.  He does report getting a good night sleep and wakes up in the morning feeling refreshed but does note that his partner has noticed that he snores at night.  He does not have any known chronic lung disease.    EPO level was normal. JAK2 mutation testing was negative. UA showed no hematuria  Interval history-patient is doing well presently.  He continues to smoke.  No new complaints at this time  ECOG PS- 1 Pain scale- 0 Opioid associated constipation- no  Review of systems- Review of Systems  Constitutional:  Negative for chills, fever, malaise/fatigue and weight loss.  HENT:  Negative for congestion, ear discharge and nosebleeds.   Eyes:  Negative for blurred vision.  Respiratory:  Negative for cough, hemoptysis, sputum production, shortness of breath and wheezing.    Cardiovascular:  Negative for chest pain, palpitations, orthopnea and claudication.  Gastrointestinal:  Negative for abdominal pain, blood in stool, constipation, diarrhea, heartburn, melena, nausea and vomiting.  Genitourinary:  Negative for dysuria, flank pain, frequency, hematuria and urgency.  Musculoskeletal:  Negative for back pain, joint pain and myalgias.  Skin:  Negative for rash.  Neurological:  Negative for dizziness, tingling, focal weakness, seizures, weakness and headaches.  Endo/Heme/Allergies:  Does not bruise/bleed easily.  Psychiatric/Behavioral:  Negative for depression and suicidal ideas. The patient does not have insomnia.       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/2021   Done at [facility]   COLONOSCOPY WITH PROPOFOL N/A 06/18/2019   Procedure: COLONOSCOPY WITH BIOPSY;  Surgeon: Midge Minium, MD;  Location: [facility];  Service: Endoscopy;  Laterality: N/A;   HERNIA REPAIR     HOLEP-LASER ENUCLEATION OF THE PROSTATE WITH MORCELLATION N/A 09/21/2021   Procedure: HOLEP-LASER ENUCLEATION OF THE PROSTATE WITH MORCELLATION;  Surgeon: Vanna Scotland, MD;  Location: [facility];  Service: Urology;  Laterality: N/A;   INGUINAL HERNIA REPAIR Left 10/28/2015   Procedure: HERNIA REPAIR INGUINAL ADULT;  Surgeon: Gladis Riffle, MD;  Location: [facility];  Service: General;  Laterality: Left;   POLYPECTOMY  06/18/2019   Procedure: POLYPECTOMY;  Surgeon: Midge Minium, MD;  Location: [facility];  Service: Endoscopy;;   ROBOT ASSISTED LAPAROSCOPIC PARTIAL COLECTOMY  2022   SKIN SURGERY     Tumor-benign   TONSILLECTOMY AND ADENOIDECTOMY     TUMOR REMOVAL     VENTRAL HERNIA REPAIR   1968   Dr. Cloyd Stagers    Social History   Socioeconomic History   Marital status: Significant Other    Spouse name: Not on file   Number of children: Not on file   Years of education: Not on file   Highest education level: Not on file  Occupational History   Not on file  Tobacco Use   Smoking status: Every Day    Packs/day: 1.50    Years: 30.00    Additional pack years: 0.00    Total pack years: 45.00    Types: Cigarettes    Start date: 10/22/1983    Last attempt to quit: 10/08/2015    Years since quitting: 7.1   Smokeless tobacco: Never   Tobacco comments:    off and on since 1985  Vaping Use   Vaping Use: Never used  Substance and Sexual Activity   Alcohol use: Yes    Alcohol/week: 9.0 standard drinks of alcohol    Types: 1 Cans of beer, 8 Standard drinks or equivalent per week    Comment: pt has hardly any drinks during week and weekends drinks more mostly liquor and some beer   Drug use: Yes    Types: Marijuana    Comment: every now and then about 1 time month   Sexual activity: Yes  Other Topics Concern   Not on file  Social History Narrative   Not on file   Social Determinants of Health   Financial Resource Strain: Low Risk  (12/22/2021)   Overall Financial Resource Strain (CARDIA)    Difficulty of Paying Living Expenses: Not hard at all  Food Insecurity: No Food Insecurity (12/22/2021)   Hunger Vital Sign    Worried About Running Out of Food in the Last Year: Never true    Ran Out of Food in the Last Year: Never true  Transportation Needs: No Transportation Needs (12/22/2021)   PRAPARE - Administrator, Civil Service (Medical): No    Lack of Transportation (Non-Medical): No  Physical Activity: Not on file  Stress: Not on file  Social Connections: Not on file  Intimate Partner Violence: Not At Risk (12/22/2021)   Humiliation, Afraid, Rape, and Kick questionnaire    Fear of Current or Ex-Partner: No    Emotionally Abused: No    Physically Abused:  No    Sexually Abused: No    Family History  Problem Relation Age of Onset   Thyroid disease Mother    Heart disease Father        heart attack   Diabetes Father    Lung cancer Maternal Aunt    Cancer Maternal Aunt        lung cancer   Heart disease Paternal Uncle    Cancer Maternal Grandfather        lung cancer   Prostate cancer Brother 20   Lupus Brother    Prostate cancer Maternal Uncle      Current Outpatient Medications:    aspirin 81 MG EC tablet, Take 81 mg by mouth daily., Disp: , Rfl:    atorvastatin (LIPITOR) 80 MG tablet, Take 1 tablet by mouth daily., Disp: , Rfl:    Cetirizine HCl (ZYRTEC PO), Take by mouth., Disp: ,  Rfl:    fluticasone (FLONASE) 50 MCG/ACT nasal spray, Place 2 sprays into both nostrils daily., Disp: 16 g, Rfl: 6   furosemide (LASIX) 40 MG tablet, Take 20 mg by mouth daily., Disp: , Rfl:    LORazepam (ATIVAN) 0.5 MG tablet, Take 1 tablet (0.5 mg total) by mouth daily as needed for anxiety., Disp: 10 tablet, Rfl: 0   methylPREDNISolone (MEDROL DOSEPAK) 4 MG TBPK tablet, Take by mouth., Disp: , Rfl:    metoprolol succinate (TOPROL-XL) 50 MG 24 hr tablet, Take by mouth., Disp: , Rfl:    Multiple Vitamin (MULTIVITAMIN) capsule, Take 1 capsule by mouth daily., Disp: , Rfl:    omeprazole (PRILOSEC) 10 MG capsule, Take 10 mg by mouth daily., Disp: , Rfl:    sildenafil (REVATIO) 20 MG tablet, Take 1 tablet (20 mg total) by mouth as needed. Take 1-5 tabs as needed prior to intercourse, Disp: 30 tablet, Rfl: 11   spironolactone (ALDACTONE) 25 MG tablet, Take 1 tablet by mouth daily., Disp: , Rfl:    tamsulosin (FLOMAX) 0.4 MG CAPS capsule, tamsulosin 0.4 mg capsule  TAKE 1 CAPSULE BY MOUTH ONCE DAILY, Disp: , Rfl:    MAGNESIUM-OXIDE 400 (240 Mg) MG tablet, Take 1 tablet by mouth daily. (Patient not taking: Reported on 12/15/2022), Disp: , Rfl:    mupirocin ointment (BACTROBAN) 2 %, Apply topically. (Patient not taking: Reported on 12/15/2022), Disp: , Rfl:     simvastatin (ZOCOR) 10 MG tablet, Take 1 tablet (10 mg total) by mouth at bedtime. (Patient not taking: Reported on 12/15/2022), Disp: 90 tablet, Rfl: 3  Physical exam:  Vitals:   12/15/22 1328  BP: 136/83  Pulse: 61  Temp: (!) 97 F (36.1 C)  TempSrc: Tympanic  SpO2: 98%  Weight: 244 lb 11.2 oz (111 kg)   Physical Exam Cardiovascular:     Rate and Rhythm: Normal rate and regular rhythm.     Heart sounds: Normal heart sounds.  Pulmonary:     Effort: Pulmonary effort is normal.     Breath sounds: Normal breath sounds.  Abdominal:     General: Bowel sounds are normal.     Palpations: Abdomen is soft.  Skin:    General: Skin is warm and dry.  Neurological:     Mental Status: He is alert and oriented to person, place, and time.         Latest Ref Rng & Units 01/01/2022   11:50 AM  CMP  Glucose 70 - 99 mg/dL 409   BUN 6 - 24 mg/dL 15   Creatinine 8.11 - 1.27 mg/dL 9.14   Sodium 782 - 956 mmol/L 138   Potassium 3.5 - 5.2 mmol/L 4.3   Chloride 96 - 106 mmol/L 99   CO2 20 - 29 mmol/L 24   Calcium 8.7 - 10.2 mg/dL 9.3   Total Protein 6.0 - 8.5 g/dL 6.5   Total Bilirubin 0.0 - 1.2 mg/dL 0.4   Alkaline Phos 44 - 121 IU/L 174   AST 0 - 40 IU/L 18   ALT 0 - 44 IU/L 27       Latest Ref Rng & Units 12/15/2022   12:42 PM  CBC  WBC 4.0 - 10.5 K/uL 8.7   Hemoglobin 13.0 - 17.0 g/dL 21.3   Hematocrit 08.6 - 52.0 % 48.1   Platelets 150 - 400 K/uL 238      Assessment and plan- Patient is a 57 y.o. male here for routine follow-up of secondary polycythemia due  to smoking  Patient's hemoglobin was elevated at 17.7 back in 2022 but over the last 1 year it has remained stable between 15-16 with a hematocrit less than 50.  He does not require any phlebotomy at this time.  Patient will continue to follow-up with primary care doctor and can be referred to [place] in the future if questions or concerns arise  Patient would qualify for lung cancer screening program but he would like to speak  to his primary care doctor first before considering referral   Visit Diagnosis 1. Secondary polycythemia      Dr. Owens Shark, MD, MPH [facility] [phone] 12/15/2022 1:23 PM

## 2023-08-01 ENCOUNTER — Encounter: Payer: Self-pay | Admitting: Oncology
